# Patient Record
Sex: Male | Born: 1940 | Race: White | Hispanic: No | Marital: Single | Smoking: Former smoker
Health system: Southern US, Community
[De-identification: ages and names within clinical notes are randomized; demographics above are authoritative.]

## PROBLEM LIST (undated history)

## (undated) DIAGNOSIS — N289 Disorder of kidney and ureter, unspecified: Secondary | ICD-10-CM

## (undated) DIAGNOSIS — L219 Seborrheic dermatitis, unspecified: Secondary | ICD-10-CM

## (undated) DIAGNOSIS — I10 Essential (primary) hypertension: Secondary | ICD-10-CM

## (undated) DIAGNOSIS — I38 Endocarditis, valve unspecified: Secondary | ICD-10-CM

## (undated) DIAGNOSIS — F0390 Unspecified dementia without behavioral disturbance: Secondary | ICD-10-CM

## (undated) DIAGNOSIS — Z87891 Personal history of nicotine dependence: Secondary | ICD-10-CM

## (undated) DIAGNOSIS — R001 Bradycardia, unspecified: Secondary | ICD-10-CM

## (undated) DIAGNOSIS — E785 Hyperlipidemia, unspecified: Secondary | ICD-10-CM

## (undated) DIAGNOSIS — R55 Syncope and collapse: Secondary | ICD-10-CM

## (undated) HISTORY — DX: Disorder of kidney and ureter, unspecified: N28.9

## (undated) HISTORY — PX: MULTIPLE TOOTH EXTRACTIONS: SHX2053

## (undated) HISTORY — DX: Essential (primary) hypertension: I10

## (undated) HISTORY — PX: CARDIAC CATHETERIZATION: SHX172

## (undated) HISTORY — DX: Syncope and collapse: R55

## (undated) HISTORY — DX: Bradycardia, unspecified: R00.1

## (undated) HISTORY — DX: Personal history of nicotine dependence: Z87.891

## (undated) HISTORY — DX: Endocarditis, valve unspecified: I38

---

## 1999-09-08 ENCOUNTER — Ambulatory Visit (HOSPITAL_BASED_OUTPATIENT_CLINIC_OR_DEPARTMENT_OTHER): Admission: RE | Admit: 1999-09-08 | Discharge: 1999-09-09 | Payer: Self-pay | Admitting: Surgery

## 2000-10-03 ENCOUNTER — Encounter: Admission: RE | Admit: 2000-10-03 | Discharge: 2001-01-01 | Payer: Self-pay | Admitting: Anesthesiology

## 2001-01-19 ENCOUNTER — Encounter: Admission: RE | Admit: 2001-01-19 | Discharge: 2001-01-19 | Payer: Self-pay | Admitting: Internal Medicine

## 2008-03-04 ENCOUNTER — Encounter: Admission: RE | Admit: 2008-03-04 | Discharge: 2008-03-04 | Payer: Self-pay | Admitting: Internal Medicine

## 2010-03-11 ENCOUNTER — Encounter (INDEPENDENT_AMBULATORY_CARE_PROVIDER_SITE_OTHER): Payer: Self-pay | Admitting: Internal Medicine

## 2010-03-11 ENCOUNTER — Ambulatory Visit (HOSPITAL_COMMUNITY): Admission: RE | Admit: 2010-03-11 | Discharge: 2010-03-11 | Payer: Self-pay | Admitting: Internal Medicine

## 2010-03-11 ENCOUNTER — Ambulatory Visit: Payer: Self-pay | Admitting: Vascular Surgery

## 2010-06-23 ENCOUNTER — Encounter
Admission: RE | Admit: 2010-06-23 | Discharge: 2010-06-23 | Payer: Self-pay | Source: Home / Self Care | Attending: Internal Medicine | Admitting: Internal Medicine

## 2010-10-30 NOTE — H&P (Signed)
[facility]  Patient:    Cody Vincent, Cody Vincent                        MRN: [id number] Attending:  Thyra Breed, M.D. CC:         Candy Sledge, M.D.   History and Physical  NEW PATIENT EVALUATION  DATE OF BIRTH:  1940/12/02  HISTORY OF PRESENT ILLNESS:  Cody Vincent is a very pleasant 70 year old gentleman who is sent to [place] by Dr. Fransisca Connors for possible occipital nerve block.  The patient states that he was in his usual state of health up until about four years ago when he developed intermittent neck discomfort.  He described this as an achy discomfort which would come and go and would not recur for several years between bouts.  He originally stated it started after he took a fall in the bathtub related to what was described as "abdominal epilepsy".  He apparently passed out in the bathtub.  Over the past four to eight weeks, he has developed severe pain in his neck with no history of antecedent trauma.  It is associated with crepitus.  It is associated with a stiffness.  He notes that intermittently he has a dull ache throughout his scalp with associated shooting pains whenever he moves his head from side to side.  He denies any numbness or tingling in the lower extremities, but he does have numbness and tingling in the right upper extremity to a greater extent than the left upper extremity in a nondermatomal distribution.  He denied bowel or bladder incontinence.  He states when he gets up and walks sometimes he has poor balance.  He has had no more episodes of passing out since 1998.  Associated with his headache, he has neck discomfort.  He has no associated sweating of his forehead, eye injection, throbbing of the scalp of jaw mastication.  He no swallowing problems.  He denies any excess runny nose.  He notes it is made worse by rotating his head from side to side or flexing from an extended position or vice versa.  He does note that  Vicodin will help to a mild extent.  He has been tried on Neurontin which was not helpful, and he has tried Aleve as well as Ibuprofen with no benefits.  CURRENT MEDICATIONS:  Vicodin and he has been on Aleve as well as Ibuprofen in the past, and he is off of these now.  ALLERGIES:  NOVOCAINE causes "blackouts" as well as IVP DYE.  FAMILY HISTORY:  Positive for coronary artery disease, cancer, and osteoarthritis.  ACTIVE MEDICAL PROBLEMS:  Question of seizure disorder and recurrent bladder problems.  PAST SURGICAL HISTORY:  Significant for back surgery in 1985, hernia surgeries x 2, and a bladder surgery for ruptured cyst in the bladder.  SOCIAL HISTORY:  The patient is a nonsmoker and nondrinker.  He works at [employer].  REVIEW OF SYSTEMS:  HEAD:  Significant for sinus headaches in addition to the cervicogenic headaches, but no migraines.  EYES:  Positive for glasses. NOSE/MOUTH/THROAT:  Negative.  EARS:  Significant for decreased hearing acuity.  PULMONARY:  Negative.  CARDIOVASCULAR:  Negative.  GI:  Significant for gastroesophageal reflux disease, otherwise negative.  GU:  See active medical problems in past surgical history.  MUSCULOSKELETAL:  The patient has a history of carpal tunnel syndrome in the right upper extremity and tendonitis in the hands, otherwise negative.  NEUROLOGIC:  Positive  history of seizure disorder, negative for strokes.  HEMATOLOGIC:  Negative.  CUTANEOUS: Significant for a rash which occurred when his significant other changed detergents.  ENDOCRINE:  Negative.  PSYCHIATRIC:  Negative. ALLERGY/IMMUNOLOGIC:  Positive for hay fever.  PHYSICAL EXAMINATION:  VITAL SIGNS:  Blood pressure 150/71, heart rate 37, respiratory rate 18, and O2 saturation 100%.  Pain level 5/10.  GENERAL:  This is a pleasant male in no acute distress.  HEAD:  Normocephalic, atraumatic.  EYES:  Extraocular movements intact. Conjunctivae and sclerae clear.  NOSE:  Patent nares.   Oropharynx is free of lesions.  NECK:  Demonstrated mild restriction in range of motion with crepitus on range of motion.  Carotids are 2+ and symmetric without bruits.  LUNGS:  Clear.  HEART:  Regular rate and rhythm.  ABDOMINAL/GENITALIA/RECTAL:  Exams were not performed.  BACK EXAM:  Revealed well-healed surgical scar.  Straight leg raise signs were negative.  EXTREMITIES:  No clubbing, cyanosis, or edema with radial pulse and dorsalis pedis pulses 2+ and symmetric.  NEUROLOGIC:  The patient was oriented x 4.  Cranial nerves II-XII were grossly intact.  Deep tendon reflexes were symmetric in the upper extremities and lower extremities with downgoing toes.  Motor was 5/5 with symmetric bulk and tone.  Coordination was grossly intact.  Sensory exam was intact to pinprick and vibratory sense.  X-RAYS:  The patient had a rhythm strip run because of a slow heart rate.  His heart rate at rest is 40.  When he is asked to do stool x 5 his heart rate will go up to 68.  An MRI was interpreted by Dr. 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  IMPRESSION: 1. Neck pain and headaches on the basis of cervical spondylosis. 2. Bradycardia with first degree heart block which is responsive to exercise,    probably normal variant. 3. History of hay fever. 4. Questionable history of seizure disorder. 5. Bladder problem, poorly defined. 6. Decreased hearing acuity. 7. Mild gastroesophageal reflux disease.  DISPOSITION:  The patient describes a vague history of allergies to local anesthetics.  He cannot tell me who has given him local anesthetics in the past to check this out in greater detail.  I advised him that it was inadvisable to proceed with a nerve block today.  As an alternative, I advised him and his significant other as to how to ice down the occipital grooves to  numb  the back of the head.  They need to apply ice for 20 minutes each day and this may be as effective in putting the nerve in check and is well worth a trial.  In addition, I gave them some samples of Vioxx 25 mg 1 p.o. q.d. to try to see whether this will reduce some of the symptoms.  They plan to see Dr. Noreene Filbert back next month.  I encouraged them to establish with a primary care physician, but at this time I advised them that I would hold off any further injections until we can define the allergies to the local anesthetics in better detail.  There questions were answered.  They were advised to stop the Vioxx if they noted any problems with it. DD:  10/04/00 TD:  10/04/00 Job: [id number]

## 2010-10-30 NOTE — Op Note (Signed)
[facility]  Patient:    Cody Vincent, Cody Vincent                      MRN: [id number] Proc. Date: 09/08/99 Adm. Date:  [id number] Attending:  Abigail Miyamoto A                           Operative Report  PREOPERATIVE DIAGNOSIS:  Right inguinal hernia.  POSTOPERATIVE DIAGNOSIS:  Right inguinal hernia.  PROCEDURE:  Right inguinal hernia repair with mesh.  SURGEON:  Abigail Miyamoto, M.D.  ANESTHESIA:  General endotracheal anesthesia and 0.25% Marcaine plain.  ESTIMATED BLOOD LOSS:  Minimal.  DESCRIPTION OF PROCEDURE:  The patient was brought to the operating room and identified as [name].  He was placed supine on the operating room table nd general anesthesia was induced.  His right groin was then prepped and draped in the usual sterile fashion.  Using a #10 blade, a small longitudinal incision was made in the patients right lower abdomen.  The incision was carried down through the  Scarpas fascia with electrocautery.  The external oblique fascia was then identified, opened with the scalpel ________ toward the internal ring with the Metzenbaum scissors.  The testicular cord structures were then identified and controlled circumferentially with a Penrose drain.  The cord was then examined nd a large indirect inguinal hernia sac was identified.  The sac was separated from the remaining cord structures.  The sac was then opened.  No bowel was found to be protruding into the sac.  The base of the sac was then tied off with a 3-0 silk  pursestring suture.  The redundant remaining sac was then excised with the electrocautery.  Next, a 3 x 6 piece of Prolene mesh _________ fashion appropriately, and then split down the middle.  The mesh was then sewn in place to the pubic tubercle with two separate 2-0 Prolene sutures.  The mesh was then sewn in circumferentially around the testicular cord by sewing it to the transversalis fascia  medially and laterally to the shelving edge of the inguinal ligament, and then bringing it laterally around the cord structures.  Once the mesh was in place, the wound was then thoroughly irrigated with normal  saline.  Hemostasis appeared to be achieved.  The external oblique fascia was then closed with a running 2-0 Vicryl suture.  The Scarpas fascia was then reapproximated with 3-0 Vicryl sutures, and the skin was closed with a running  4-0 Vicryl sutures.  Steri-Strips, gauze, and Tegaderm were then applied.  The patient tolerated the procedure well.  All sponge, needle, and instrument counts were correct at the end of the procedure.  The patient was then extubated in the operating room and taken in stable condition to the recovery room. DD:  09/08/99 TD:  09/08/99 Job: [id number]

## 2012-03-05 DIAGNOSIS — G459 Transient cerebral ischemic attack, unspecified: Secondary | ICD-10-CM

## 2012-03-06 DIAGNOSIS — I498 Other specified cardiac arrhythmias: Secondary | ICD-10-CM

## 2012-03-06 DIAGNOSIS — G459 Transient cerebral ischemic attack, unspecified: Secondary | ICD-10-CM

## 2012-03-08 DIAGNOSIS — I498 Other specified cardiac arrhythmias: Secondary | ICD-10-CM

## 2012-03-20 ENCOUNTER — Ambulatory Visit (INDEPENDENT_AMBULATORY_CARE_PROVIDER_SITE_OTHER): Payer: PRIVATE HEALTH INSURANCE | Admitting: Internal Medicine

## 2012-03-20 ENCOUNTER — Encounter: Payer: Self-pay | Admitting: *Deleted

## 2012-03-20 ENCOUNTER — Encounter: Payer: Self-pay | Admitting: Internal Medicine

## 2012-03-20 VITALS — BP 130/50 | HR 43 | Ht 68.0 in | Wt 232.0 lb

## 2012-03-20 DIAGNOSIS — E782 Mixed hyperlipidemia: Secondary | ICD-10-CM

## 2012-03-20 DIAGNOSIS — I359 Nonrheumatic aortic valve disorder, unspecified: Secondary | ICD-10-CM

## 2012-03-20 DIAGNOSIS — I351 Nonrheumatic aortic (valve) insufficiency: Secondary | ICD-10-CM

## 2012-03-20 MED ORDER — ATORVASTATIN CALCIUM 20 MG PO TABS: 20.0000 mg | ORAL_TABLET | Freq: Every day | ORAL | Status: DC

## 2012-03-20 NOTE — Progress Notes (Signed)
HPI [place] admitted recently to [facility] hosp for TIA Patient at church  Legs felt weak.  Went to car.  Couldn't move legs, arms  Couldn't talk  Church member called 911 Patient taken to hosp  Moving a little. Not dizzy until started coming out of it patient says.  No episode like that in past.  At [facility],  echo and carotid USN done.  Echo showed mild to moderate AI.  Aortic root 41 mm  Mild MR  Mild LAE  Normal LVEF    Carotd USN showed mild plaquing seen in carotid artery  Per patient's report he had another spell in hospital.    LDL was 167; HDL 45.  SInce d/c no further spells. No Known Allergies  Current Outpatient Prescriptions  Medication Sig Dispense Refill  . aspirin 81 MG tablet Take 81 mg by mouth daily.      Marland Kitchen lisinopril-hydrochlorothiazide (PRINZIDE,ZESTORETIC) 10-12.5 MG per tablet Take 1 tablet by mouth daily.      . traMADol (ULTRAM) 50 MG tablet Take 50 mg by mouth every 6 (six) hours as needed.        No past medical history on file.  No past surgical history on file.  No family history on file.  History   Social History  . Marital Status: Single    Spouse Name: N/A    Number of Children: N/A  . Years of Education: N/A   Occupational History  . Not on file.   Social History Main Topics  . Smoking status: Current Every Day Smoker    Types: Cigarettes  . Smokeless tobacco: Not on file   Comment: use to smoke 3 packs per day for 6 years quit in 1993  . Alcohol Use: Not on file  . Drug Use: Not on file  . Sexually Active: Not on file   Other Topics Concern  . Not on file   Social History Narrative  . No narrative on file    Review of Systems:  All systems reviewed.  They are negative to the above problem except as previously stated.  Vital Signs: BP 130/50  Pulse 43  Ht 5\' 8"  (1.727 m)  Wt 232 lb (105.235 kg)  BMI 35.28 kg/m2  Physical Exam Patient is in NAD HEENT:  Normocephalic, atraumatic. EOMI, PERRLA.  Neck: JVP is normal.  No  bruits.  Lungs: clear to auscultation. No rales no wheezes.  Heart: Regular rate and rhythm. Normal S1, S2. No S3.   No significant murmurs. PMI not displaced.  Abdomen:  Supple, nontender. Normal bowel sounds. No masses. No hepatomegaly.  Extremities:   Good distal pulses throughout. No lower extremity edema.  Musculoskeletal :moving all extremities.  Neuro:   alert and oriented x3.  CN II-XII grossly intact.  EKG  Sinus bradycardia   43 bpm.   Assessment and Plan:  1. Question TIA>  Unusual  Was it a TIA?  Patient does not know if he has seen neuro or if he has  f/u in neuro.  WOrk up so far was negative so far.   WIll defer to primary to confirm f/u From cardiac standpoint found to be bradycardia.  But no evidence that symptomatic from this.  No other arrhythmia documented. Hemodynamics are good.   I would recomm following clinically.  I would not schedule further testing for now.  2.  Aortic insuff.  WIll need f/u  Repeat echo in 1 year to assess this as well as aortic root.  3.  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  Will begin Lipitor 20  F/U in 8 wks.  4.  HTN.  WIll check BMET   Continue meds.

## 2012-03-20 NOTE — Patient Instructions (Addendum)
Your physician recommends that you schedule a follow-up appointment in: 1 year  Your physician has requested that you have an echocardiogram, in 1 year, prior to your follow up visit. Echocardiography is a painless test that uses sound waves to create images of your heart. It provides your doctor with information about the size and shape of your heart and how well your heart's chambers and valves are working. This procedure takes approximately one hour. There are no restrictions for this procedure.  Your physician recommends that you return for lab work in: Tomorrow and in 2 months  Your physician has recommended you make the following change in your medication:  1 - START Lipitor 20 mg daily

## 2012-03-21 LAB — BASIC METABOLIC PANEL
BUN: 22 mg/dL (ref 6–23)
CO2: 27 mEq/L (ref 19–32)
Calcium: 9.3 mg/dL (ref 8.4–10.5)
Creat: 1.27 mg/dL (ref 0.50–1.35)
Glucose, Bld: 96 mg/dL (ref 70–99)

## 2012-03-23 ENCOUNTER — Telehealth: Payer: Self-pay | Admitting: *Deleted

## 2012-03-23 NOTE — Telephone Encounter (Signed)
Advised patient of lab results  

## 2012-03-23 NOTE — Telephone Encounter (Signed)
Message copied by Burnell Blanks on Thu Mar 23, 2012  8:58 AM ------      Message from: Dietrich Pates V      Created: Wed Mar 22, 2012  7:53 PM       Electrolytes are normal.

## 2012-05-01 ENCOUNTER — Encounter: Payer: Self-pay | Admitting: Internal Medicine

## 2012-05-15 ENCOUNTER — Other Ambulatory Visit: Payer: Self-pay | Admitting: *Deleted

## 2012-05-15 ENCOUNTER — Telehealth: Payer: Self-pay | Admitting: *Deleted

## 2012-05-15 DIAGNOSIS — E782 Mixed hyperlipidemia: Secondary | ICD-10-CM

## 2012-05-15 DIAGNOSIS — I351 Nonrheumatic aortic (valve) insufficiency: Secondary | ICD-10-CM

## 2012-05-15 LAB — AST: AST: 20 U/L (ref 0–37)

## 2012-05-15 NOTE — Telephone Encounter (Signed)
Received incoming call from [place] with [facility] to advise pt is in office for lab draw and no orders have been placed/brought in noted pt was advised by Tenny Craw to have labs approximately 05-22-12 for Lipid panal and AST, released orders and faxed times 2 to [facility], note reminder in chart to review that the collection/results were sent to MD for review.

## 2012-05-16 ENCOUNTER — Encounter: Payer: Self-pay | Admitting: *Deleted

## 2012-05-16 ENCOUNTER — Other Ambulatory Visit: Payer: Self-pay | Admitting: *Deleted

## 2012-05-16 DIAGNOSIS — E782 Mixed hyperlipidemia: Secondary | ICD-10-CM

## 2012-05-16 MED ORDER — EZETIMIBE-ATORVASTATIN 10-20 MG PO TABS: 1.0000 | ORAL_TABLET | Freq: Every morning | ORAL | Status: DC

## 2012-05-19 ENCOUNTER — Other Ambulatory Visit: Payer: Self-pay | Admitting: *Deleted

## 2012-05-19 ENCOUNTER — Encounter: Payer: Self-pay | Admitting: *Deleted

## 2012-05-19 DIAGNOSIS — E782 Mixed hyperlipidemia: Secondary | ICD-10-CM

## 2012-07-11 ENCOUNTER — Other Ambulatory Visit: Payer: Self-pay | Admitting: Internal Medicine

## 2012-07-12 LAB — LIPID PANEL
Cholesterol: 127 mg/dL (ref 0–200)
HDL: 39 mg/dL — ABNORMAL LOW (ref >39)
Triglycerides: 126 mg/dL (ref <150)

## 2012-07-12 LAB — AST: AST: 15 U/L (ref 0–37)

## 2012-07-21 ENCOUNTER — Other Ambulatory Visit: Payer: Self-pay | Admitting: *Deleted

## 2012-07-21 DIAGNOSIS — E785 Hyperlipidemia, unspecified: Secondary | ICD-10-CM

## 2012-07-21 MED ORDER — EZETIMIBE-ATORVASTATIN 10-10 MG PO TABS: 1.0000 | ORAL_TABLET | Freq: Every day | ORAL | Status: DC

## 2013-02-08 ENCOUNTER — Other Ambulatory Visit: Payer: Self-pay | Admitting: Internal Medicine

## 2013-02-18 DIAGNOSIS — I498 Other specified cardiac arrhythmias: Secondary | ICD-10-CM

## 2013-02-18 DIAGNOSIS — R079 Chest pain, unspecified: Secondary | ICD-10-CM

## 2013-02-19 DIAGNOSIS — R072 Precordial pain: Secondary | ICD-10-CM

## 2013-03-14 ENCOUNTER — Encounter: Payer: Self-pay | Admitting: Cardiology

## 2013-03-16 ENCOUNTER — Encounter: Payer: Self-pay | Admitting: *Deleted

## 2013-03-16 ENCOUNTER — Ambulatory Visit (INDEPENDENT_AMBULATORY_CARE_PROVIDER_SITE_OTHER): Payer: PRIVATE HEALTH INSURANCE | Admitting: Internal Medicine

## 2013-03-16 ENCOUNTER — Other Ambulatory Visit: Payer: Self-pay | Admitting: *Deleted

## 2013-03-16 ENCOUNTER — Encounter: Payer: Self-pay | Admitting: Internal Medicine

## 2013-03-16 ENCOUNTER — Ambulatory Visit (INDEPENDENT_AMBULATORY_CARE_PROVIDER_SITE_OTHER): Payer: PRIVATE HEALTH INSURANCE | Admitting: Cardiovascular Disease

## 2013-03-16 VITALS — BP 119/69 | HR 42 | Ht 68.0 in | Wt 215.0 lb

## 2013-03-16 DIAGNOSIS — I10 Essential (primary) hypertension: Secondary | ICD-10-CM

## 2013-03-16 DIAGNOSIS — R001 Bradycardia, unspecified: Secondary | ICD-10-CM | POA: Insufficient documentation

## 2013-03-16 DIAGNOSIS — R55 Syncope and collapse: Secondary | ICD-10-CM

## 2013-03-16 DIAGNOSIS — I359 Nonrheumatic aortic valve disorder, unspecified: Secondary | ICD-10-CM

## 2013-03-16 DIAGNOSIS — I498 Other specified cardiac arrhythmias: Secondary | ICD-10-CM

## 2013-03-16 DIAGNOSIS — I77810 Thoracic aortic ectasia: Secondary | ICD-10-CM

## 2013-03-16 DIAGNOSIS — I351 Nonrheumatic aortic (valve) insufficiency: Secondary | ICD-10-CM

## 2013-03-16 NOTE — Patient Instructions (Addendum)
Your physician recommends that you schedule a follow-up appointment in: 6 weeks. Your physician recommends that you continue on your current medications as directed. Please refer to the Current Medication list given to you today. Your physician has requested that you have an echocardiogram. Echocardiography is a painless test that uses sound waves to create images of your heart. It provides your doctor with information about the size and shape of your heart and how well your heart's chambers and valves are working. This procedure takes approximately one hour. There are no restrictions for this procedure. You were referred to Dr. Johney Frame today.

## 2013-03-16 NOTE — Patient Instructions (Addendum)
Your physician has recommended that you have a pacemaker inserted. A pacemaker is a small device that is placed under the skin of your chest or abdomen to help control abnormal heart rhythms. This device uses electrical pulses to prompt the heart to beat at a normal rate. Pacemakers are used to treat heart rhythms that are too slow. Wire (leads) are attached to the pacemaker that goes into the chambers of you heart. This is done in the hospital and usually requires and overnight stay. Please see the instruction sheet given to you today for more information. NO DRIVING AT ALL UNTIL FURTHER NOTICE.

## 2013-03-16 NOTE — Progress Notes (Signed)
Patient ID: Cody Vincent, male   DOB: Feb 11, 1941, 73 y.o.   MRN: [id number]      SUBJECTIVE: Cody Vincent was discharged from [facility] today, with a diagnosis of syncope due to symptomatic sinus bradycardia. It was recommended he have a pacemaker by the consulting cardiologist during his hospitalization. He has a questionable h/o a CVA (may have also been a pre-syncopal episode due to symptomatic sinus bradycardia, HR 37-42 bpm), HTN, and hyperlipidemia. He lives alone in a trailer. He had been walking and began to feel extremely weak, particularly in his legs, and then passed out. It was preceded by lightheadedness and dizziness. He doesn't know how long he was unconscious.  A friend tried to call him multiple times and when he didn't answer, he called the patient's pastor who attended to him. By the time he got to the ER, his HR ranged between 50-66 bpm, with an ECG revealing sinus bradycardia with an HR of 52 bpm. He ruled out for an ACS with serial troponins. He underwent a V/Q scan which was low probability for pulmonary embolus.  He recently had a stress test on 02-19-13 (which I interpreted) which revealed reverse redistribution of the inferior wall, indicative of soft tissue attenuation. There was also a small size, mild intensity, predominantly fixed mid-anterior wall defect with minimal reversibility, which could have represented scar with minimal peri-infarct ischemia vs motion and soft tissue attenuation artifact. Wall motion was normal which favored artifact rather than ischemia. LVEF was 59%.  An echocardiogram on 03-06-12 revealed normal LV systolic function, EF 60-65%, mild to moderate LVH, diastolic dysfunction, mild MR, mild to moderate AI, mild dilatation of the aortic root, and a small PFO.  He was evaluated by Dr. Antoine Poche at [facility] on 03-06-2012 for a near syncopal episode and was noted to be bradycardic at that time, but was able to increase his HR to the 60-70 bpm range doing exercises at  the bedside.  Today his HR is 42 bpm.    No Known Allergies  Current Outpatient Prescriptions  Medication Sig Dispense Refill  . aspirin 81 MG tablet Take 81 mg by mouth daily.      Marland Kitchen atorvastatin (LIPITOR) 10 MG tablet TAKE 1 TABLET DAILY  90 tablet  0  . lisinopril-hydrochlorothiazide (PRINZIDE,ZESTORETIC) 10-12.5 MG per tablet Take 1 tablet by mouth daily.      . tamsulosin (FLOMAX) 0.4 MG CAPS capsule Take 0.4 mg by mouth daily after supper.       . traMADol (ULTRAM) 50 MG tablet Take 50 mg by mouth every 6 (six) hours as needed.       No current facility-administered medications for this visit.    Past Medical History  Diagnosis Date  . Near syncope   . Sinus bradycardia   . Diastolic murmur   . Hypertension   . Renal insufficiency   . History of tobacco abuse     No past surgical history on file.  History   Social History  . Marital Status: Single    Spouse Name: N/A    Number of Children: N/A  . Years of Education: N/A   Occupational History  . Not on file.   Social History Main Topics  . Smoking status: Former Smoker -- 3.00 packs/day for 39 years    Types: Cigarettes    Quit date: 06/14/1996  . Smokeless tobacco: Not on file     Comment: use to smoke 3 packs per day for 6 years quit in 1993  .  Alcohol Use: Not on file  . Drug Use: Not on file  . Sexual Activity: Not on file   Other Topics Concern  . Not on file   Social History Narrative  . No narrative on file     Filed Vitals:   03/16/13 1119  BP: 119/69  Pulse: 42  Height: 5\' 8"  (1.727 m)  Weight: 215 lb (97.523 kg)    PHYSICAL EXAM General: NAD Neck: No JVD, no thyromegaly or thyroid nodule.  Lungs: Clear to auscultation bilaterally with normal respiratory effort. CV: Nondisplaced PMI.  Heart regular rhythm, HR 40 bpm, normal S1/S2, no S3/S4, II/IV holodiastolic murmur at LLSB.  No peripheral edema.  No carotid bruit.  Normal pedal pulses.  Abdomen: Soft, nontender, no  hepatosplenomegaly, no distention.  Neurologic: Alert and oriented x 3.  Psych: Normal affect. Extremities: No clubbing or cyanosis.   ECG: reviewed and available in electronic records.      ASSESSMENT AND PLAN: 1. Syncope in the setting of sinus bradycardia: I've done an extensive review of the medical records regarding both his hospitalizations from 02/2012 as well as his most recent one. He is not on any rate-slowing agents. His TSH and other pertinent labs were normal. His recent stress test showed no significant degree of ischemia. He has symptomatic sinus bradycardia and warrants pacemaker placement. I have discussed this with Dr. Johney Frame who is also here in [place] today, and he concurs, and has agreed to implant a pacemaker this Monday, 03/19/13. I will follow up with him thereafter. 2. Aortic regurgitation and aortic root dilatation: will obtain an echocardiogram for surveillance purposes. 3. Hyperlipidemia: on Lipitor 10 mg daily. Will check to see if lipids were recently done. 4. HTN: controlled on current therapy.   Prentice Docker, M.D., F.A.C.C.

## 2013-03-18 MED ORDER — SODIUM CHLORIDE 0.9 % IR SOLN
80.0000 mg | Status: DC
  Filled 2013-03-18 (×2): qty 2

## 2013-03-18 MED ORDER — SODIUM CHLORIDE 0.9 % IV SOLN
INTRAVENOUS | Status: DC
  Administered 2013-03-19: 08:00:00 via INTRAVENOUS

## 2013-03-18 MED ORDER — CHLORHEXIDINE GLUCONATE 4 % EX LIQD
60.0000 mL | Freq: Once | CUTANEOUS | Status: DC
  Filled 2013-03-18: qty 60

## 2013-03-18 MED ORDER — CEFAZOLIN SODIUM-DEXTROSE 2-3 GM-% IV SOLR
2.0000 g | INTRAVENOUS | Status: DC
  Filled 2013-03-18 (×2): qty 50

## 2013-03-19 ENCOUNTER — Encounter (HOSPITAL_COMMUNITY)
Admission: RE | Disposition: A | Discharge status: Home / Self Care | Payer: Self-pay | Source: Ambulatory Visit | Attending: Internal Medicine

## 2013-03-19 ENCOUNTER — Ambulatory Visit (HOSPITAL_COMMUNITY)
Admission: RE | Admit: 2013-03-19 | Discharge: 2013-03-20 | Disposition: A | Discharge status: Home / Self Care | Payer: PRIVATE HEALTH INSURANCE | Source: Ambulatory Visit | Attending: Internal Medicine | Admitting: Internal Medicine

## 2013-03-19 ENCOUNTER — Encounter (HOSPITAL_COMMUNITY): Payer: Self-pay | Admitting: Pharmacy Technician

## 2013-03-19 DIAGNOSIS — R001 Bradycardia, unspecified: Secondary | ICD-10-CM | POA: Diagnosis present

## 2013-03-19 DIAGNOSIS — I10 Essential (primary) hypertension: Secondary | ICD-10-CM | POA: Insufficient documentation

## 2013-03-19 DIAGNOSIS — E119 Type 2 diabetes mellitus without complications: Secondary | ICD-10-CM | POA: Insufficient documentation

## 2013-03-19 DIAGNOSIS — I495 Sick sinus syndrome: Secondary | ICD-10-CM

## 2013-03-19 DIAGNOSIS — Z79899 Other long term (current) drug therapy: Secondary | ICD-10-CM | POA: Insufficient documentation

## 2013-03-19 DIAGNOSIS — R55 Syncope and collapse: Secondary | ICD-10-CM | POA: Diagnosis present

## 2013-03-19 DIAGNOSIS — N289 Disorder of kidney and ureter, unspecified: Secondary | ICD-10-CM | POA: Insufficient documentation

## 2013-03-19 HISTORY — PX: PACEMAKER INSERTION: SHX728

## 2013-03-19 HISTORY — PX: PERMANENT PACEMAKER INSERTION: SHX5480

## 2013-03-19 LAB — CBC
HCT: 36.6 % — ABNORMAL LOW (ref 39.0–52.0)
Hemoglobin: 12.8 g/dL — ABNORMAL LOW (ref 13.0–17.0)
MCH: 30.5 pg (ref 26.0–34.0)
MCHC: 35 g/dL (ref 30.0–36.0)
Platelets: 171 10*3/uL (ref 150–400)
RBC: 4.2 MIL/uL — ABNORMAL LOW (ref 4.22–5.81)
RDW: 13.6 % (ref 11.5–15.5)

## 2013-03-19 LAB — BASIC METABOLIC PANEL
BUN: 29 mg/dL — ABNORMAL HIGH (ref 6–23)
Calcium: 9.1 mg/dL (ref 8.4–10.5)
GFR calc non Af Amer: 41 mL/min — ABNORMAL LOW (ref >90)
Glucose, Bld: 115 mg/dL — ABNORMAL HIGH (ref 70–99)
Potassium: 3.7 mEq/L (ref 3.5–5.1)
Sodium: 139 mEq/L (ref 135–145)

## 2013-03-19 LAB — SURGICAL PCR SCREEN: MRSA, PCR: NEGATIVE

## 2013-03-19 SURGERY — PERMANENT PACEMAKER INSERTION
Anesthesia: LOCAL

## 2013-03-19 MED ORDER — TAMSULOSIN HCL 0.4 MG PO CAPS
0.4000 mg | ORAL_CAPSULE | Freq: Every day | ORAL | Status: DC
  Administered 2013-03-19: 0.4 mg via ORAL
  Filled 2013-03-19 (×2): qty 1

## 2013-03-19 MED ORDER — SODIUM CHLORIDE 0.9 % IJ SOLN: 3.0000 mL | INTRAMUSCULAR | Status: DC | PRN

## 2013-03-19 MED ORDER — CEFAZOLIN SODIUM 1-5 GM-% IV SOLN
1.0000 g | Freq: Four times a day (QID) | INTRAVENOUS | Status: DC
  Filled 2013-03-19 (×2): qty 50

## 2013-03-19 MED ORDER — ATORVASTATIN CALCIUM 10 MG PO TABS
10.0000 mg | ORAL_TABLET | Freq: Every day | ORAL | Status: DC
  Administered 2013-03-19 – 2013-03-20 (×2): 10 mg via ORAL
  Filled 2013-03-19 (×2): qty 1

## 2013-03-19 MED ORDER — TRAMADOL HCL 50 MG PO TABS
50.0000 mg | ORAL_TABLET | ORAL | Status: DC | PRN
Start: 2013-03-19 — End: 2013-03-20

## 2013-03-19 MED ORDER — HEPARIN (PORCINE) IN NACL 2-0.9 UNIT/ML-% IJ SOLN
INTRAMUSCULAR | Status: AC
  Filled 2013-03-19: qty 500

## 2013-03-19 MED ORDER — SODIUM CHLORIDE 0.9 % IV SOLN: 250.0000 mL | INTRAVENOUS | Status: DC | PRN

## 2013-03-19 MED ORDER — MUPIROCIN 2 % EX OINT: TOPICAL_OINTMENT | Freq: Two times a day (BID) | CUTANEOUS | Status: DC

## 2013-03-19 MED ORDER — CEFAZOLIN SODIUM 1-5 GM-% IV SOLN
1.0000 g | Freq: Four times a day (QID) | INTRAVENOUS | Status: AC
  Administered 2013-03-19 – 2013-03-20 (×3): 1 g via INTRAVENOUS
  Filled 2013-03-19 (×3): qty 50

## 2013-03-19 MED ORDER — FENTANYL CITRATE 0.05 MG/ML IJ SOLN
INTRAMUSCULAR | Status: AC
  Filled 2013-03-19: qty 2

## 2013-03-19 MED ORDER — MIDAZOLAM HCL 5 MG/5ML IJ SOLN
INTRAMUSCULAR | Status: AC
  Filled 2013-03-19: qty 5

## 2013-03-19 MED ORDER — LISINOPRIL 10 MG PO TABS
10.0000 mg | ORAL_TABLET | Freq: Every day | ORAL | Status: DC
  Administered 2013-03-20: 10 mg via ORAL
  Filled 2013-03-19: qty 1

## 2013-03-19 MED ORDER — MUPIROCIN 2 % EX OINT
TOPICAL_OINTMENT | CUTANEOUS | Status: AC
  Filled 2013-03-19: qty 22

## 2013-03-19 MED ORDER — MUPIROCIN 2 % EX OINT
TOPICAL_OINTMENT | Freq: Two times a day (BID) | CUTANEOUS | Status: DC
  Administered 2013-03-19: 1 via NASAL
  Filled 2013-03-19: qty 22

## 2013-03-19 MED ORDER — ACETAMINOPHEN 325 MG PO TABS: 325.0000 mg | ORAL_TABLET | ORAL | Status: DC | PRN

## 2013-03-19 MED ORDER — HYDROCODONE-ACETAMINOPHEN 5-325 MG PO TABS: 1.0000 | ORAL_TABLET | ORAL | Status: DC | PRN

## 2013-03-19 MED ORDER — SODIUM CHLORIDE 0.9 % IJ SOLN: 3.0000 mL | Freq: Two times a day (BID) | INTRAMUSCULAR | Status: DC

## 2013-03-19 MED ORDER — LIDOCAINE HCL (PF) 1 % IJ SOLN
INTRAMUSCULAR | Status: AC
  Filled 2013-03-19: qty 60

## 2013-03-19 MED ORDER — ONDANSETRON HCL 4 MG/2ML IJ SOLN: 4.0000 mg | Freq: Four times a day (QID) | INTRAMUSCULAR | Status: DC | PRN

## 2013-03-19 NOTE — Op Note (Signed)
SURGEON:  Hillis Range, MD     PREPROCEDURE DIAGNOSIS:  Symptomatic sinus bradycardia/ sick sinus syndrome    POSTPROCEDURE DIAGNOSIS:    Symptomatic sinus bradycardia/ sick sinus syndrome     PROCEDURES:   1.   Pacemaker implantation.     INTRODUCTION:  Cody Vincent is a 72 y.o. male with a history of symptomatic sinus bradycardia/ sick sinus syndrome who presents today for pacemaker implantation.  The patient reports intermittent episodes of dizziness over the past few months.  He also reports fatigue with decreased exercise tolerance.  No reversible causes have been identified.  The patient therefore presents today for pacemaker implantation.     DESCRIPTION OF PROCEDURE:  Informed written consent was obtained, and  the patient was brought to the electrophysiology lab in a fasting state.  The patient received IV Versed and Fentanyl as sedation for the procedure today.  The patients left chest was prepped and draped in the usual sterile fashion by the EP lab staff. The skin overlying the left deltopectoral region was infiltrated with lidocaine for local analgesia.  A 4-cm incision was made over the left deltopectoral region.  A left subcutaneous pacemaker pocket was fashioned using a combination of sharp and blunt dissection. Electrocautery was required to assure hemostasis.    RA/RV Lead Placement: The left axillary vein was therefore cannulated.  No contrast was required for the procedure today. Through the left axillary vein, a [employer] model [id number] Tendril STS (serial number  [id number]) right atrial lead and a [employer] model [id number] Isoflex (serial number  [id number]) right ventricular lead were advanced with fluoroscopic visualization into the right atrial appendage and right ventricular apex positions respectively.  Initial atrial lead P- waves measured 1.8 mV with impedance of 663 ohms and a threshold of 1.0 V at 0.4 msec.  Right ventricular lead R-waves measured 6.1 mV with  an impedance of 909 ohms and a threshold of 0.4 V at 0.4 msec.  Both leads were secured to the pectoralis fascia using #2-0 silk over the suture sleeves.   Device Placement:  The leads were then connected to a [facility] Assurity DR  model [id number] (serial number  [id number]) pacemaker.  The pocket was irrigated with copious gentamicin solution.  The pacemaker was then placed into the pocket.  The pocket was then closed in 2 layers with 2.0 Vicryl suture for the subcutaneous and subcuticular layers.  Steri- strips and a sterile dressing were then applied.  There were no early apparent complications.     CONCLUSIONS:   1. Successful implantation of a [employer] Assurity DR  dual-chamber pacemaker for symptomatic sinus bradycardia  2. No early apparent complications.     Hillis Range, MD 03/19/2013 11:50 AM

## 2013-03-19 NOTE — Discharge Summary (Signed)
ELECTROPHYSIOLOGY PROCEDURE DISCHARGE SUMMARY    Patient ID: Cody Vincent,  MRN: [id number], DOB/AGE: January 28, 1941 72 y.o.  Admit date: 03/19/2013 Discharge date: 03/20/2013  Primary Care Physician: Loreen Freud, MD Primary Cardiologist: Purvis Sheffield  Primary Discharge Diagnosis:  Symptomatic bradycardia status post pacemaker implantation this admission  Secondary Discharge Diagnosis:  1.  Hypertension 2.  Renal insufficiency 3.  Near syncope  Procedures This Admission:  1.  Implantation of a dual chamber pacemaker on 03-19-2013 by Dr Johney Frame.  The patient received a STJ Assurity pacemaker with model number 2088 right atrial lead and model number 1948 right ventricular lead.  There were no early apparent complications. 2.  CXR on 03-20-2013 demonstrated no ptx and stable leads.  Brief HPI: [place] is a 72 y.o. male with a history of symptomatic sinus bradycardia/ sick sinus syndrome who presents today for pacemaker implantation. The patient reports intermittent episodes of dizziness over the past few months. He also reports fatigue with decreased exercise tolerance. No reversible causes have been identified. The patient therefore presents today for pacemaker implantation.   Hospital Course:  The patient was admitted and underwent implantation of a STJ pacemaker with details as outlined above.   He was monitored on telemetry overnight which demonstrated atrial pacing with intrinsic ventricular conduction.  Left chest was without hematoma or ecchymosis.  The device was interrogated and found to be functioning normally.  CXR was obtained and demonstrated no pneumothorax status post device implantation.  Wound care, arm mobility, and restrictions were reviewed with the patient.  Dr Johney Frame examined the patient and considered them stable for discharge to home.   Physical Exam: Filed Vitals:   03/19/13 1337 03/19/13 2011 03/20/13 0004 03/20/13 0430  BP: 114/83 126/64 103/52 124/65    Pulse: 80 66 67 63  Temp: 97.4 F (36.3 C) 98.9 F (37.2 C) 98.8 F (37.1 C) 98.2 F (36.8 C)  TempSrc: Oral  Oral Oral  Resp: 19 16 18 18   Height:      Weight:      SpO2: 98% 98% 96% 98%    GEN- The patient is well appearing, alert and oriented x 3 today.   Head- normocephalic, atraumatic Eyes-  Sclera clear, conjunctiva pink Ears- hearing intact Oropharynx- clear Neck- supple, no JVP Lymph- no cervical lymphadenopathy Lungs- Clear to ausculation bilaterally, normal work of breathing Chest- pacemaker pocket is without hematoma Heart- Regular rate and rhythm, no murmurs, rubs or gallops, PMI not laterally displaced GI- soft, NT, ND, + BS Extremities- no clubbing, cyanosis, or edema Neuro- strength and sensation are intact  Pacemaker interrogation- reviewed in detail today,  See PACEART report  Labs:   Lab Results  Component Value Date   WBC 9.6 03/19/2013   HGB 12.8* 03/19/2013   HCT 36.6* 03/19/2013   MCV 87.1 03/19/2013   PLT 171 03/19/2013     Recent Labs Lab 03/20/13 0550  NA 139  K 3.7  CL 105  CO2 25  BUN 24*  CREATININE 1.17  CALCIUM 8.9  GLUCOSE 117*    Discharge Medications:    Medication List    STOP taking these medications       lisinopril-hydrochlorothiazide 10-12.5 MG per tablet  Commonly known as:  PRINZIDE,ZESTORETIC      TAKE these medications       aspirin EC 81 MG tablet  Take 81 mg by mouth daily.     atorvastatin 10 MG tablet  Commonly known as:  LIPITOR  Take  10 mg by mouth daily.     lisinopril 10 MG tablet  Commonly known as:  PRINIVIL  Take 1 tablet (10 mg total) by mouth daily.     tamsulosin 0.4 MG Caps capsule  Commonly known as:  FLOMAX  Take 0.4 mg by mouth daily after supper.     traMADol 50 MG tablet  Commonly known as:  ULTRAM  Take 50 mg by mouth as needed.        Disposition:   Future Appointments Provider Department Dept Phone   03/26/2013 10:15 AM Hillis Range, MD [facility]  [facility] [phone]   03/29/2013 10:00 AM [facility] [phone]   04/27/2013 9:00 AM Laqueta Linden, MD [facility] [phone]     Stop hctz.   Duration of Discharge Encounter: Greater than 30 minutes including physician time.  Signed,  Hillis Range MD

## 2013-03-19 NOTE — Interval H&P Note (Signed)
History and Physical Interval Note:  03/19/2013 8:10 AM  [place]  has presented today for surgery, with the diagnosis of Heart failure  The various methods of treatment have been discussed with the patient and family. After consideration of risks, benefits and other options for treatment, the patient has consented to  Procedure(s): PERMANENT PACEMAKER INSERTION (N/A) as a surgical intervention .  The patient's history has been reviewed, patient examined, no change in status, stable for surgery.  I have reviewed the patient's chart and labs.  Questions were answered to the patient's satisfaction.     Hillis Range

## 2013-03-19 NOTE — Progress Notes (Signed)
Primary Care Physician: Ignatius Specking., MD Referring Physician:  Dr Raeanne Barry is a 72 y.o. male with a h/o DM, HTN, and recurrent dizziness/ presyncope who presents for Ep consultation regarding symptomatic sinus bradycardia.  He has been hospitalized for symptomatic bradycardia and syncope at [facility].  He was referred to Dr Purvis Sheffield in follow-up.  PPM has been advised.  He reports having several prior presyncopal events and has chronic bradycardia with HR 30s/40s.  He reports fatigue and decreased exercise tolerance chronically.  He recently had a stress test on 02-19-13 which revealed reverse redistribution of the inferior wall, indicative of soft tissue attenuation. There was also a small size, mild intensity, predominantly fixed mid-anterior wall defect with minimal reversibility, which could have represented scar with minimal peri-infarct ischemia vs motion and soft tissue attenuation artifact. Wall motion was normal which favored artifact rather than ischemia. LVEF was 59%.  Today, he denies symptoms of palpitations, chest pain, shortness of breath, orthopnea, PND, lower extremity edema, or other concerns. The patient is tolerating medications without difficulties and is otherwise without complaint today.   Past Medical History  Diagnosis Date  . Near syncope   . Sinus bradycardia   . Diastolic murmur   . Hypertension   . Renal insufficiency   . History of tobacco abuse    NKDA  medicines are reviewed   No Known Allergies  History   Social History  . Marital Status: Single    Spouse Name: N/A    Number of Children: N/A  . Years of Education: N/A   Occupational History  . Not on file.   Social History Main Topics  . Smoking status: Former Smoker -- 3.00 packs/day for 39 years    Types: Cigarettes    Quit date: 06/14/1996  . Smokeless tobacco: Not on file     Comment: use to smoke 3 packs per day for 6 years quit in 1993  . Alcohol Use: Not on file    . Drug Use: Not on file  . Sexual Activity: Not on file   Other Topics Concern  . Not on file   Social History Narrative  . No narrative on file   FH- CAD  ROS- All systems are reviewed and negative except as per the HPI above  Physical Exam: Filed Vitals:   03/16/13 1225  BP: 119/69  Pulse: 42  Height: 5\' 8"  (1.727 m)  Weight: 215 lb (97.523 kg)    GEN- The patient is overweight appearing, alert and oriented x 3 today.   Head- normocephalic, atraumatic Eyes-  Sclera clear, conjunctiva pink Ears- hearing intact Oropharynx- clear Neck- supple, no JVP Lymph- no cervical lymphadenopathy Lungs- Clear to ausculation bilaterally, normal work of breathing Heart- Regular rate and rhythm, no murmurs, rubs or gallops, PMI not laterally displaced GI- soft, NT, ND, + BS Extremities- no clubbing, cyanosis, or edema MS- no significant deformity or atrophy Skin- no rash or lesion Psych- euthymic mood, full affect Neuro- strength and sensation are intact  EKG 03/26/12 reveals sinus bradycardia, rsr'  Assessment and Plan:  1. Sick sinus syndrome The patient has symptomatic sinus bradycardia. No reversible causes have been identified.  I would therefore recommend pacemaker implantation at this time.  Risks, benefits, alternatives to pacemaker implantation were discussed in detail with the patient today. The patient understands that the risks include but are not limited to bleeding, infection, pneumothorax, perforation, tamponade, vascular damage, renal failure, MI, stroke, death,  and lead dislodgement and  wishes to proceed. We will therefore schedule the procedure at the next available time.  2. Syncope Likely due to bradycardia EF is preserved No driving x 6 months (pt is aware)  3. HTN Stable No change required today

## 2013-03-19 NOTE — H&P (View-Only) (Signed)
Primary Care Physician: VYAS,DHRUV B., MD Referring Physician:  Dr Koneswaran   [place] is a 72 y.o. male with a h/o DM, HTN, and recurrent dizziness/ presyncope who presents for Ep consultation regarding symptomatic sinus bradycardia.  He has been hospitalized for symptomatic bradycardia and syncope at [facility].  He was referred to Dr Koneswaran in follow-up.  PPM has been advised.  He reports having several prior presyncopal events and has chronic bradycardia with HR 30s/40s.  He reports fatigue and decreased exercise tolerance chronically.  He recently had a stress test on 02-19-13 which revealed reverse redistribution of the inferior wall, indicative of soft tissue attenuation. There was also a small size, mild intensity, predominantly fixed mid-anterior wall defect with minimal reversibility, which could have represented scar with minimal peri-infarct ischemia vs motion and soft tissue attenuation artifact. Wall motion was normal which favored artifact rather than ischemia. LVEF was 59%.  Today, he denies symptoms of palpitations, chest pain, shortness of breath, orthopnea, PND, lower extremity edema, or other concerns. The patient is tolerating medications without difficulties and is otherwise without complaint today.   Past Medical History  Diagnosis Date  . Near syncope   . Sinus bradycardia   . Diastolic murmur   . Hypertension   . Renal insufficiency   . History of tobacco abuse    NKDA  medicines are reviewed   No Known Allergies  History   Social History  . Marital Status: Single    Spouse Name: N/A    Number of Children: N/A  . Years of Education: N/A   Occupational History  . Not on file.   Social History Main Topics  . Smoking status: Former Smoker -- 3.00 packs/day for 39 years    Types: Cigarettes    Quit date: 06/14/1996  . Smokeless tobacco: Not on file     Comment: use to smoke 3 packs per day for 6 years quit in 1993  . Alcohol Use: Not on file    . Drug Use: Not on file  . Sexual Activity: Not on file   Other Topics Concern  . Not on file   Social History Narrative  . No narrative on file   FH- CAD  ROS- All systems are reviewed and negative except as per the HPI above  Physical Exam: Filed Vitals:   03/16/13 1225  BP: 119/69  Pulse: 42  Height: 5' 8" (1.727 m)  Weight: 215 lb (97.523 kg)    GEN- The patient is overweight appearing, alert and oriented x 3 today.   Head- normocephalic, atraumatic Eyes-  Sclera clear, conjunctiva pink Ears- hearing intact Oropharynx- clear Neck- supple, no JVP Lymph- no cervical lymphadenopathy Lungs- Clear to ausculation bilaterally, normal work of breathing Heart- Regular rate and rhythm, no murmurs, rubs or gallops, PMI not laterally displaced GI- soft, NT, ND, + BS Extremities- no clubbing, cyanosis, or edema MS- no significant deformity or atrophy Skin- no rash or lesion Psych- euthymic mood, full affect Neuro- strength and sensation are intact  EKG 03/26/12 reveals sinus bradycardia, rsr'  Assessment and Plan:  1. Sick sinus syndrome The patient has symptomatic sinus bradycardia. No reversible causes have been identified.  I would therefore recommend pacemaker implantation at this time.  Risks, benefits, alternatives to pacemaker implantation were discussed in detail with the patient today. The patient understands that the risks include but are not limited to bleeding, infection, pneumothorax, perforation, tamponade, vascular damage, renal failure, MI, stroke, death,  and lead dislodgement and   wishes to proceed. We will therefore schedule the procedure at the next available time.  2. Syncope Likely due to bradycardia EF is preserved No driving x 6 months (pt is aware)  3. HTN Stable No change required today   

## 2013-03-20 ENCOUNTER — Encounter (HOSPITAL_COMMUNITY): Payer: Self-pay

## 2013-03-20 ENCOUNTER — Ambulatory Visit (HOSPITAL_COMMUNITY): Payer: PRIVATE HEALTH INSURANCE

## 2013-03-20 DIAGNOSIS — I495 Sick sinus syndrome: Secondary | ICD-10-CM

## 2013-03-20 LAB — BASIC METABOLIC PANEL
BUN: 24 mg/dL — ABNORMAL HIGH (ref 6–23)
CO2: 25 mEq/L (ref 19–32)
Calcium: 8.9 mg/dL (ref 8.4–10.5)
Chloride: 105 mEq/L (ref 96–112)
Creatinine, Ser: 1.17 mg/dL (ref 0.50–1.35)
GFR calc Af Amer: 71 mL/min — ABNORMAL LOW (ref >90)
Glucose, Bld: 117 mg/dL — ABNORMAL HIGH (ref 70–99)
Potassium: 3.7 mEq/L (ref 3.5–5.1)
Sodium: 139 mEq/L (ref 135–145)

## 2013-03-20 MED ORDER — LISINOPRIL 10 MG PO TABS: 10.0000 mg | ORAL_TABLET | Freq: Every day | ORAL | Status: DC

## 2013-03-26 ENCOUNTER — Ambulatory Visit (INDEPENDENT_AMBULATORY_CARE_PROVIDER_SITE_OTHER): Payer: PRIVATE HEALTH INSURANCE | Admitting: Internal Medicine

## 2013-03-26 ENCOUNTER — Encounter: Payer: Self-pay | Admitting: Internal Medicine

## 2013-03-26 DIAGNOSIS — R001 Bradycardia, unspecified: Secondary | ICD-10-CM

## 2013-03-26 DIAGNOSIS — I498 Other specified cardiac arrhythmias: Secondary | ICD-10-CM

## 2013-03-26 LAB — PACEMAKER DEVICE OBSERVATION
AL IMPEDENCE PM: 512.5 Ohm
AL THRESHOLD: 1.25 V
BAMS-0003: 70 {beats}/min
DEVICE MODEL PM: 7536042
RV LEAD AMPLITUDE: 6.6 mv
RV LEAD IMPEDENCE PM: 750 Ohm
RV LEAD THRESHOLD: 0.5 V

## 2013-03-26 NOTE — Progress Notes (Signed)
Wound check performed Pacemaker pocket is healed and steristrips are removed today  He reports significant improvement in energy and exercise tolerance with pacing.  Return for routine follow-up with me in 3 months

## 2013-03-29 ENCOUNTER — Ambulatory Visit: Payer: PRIVATE HEALTH INSURANCE

## 2013-03-29 ENCOUNTER — Other Ambulatory Visit (INDEPENDENT_AMBULATORY_CARE_PROVIDER_SITE_OTHER): Payer: PRIVATE HEALTH INSURANCE

## 2013-03-29 ENCOUNTER — Other Ambulatory Visit: Payer: Self-pay

## 2013-03-29 ENCOUNTER — Encounter: Payer: Self-pay | Admitting: *Deleted

## 2013-03-29 DIAGNOSIS — I498 Other specified cardiac arrhythmias: Secondary | ICD-10-CM

## 2013-03-29 DIAGNOSIS — I359 Nonrheumatic aortic valve disorder, unspecified: Secondary | ICD-10-CM

## 2013-03-29 DIAGNOSIS — I351 Nonrheumatic aortic (valve) insufficiency: Secondary | ICD-10-CM

## 2013-03-29 DIAGNOSIS — R001 Bradycardia, unspecified: Secondary | ICD-10-CM

## 2013-04-24 ENCOUNTER — Encounter: Payer: Self-pay | Admitting: Internal Medicine

## 2013-04-27 ENCOUNTER — Ambulatory Visit (INDEPENDENT_AMBULATORY_CARE_PROVIDER_SITE_OTHER): Payer: PRIVATE HEALTH INSURANCE | Admitting: Cardiovascular Disease

## 2013-04-27 ENCOUNTER — Encounter: Payer: Self-pay | Admitting: Cardiovascular Disease

## 2013-04-27 VITALS — BP 152/63 | HR 62 | Ht 68.0 in | Wt 210.0 lb

## 2013-04-27 DIAGNOSIS — Z950 Presence of cardiac pacemaker: Secondary | ICD-10-CM

## 2013-04-27 DIAGNOSIS — I351 Nonrheumatic aortic (valve) insufficiency: Secondary | ICD-10-CM

## 2013-04-27 DIAGNOSIS — I359 Nonrheumatic aortic valve disorder, unspecified: Secondary | ICD-10-CM

## 2013-04-27 DIAGNOSIS — Z91148 Patient's other noncompliance with medication regimen for other reason: Secondary | ICD-10-CM

## 2013-04-27 DIAGNOSIS — I77819 Aortic ectasia, unspecified site: Secondary | ICD-10-CM

## 2013-04-27 DIAGNOSIS — Z9114 Patient's other noncompliance with medication regimen: Secondary | ICD-10-CM

## 2013-04-27 DIAGNOSIS — I10 Essential (primary) hypertension: Secondary | ICD-10-CM

## 2013-04-27 DIAGNOSIS — E785 Hyperlipidemia, unspecified: Secondary | ICD-10-CM

## 2013-04-27 DIAGNOSIS — Z9119 Patient's noncompliance with other medical treatment and regimen: Secondary | ICD-10-CM

## 2013-04-27 MED ORDER — LISINOPRIL 10 MG PO TABS: 10.0000 mg | ORAL_TABLET | Freq: Every day | ORAL | Status: DC

## 2013-04-27 MED ORDER — ATORVASTATIN CALCIUM 10 MG PO TABS: 10.0000 mg | ORAL_TABLET | Freq: Every evening | ORAL | Status: DC

## 2013-04-27 NOTE — Patient Instructions (Signed)
   CT scan of chest  Office will contact with results via phone or letter.   Begin Lipitor 10mg  every evening - new sent to pharm  Begin Lisinopril 10mg  daily - new sent to pharm  Continue all other current medications. Your physician wants you to follow up in: 6 months.  You will receive a reminder letter in the mail one-two months in advance.  If you don't receive a letter, please call our office to schedule the follow up appointment

## 2013-04-27 NOTE — Progress Notes (Signed)
Patient ID: Cody Vincent, male   DOB: 1940-11-14, 72 y.o.   MRN: [id number]      SUBJECTIVE: Cody Vincent underwent pacemaker placement for symptomatic sinus bradycardia and feels much better now. He denies chest pain and shortness of breath. He continued to experience lightheadedness after taking his medications (Prinzide, tamsulosin, Lipitor), so he stopped all of them and only takes a baby ASA.    No Known Allergies  Current Outpatient Prescriptions  Medication Sig Dispense Refill  . aspirin EC 81 MG tablet Take 81 mg by mouth daily.       No current facility-administered medications for this visit.    Past Medical History  Diagnosis Date  . Near syncope   . Sinus bradycardia   . Diastolic murmur   . Hypertension   . Renal insufficiency   . History of tobacco abuse     Past Surgical History  Procedure Laterality Date  . Pacemaker insertion  03/19/13    SJM pacemaker implanted for symptomatic sinus bradycardia by Dr Johney Frame    History   Social History  . Marital Status: Single    Spouse Name: N/A    Number of Children: N/A  . Years of Education: N/A   Occupational History  . Not on file.   Social History Main Topics  . Smoking status: Former Smoker -- 3.00 packs/day for 39 years    Types: Cigarettes    Quit date: 06/14/1996  . Smokeless tobacco: Never Used     Comment: use to smoke 3 packs per day for 6 years quit in 1993  . Alcohol Use: No  . Drug Use: No  . Sexual Activity: No   Other Topics Concern  . Not on file   Social History Narrative  . No narrative on file     Filed Vitals:   04/27/13 0833  BP: 152/63  Pulse: 62  Height: 5\' 8"  (1.727 m)  Weight: 210 lb (95.255 kg)  SpO2: 98%    PHYSICAL EXAM General: NAD  Neck: No JVD, no thyromegaly or thyroid nodule.  Lungs: Clear to auscultation bilaterally with normal respiratory effort.  CV: Nondisplaced PMI. Well healed pacemaker incision. Heart regular rhythm, normal S1/S2, no S3/S4, II/IV  holodiastolic murmur at LLSB. No peripheral edema. No carotid bruit. Normal pedal pulses.  Abdomen: Soft, nontender, no hepatosplenomegaly, no distention.  Neurologic: Alert and oriented x 3.  Psych: Normal affect.  Extremities: No clubbing or cyanosis.    ECG: reviewed and available in electronic records.   - Left ventricle: The cavity size was normal. End-systolic dimension is 2.42 cm. There was moderate basal septal hypertrophy. Systolic function was normal. The estimated ejection fraction was in the range of 55% to 60%. There was an increased relative contribution of atrial contraction to ventricular filling. Doppler parameters are consistent with abnormal left ventricular relaxation (grade 1 diastolic dysfunction). - Ventricular septum: Septal motion showed paradox. These changes are consistent with right ventricular pacing. - Aortic valve: Mildly thickened leaflets. There was no stenosis. At least moderate regurgitation, bordering on severe. Normal LV dimensions. - Aorta: Aortic root dimension: 47mm (ED). - Aortic root: The aortic root was mildly dilated. - Mitral valve: Mildly thickened leaflets . Trivial regurgitation. - Left atrium: The atrium was mildly dilated. - Right ventricle: Pacer wire or catheter noted in right ventricle. - Right atrium: Pacer wire or catheter noted in right atrium. - Systemic veins: A gallbladder stone is visualized.     ASSESSMENT AND PLAN: 1. Aortic regurgitation:  moderate to severe. Will monitor with serial echocardiogram to assess for change in systolic function and LV dimensions. Aim to control BP. 2. Symptomatic sinus bradycardia s/p pacemaker: follows up with Dr. Johney Frame. 3. Mild aortic root dilatation: will obtain a chest CT to evaluate the extent of his aortic enlargement. 4. Hyperlipidemia: had been on Lipitor 10 mg daily, which I've encouraged him to begin taking again. 5. HTN: uncontrolled without meds, thus I've asked him to  start lisinopril 10 mg daily. 6. Dizziness: possibly secondary to a combination of both tamsulosin along with Prinzide. Thus, I've asked him to only take lisinopril 10 mg daily.  Dispo: f/u 6 months.   Prentice Docker, M.D., F.A.C.C.

## 2013-05-04 ENCOUNTER — Ambulatory Visit (HOSPITAL_COMMUNITY)
Admission: RE | Admit: 2013-05-04 | Discharge: 2013-05-04 | Disposition: A | Discharge status: Home / Self Care | Payer: PRIVATE HEALTH INSURANCE | Source: Ambulatory Visit | Attending: Cardiovascular Disease | Admitting: Cardiovascular Disease

## 2013-05-04 DIAGNOSIS — I77819 Aortic ectasia, unspecified site: Secondary | ICD-10-CM

## 2013-05-07 ENCOUNTER — Ambulatory Visit (HOSPITAL_COMMUNITY)
Admission: RE | Admit: 2013-05-07 | Discharge: 2013-05-07 | Disposition: A | Discharge status: Home / Self Care | Payer: PRIVATE HEALTH INSURANCE | Source: Ambulatory Visit | Attending: Cardiovascular Disease | Admitting: Cardiovascular Disease

## 2013-05-07 ENCOUNTER — Other Ambulatory Visit: Payer: Self-pay | Admitting: Cardiovascular Disease

## 2013-05-07 DIAGNOSIS — R079 Chest pain, unspecified: Secondary | ICD-10-CM | POA: Insufficient documentation

## 2013-05-07 DIAGNOSIS — M25519 Pain in unspecified shoulder: Secondary | ICD-10-CM | POA: Insufficient documentation

## 2013-05-07 DIAGNOSIS — I77819 Aortic ectasia, unspecified site: Secondary | ICD-10-CM

## 2013-05-07 LAB — POCT I-STAT CREATININE: Creatinine, Ser: 1.3 mg/dL (ref 0.50–1.35)

## 2013-05-07 MED ORDER — IOHEXOL 350 MG/ML SOLN
100.0000 mL | Freq: Once | INTRAVENOUS | Status: AC | PRN
  Administered 2013-05-07: 100 mL via INTRAVENOUS

## 2013-05-08 ENCOUNTER — Telehealth: Payer: Self-pay | Admitting: *Deleted

## 2013-05-08 NOTE — Telephone Encounter (Signed)
Message copied by Lesle Chris on Tue May 08, 2013 10:29 AM ------      Message from: Prentice Docker A      Created: Mon May 07, 2013 11:59 AM       Noted. Please inform pt that aorta does not look to be aneurysmal. ------

## 2013-05-08 NOTE — Telephone Encounter (Signed)
Notes Recorded by Lesle Chris, LPN on 09/60/4540 at 10:29 AM Patient notified and verbalized understanding.

## 2013-06-14 HISTORY — PX: KIDNEY STONE SURGERY: SHX686

## 2013-06-24 ENCOUNTER — Encounter: Payer: Self-pay | Admitting: Internal Medicine

## 2013-06-29 ENCOUNTER — Encounter: Payer: PRIVATE HEALTH INSURANCE | Admitting: Internal Medicine

## 2013-07-17 ENCOUNTER — Telehealth: Payer: Self-pay | Admitting: Cardiovascular Disease

## 2013-07-17 NOTE — Telephone Encounter (Signed)
Patient came in moving to [place]. Will contact us with new address when he gets one.   He will be contacting [place] office to reschedule with [facility]

## 2013-08-22 ENCOUNTER — Ambulatory Visit (INDEPENDENT_AMBULATORY_CARE_PROVIDER_SITE_OTHER): Payer: PRIVATE HEALTH INSURANCE | Admitting: Internal Medicine

## 2013-08-22 ENCOUNTER — Encounter: Payer: Self-pay | Admitting: Internal Medicine

## 2013-08-22 VITALS — BP 123/72 | HR 69 | Ht 68.0 in | Wt 192.8 lb

## 2013-08-22 DIAGNOSIS — R519 Headache, unspecified: Secondary | ICD-10-CM

## 2013-08-22 DIAGNOSIS — R55 Syncope and collapse: Secondary | ICD-10-CM

## 2013-08-22 DIAGNOSIS — I498 Other specified cardiac arrhythmias: Secondary | ICD-10-CM

## 2013-08-22 DIAGNOSIS — I10 Essential (primary) hypertension: Secondary | ICD-10-CM

## 2013-08-22 DIAGNOSIS — R51 Headache: Secondary | ICD-10-CM

## 2013-08-22 DIAGNOSIS — R42 Dizziness and giddiness: Secondary | ICD-10-CM

## 2013-08-22 DIAGNOSIS — R001 Bradycardia, unspecified: Secondary | ICD-10-CM

## 2013-08-22 DIAGNOSIS — Z950 Presence of cardiac pacemaker: Secondary | ICD-10-CM

## 2013-08-22 LAB — MDC_IDC_ENUM_SESS_TYPE_INCLINIC
Brady Statistic RV Percent Paced: 0.17 %
Date Time Interrogation Session: 20150311131151
Implantable Pulse Generator Model: 2240
Implantable Pulse Generator Serial Number: 7536042
Lead Channel Impedance Value: 712.5 Ohm
Lead Channel Pacing Threshold Pulse Width: 0.4 ms
Lead Channel Sensing Intrinsic Amplitude: 1.9 mV
Lead Channel Sensing Intrinsic Amplitude: 9.3 mV
Lead Channel Setting Pacing Amplitude: 2 V
Lead Channel Setting Sensing Sensitivity: 2 mV
MDC IDC MSMT BATTERY REMAINING LONGEVITY: 123.6 mo
MDC IDC MSMT BATTERY VOLTAGE: 2.99 V
MDC IDC MSMT LEADCHNL RA IMPEDANCE VALUE: 512.5 Ohm
MDC IDC MSMT LEADCHNL RA PACING THRESHOLD AMPLITUDE: 0.75 V
MDC IDC MSMT LEADCHNL RA PACING THRESHOLD PULSEWIDTH: 0.4 ms
MDC IDC MSMT LEADCHNL RV PACING THRESHOLD AMPLITUDE: 0.75 V
MDC IDC SET LEADCHNL RV PACING AMPLITUDE: 3.5 V
MDC IDC SET LEADCHNL RV PACING PULSEWIDTH: 0.4 ms
MDC IDC STAT BRADY RA PERCENT PACED: 95 %

## 2013-08-22 NOTE — Patient Instructions (Addendum)
   Remote check - 11/2013 - [facility] when you are able to hook up your box.  You may ask for Belenda CruiseKristin & she can help you with set up.  [phone]  Continue all current medications. [facility] in  [facility] in [place] - Dr. Johney FrameAllred  Labs for BMET, CBC - order given today Office will contact with results via phone or letter.   Dr. Johney FrameAllred advised daughter to follow up with Dr. Sherril CroonVyas regarding headache, dizziness

## 2013-08-22 NOTE — Progress Notes (Signed)
PCP: Cody SpeckingVYAS,DHRUV B., MD Primary Cardiologist:  [place] Vincent  Cody Vincent is a 73 y.o. male who presents today for routine electrophysiology followup.  Since  Having his pacemaker implanted, the patient reports doing very well. His fatigue is improved.  Today, he denies symptoms of palpitations, chest pain, shortness of breath,  lower extremity edema,  or syncope.  The patient is otherwise without complaint today.  [place] has moved to [place]  He hopes to establish residency in [place] soon.  Past Medical History  Diagnosis Date  . Near syncope   . Sinus bradycardia   . Diastolic murmur   . Hypertension   . Renal insufficiency   . History of tobacco abuse    Past Surgical History  Procedure Laterality Date  . Pacemaker insertion  03/19/13    SJM pacemaker implanted for symptomatic sinus bradycardia by Cody [place]    Current Outpatient Prescriptions  Medication Sig Dispense Refill  . ibuprofen (ADVIL,MOTRIN) 200 MG tablet Take 200 mg by mouth daily.      Marland Kitchen. lisinopril-hydrochlorothiazide (PRINZIDE,ZESTORETIC) 10-12.5 MG per tablet Take 1 tablet by mouth daily.       No current facility-administered medications for this visit.    Physical Exam: Filed Vitals:   08/22/13 0902  BP: 123/72  Pulse: 69  Height: 5\' 8"  (1.727 m)  Weight: 192 lb 12.8 oz (87.454 kg)  SpO2: 99%    GEN- The patient is well appearing, alert and oriented x 3 today.   Head- normocephalic, atraumatic Eyes-  Sclera clear, conjunctiva pink Ears- hearing intact Oropharynx- clear Lungs- Clear to ausculation bilaterally, normal work of breathing Chest- pacemaker pocket is well healed Heart- Regular rate and rhythm, no murmurs, rubs or gallops, PMI not laterally displaced GI- soft, NT, ND, + BS Extremities- no clubbing, cyanosis, or edema  Pacemaker interrogation- reviewed in detail today,  See PACEART report  Assessment and Plan:  1. 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  2. htn Stable No change required today  [place] Return [place] 10[place]15 [place]   Addendum:  Pts daughter reports that he is frequently dizzy and has had presyncopal postural symptoms.  I have encouraged oral hydration and adequate food intake. We will check bmet and CBC today.  We may need to discontinue diuretic pending results. He should follow-up with primary care if not improved.

## 2013-09-08 ENCOUNTER — Encounter (HOSPITAL_BASED_OUTPATIENT_CLINIC_OR_DEPARTMENT_OTHER): Payer: Self-pay | Admitting: Emergency Medicine

## 2013-09-08 ENCOUNTER — Emergency Department (HOSPITAL_BASED_OUTPATIENT_CLINIC_OR_DEPARTMENT_OTHER)
Admission: EM | Admit: 2013-09-08 | Discharge: 2013-09-08 | Disposition: A | Discharge status: Home / Self Care | Payer: PRIVATE HEALTH INSURANCE | Attending: Emergency Medicine | Admitting: Emergency Medicine

## 2013-09-08 ENCOUNTER — Emergency Department (HOSPITAL_BASED_OUTPATIENT_CLINIC_OR_DEPARTMENT_OTHER): Payer: PRIVATE HEALTH INSURANCE

## 2013-09-08 DIAGNOSIS — Z79899 Other long term (current) drug therapy: Secondary | ICD-10-CM | POA: Insufficient documentation

## 2013-09-08 DIAGNOSIS — Z87891 Personal history of nicotine dependence: Secondary | ICD-10-CM | POA: Insufficient documentation

## 2013-09-08 DIAGNOSIS — R51 Headache: Secondary | ICD-10-CM | POA: Insufficient documentation

## 2013-09-08 DIAGNOSIS — I10 Essential (primary) hypertension: Secondary | ICD-10-CM | POA: Insufficient documentation

## 2013-09-08 DIAGNOSIS — R519 Headache, unspecified: Secondary | ICD-10-CM

## 2013-09-08 DIAGNOSIS — R011 Cardiac murmur, unspecified: Secondary | ICD-10-CM | POA: Insufficient documentation

## 2013-09-08 DIAGNOSIS — Z87448 Personal history of other diseases of urinary system: Secondary | ICD-10-CM | POA: Insufficient documentation

## 2013-09-08 DIAGNOSIS — Z950 Presence of cardiac pacemaker: Secondary | ICD-10-CM | POA: Insufficient documentation

## 2013-09-08 LAB — CBC WITH DIFFERENTIAL/PLATELET
BASOS ABS: 0 10*3/uL (ref 0.0–0.1)
BASOS PCT: 1 % (ref 0–1)
Eosinophils Absolute: 0.2 10*3/uL (ref 0.0–0.7)
Eosinophils Relative: 3 % (ref 0–5)
HEMATOCRIT: 34.4 % — AB (ref 39.0–52.0)
Hemoglobin: 12.1 g/dL — ABNORMAL LOW (ref 13.0–17.0)
LYMPHS PCT: 25 % (ref 12–46)
Lymphs Abs: 1.5 10*3/uL (ref 0.7–4.0)
MCH: 30 pg (ref 26.0–34.0)
MCHC: 35.2 g/dL (ref 30.0–36.0)
MCV: 85.4 fL (ref 78.0–100.0)
MONO ABS: 0.5 10*3/uL (ref 0.1–1.0)
Monocytes Relative: 8 % (ref 3–12)
NEUTROS ABS: 3.7 10*3/uL (ref 1.7–7.7)
Neutrophils Relative %: 63 % (ref 43–77)
PLATELETS: 184 10*3/uL (ref 150–400)
RBC: 4.03 MIL/uL — ABNORMAL LOW (ref 4.22–5.81)
RDW: 13.7 % (ref 11.5–15.5)
WBC: 5.8 10*3/uL (ref 4.0–10.5)

## 2013-09-08 LAB — COMPREHENSIVE METABOLIC PANEL
ALBUMIN: 3.7 g/dL (ref 3.5–5.2)
ALT: 12 U/L (ref 0–53)
AST: 17 U/L (ref 0–37)
Alkaline Phosphatase: 98 U/L (ref 39–117)
BUN: 22 mg/dL (ref 6–23)
CALCIUM: 9.5 mg/dL (ref 8.4–10.5)
CO2: 26 mEq/L (ref 19–32)
CREATININE: 1.5 mg/dL — AB (ref 0.50–1.35)
Chloride: 103 mEq/L (ref 96–112)
GFR calc Af Amer: 53 mL/min — ABNORMAL LOW (ref >90)
GFR calc non Af Amer: 45 mL/min — ABNORMAL LOW (ref >90)
Glucose, Bld: 94 mg/dL (ref 70–99)
Potassium: 3.7 mEq/L (ref 3.7–5.3)
SODIUM: 140 meq/L (ref 137–147)
Total Bilirubin: 0.5 mg/dL (ref 0.3–1.2)
Total Protein: 6.9 g/dL (ref 6.0–8.3)

## 2013-09-08 MED ORDER — SODIUM CHLORIDE 0.9 % IV SOLN: INTRAVENOUS | Status: DC

## 2013-09-08 MED ORDER — HYDROCODONE-ACETAMINOPHEN 5-325 MG PO TABS: 1.0000 | ORAL_TABLET | ORAL | Status: DC | PRN

## 2013-09-08 NOTE — ED Provider Notes (Signed)
CSN: [id number]     Arrival date & time 09/08/13  1054 History   First MD Initiated Contact with Patient 09/08/13 1117     Chief Complaint  Patient presents with  . Headache     (Consider location/radiation/quality/duration/timing/severity/associated sxs/prior Treatment) Patient is a 73 y.o. male presenting with headaches. The history is provided by the patient and a relative.  Headache  patient here complaining of a four-week history of left-sided parietal occipital headache. Pain characterized as a sharp pain has been intermittent. Nothing makes this better or worse. Has seen his Dr. for this and was told that it was likely tension headaches. Denies any associated visual changes. No photophobia. No confusion. Denies any unilateral weakness. Did have a syncopal event due to the pain last week. Has used ibuprofen in the past which has helped. He denies any dyspnea or chest pain. According to the daughter, there has been no confusion. No changes in his gait. He is currently asymptomatic.  Past Medical History  Diagnosis Date  . Near syncope   . Sinus bradycardia   . Diastolic murmur   . Hypertension   . Renal insufficiency   . History of tobacco abuse    Past Surgical History  Procedure Laterality Date  . Pacemaker insertion  03/19/13    SJM pacemaker implanted for symptomatic sinus bradycardia by Dr Johney FrameAllred   No family history on file. History  Substance Use Topics  . Smoking status: Former Smoker -- 3.00 packs/day for 39 years    Types: Cigarettes    Quit date: 06/14/1996  . Smokeless tobacco: Never Used     Comment: use to smoke 3 packs per day for 6 years quit in 1993  . Alcohol Use: No    Review of Systems  Neurological: Positive for headaches.  All other systems reviewed and are negative.      Allergies  Review of patient's allergies indicates no known allergies.  Home Medications   Current Outpatient Rx  Name  Route  Sig  Dispense  Refill  . ibuprofen  (ADVIL,MOTRIN) 200 MG tablet   Oral   Take 200 mg by mouth daily.         Marland Kitchen. lisinopril-hydrochlorothiazide (PRINZIDE,ZESTORETIC) 10-12.5 MG per tablet   Oral   Take 1 tablet by mouth daily.          BP 127/64  Pulse 73  Temp(Src) 97.8 F (36.6 C) (Oral)  Resp 20  Ht 5\' 8"  (1.727 m)  Wt 175 lb (79.379 kg)  BMI 26.61 kg/m2  SpO2 97% Physical Exam  Nursing note and vitals reviewed. Constitutional: He is oriented to person, place, and time. He appears well-developed and well-nourished.  Non-toxic appearance. No distress.  HENT:  Head: Normocephalic and atraumatic.  Nontender at his left or right temporal region  Eyes: Conjunctivae, EOM and lids are normal. Pupils are equal, round, and reactive to light.  Neck: Normal range of motion. Neck supple. No tracheal deviation present. No mass present.  Cardiovascular: Normal rate, regular rhythm and normal heart sounds.  Exam reveals no gallop.   No murmur heard. Pulmonary/Chest: Effort normal and breath sounds normal. No stridor. No respiratory distress. He has no decreased breath sounds. He has no wheezes. He has no rhonchi. He has no rales.  Abdominal: Soft. Normal appearance and bowel sounds are normal. He exhibits no distension. There is no tenderness. There is no rebound and no CVA tenderness.  Musculoskeletal: Normal range of motion. He exhibits no edema and no tenderness.  Neurological: He is alert and oriented to person, place, and time. He has normal strength. He displays no tremor. No cranial nerve deficit or sensory deficit. Coordination and gait normal. GCS eye subscore is 4. GCS verbal subscore is 5. GCS motor subscore is 6.  Skin: Skin is warm and dry. No abrasion and no rash noted.  Psychiatric: He has a normal mood and affect. His speech is normal and behavior is normal.    ED Course  Procedures (including critical care time) Labs Review Labs Reviewed  CBC WITH DIFFERENTIAL - Abnormal; Notable for the following:     RBC 4.03 (*)    Hemoglobin 12.1 (*)    HCT 34.4 (*)    All other components within normal limits  COMPREHENSIVE METABOLIC PANEL - Abnormal; Notable for the following:    Creatinine, Ser 1.50 (*)    GFR calc non Af Amer 45 (*)    GFR calc Af Amer 53 (*)    All other components within normal limits   Imaging Review Ct Head Wo Contrast  09/08/2013   CLINICAL DATA:  Left posterior parietal and occipital headaches.  EXAM: CT HEAD WITHOUT CONTRAST  TECHNIQUE: Contiguous axial images were obtained from the base of the skull through the vertex without intravenous contrast.  COMPARISON:  MRI 03/06/2012 and CT 03/05/2012.  FINDINGS: There is diffuse cerebral atrophy. No hemorrhage, hydrocephalus or acute infarction. No mass lesion or midline shift.  No acute calvarial abnormality. The paranasal sinuses and mastoids are clear. Orbital soft tissues are unremarkable.  IMPRESSION: No acute intracranial abnormality.   Electronically Signed   By: Charlett Nose M.D.   On: 09/08/2013 12:07     EKG Interpretation None      MDM   Final diagnoses:  None    Patient's labs and x-rays noted . No focal neurological deficits. Patient informed of his elevation of his creatinine but when compared to prior results still has not exceeded his highest value. Suspect that he does have tension headaches. Do not think that he is temporal arteritis. He will follow with his Dr. next week    Toy Baker, MD 09/08/13 [phone]

## 2013-09-08 NOTE — ED Notes (Addendum)
Patient here with parietal/occipital headache that he reports hurts daily for the past several weeks, has been taking ibuprofen for same with relief normally but the past few days increased pain with radiation down back of neck. Reports increased blurriness with vision. No nausea, no photophobia, denies trauma. Alert and oriented. Took 400 ibuprofen pta. Also states near syncopal with the head pain last week

## 2013-09-08 NOTE — Discharge Instructions (Signed)

## 2013-10-11 ENCOUNTER — Ambulatory Visit (INDEPENDENT_AMBULATORY_CARE_PROVIDER_SITE_OTHER): Payer: PRIVATE HEALTH INSURANCE | Admitting: Physician Assistant

## 2013-10-11 ENCOUNTER — Encounter: Payer: Self-pay | Admitting: Physician Assistant

## 2013-10-11 VITALS — BP 102/54 | HR 61 | Temp 98.0°F | Resp 16 | Ht 68.0 in | Wt 197.5 lb

## 2013-10-11 DIAGNOSIS — I10 Essential (primary) hypertension: Secondary | ICD-10-CM

## 2013-10-11 DIAGNOSIS — D649 Anemia, unspecified: Secondary | ICD-10-CM

## 2013-10-11 DIAGNOSIS — R519 Headache, unspecified: Secondary | ICD-10-CM

## 2013-10-11 DIAGNOSIS — R51 Headache: Secondary | ICD-10-CM

## 2013-10-11 DIAGNOSIS — I359 Nonrheumatic aortic valve disorder, unspecified: Secondary | ICD-10-CM

## 2013-10-11 DIAGNOSIS — I351 Nonrheumatic aortic (valve) insufficiency: Secondary | ICD-10-CM

## 2013-10-11 LAB — BASIC METABOLIC PANEL
BUN: 22 mg/dL (ref 6–23)
CALCIUM: 9.1 mg/dL (ref 8.4–10.5)
CO2: 26 meq/L (ref 19–32)
CREATININE: 1.16 mg/dL (ref 0.50–1.35)
Chloride: 106 mEq/L (ref 96–112)
GLUCOSE: 115 mg/dL — AB (ref 70–99)
Potassium: 4.1 mEq/L (ref 3.5–5.3)
SODIUM: 138 meq/L (ref 135–145)

## 2013-10-11 LAB — CBC WITH DIFFERENTIAL/PLATELET
Basophils Absolute: 0.1 10*3/uL (ref 0.0–0.1)
Basophils Relative: 1 % (ref 0–1)
EOS ABS: 0.1 10*3/uL (ref 0.0–0.7)
EOS PCT: 2 % (ref 0–5)
HEMATOCRIT: 34.8 % — AB (ref 39.0–52.0)
HEMOGLOBIN: 12.1 g/dL — AB (ref 13.0–17.0)
LYMPHS ABS: 1.3 10*3/uL (ref 0.7–4.0)
Lymphocytes Relative: 19 % (ref 12–46)
MCH: 30 pg (ref 26.0–34.0)
MCHC: 34.8 g/dL (ref 30.0–36.0)
MCV: 86.4 fL (ref 78.0–100.0)
MONO ABS: 0.5 10*3/uL (ref 0.1–1.0)
Monocytes Relative: 7 % (ref 3–12)
Neutro Abs: 4.8 10*3/uL (ref 1.7–7.7)
Neutrophils Relative %: 71 % (ref 43–77)
Platelets: 198 10*3/uL (ref 150–400)
RBC: 4.03 MIL/uL — AB (ref 4.22–5.81)
RDW: 14 % (ref 11.5–15.5)
WBC: 6.7 10*3/uL (ref 4.0–10.5)

## 2013-10-11 LAB — TSH: TSH: 2.101 u[IU]/mL (ref 0.350–4.500)

## 2013-10-11 NOTE — Progress Notes (Signed)
Pre visit review using our clinic review tool, if applicable. No additional management support is needed unless otherwise documented below in the visit note/SLS  

## 2013-10-11 NOTE — Patient Instructions (Addendum)
Please obtain labs. I will call you with your results.  Please increase your intake of water.  Try to decrease caffeine.  You are not getting enough fluids.  Continue medications as directed.  Will select medications based on lab results.  Apply topical Icy Hot or Aspercreme to neck.  Apply heating pad for 15 minutes. For now, take an aleve for headache.  Follow-up in 2 weeks.  Follow-up with Cardiology as scheduled.

## 2013-10-11 NOTE — Progress Notes (Signed)
Patient presents to clinic today to establish care. Patient has acute concerns and will be returning for [facility] Wellness Exam with labs.  Acute Concerns: Patient c/o episodic lightheadedness and headaches that has been present intermittently over the past 2 months.  Patient had a syncopal episode ~ 2 months ago and was taken to the ER.  Workup including CT head was unremarkable for acute pathology. Thought to be tension headaches.  Patient was given narcotic pain medications for headache with no improvement in symptoms. Patient has also addressed concerns with his Cardiologist who felt patient was not getting  enough fluid intake. OVS are negative although BP is at low end of normotensive range. Patient on a small dose of lisinopril-HCTZ, taking 1/2 tablet per day.  Patient denies N/V with headache.  Denies photophobia, phonophobia or vision changes.  Endorses tightness in neck.  Chronic Issues: Hypertension -- followed by Cardiology due to Aortic Regurgitation.  BP well controlled with Prinzide.  Denies chest pain, palpitations.  Endorses positional lightheadedness (see above).  BP 98/52 in clinic today.  Orthostatics are negative.  Past Medical History  Diagnosis Date  . Near syncope   . Sinus bradycardia   . Diastolic murmur   . Hypertension   . Renal insufficiency   . History of tobacco abuse   . Syncope     Past Surgical History  Procedure Laterality Date  . Pacemaker insertion  03/19/13    SJM pacemaker implanted for symptomatic sinus bradycardia by Dr Rayann Heman  . Kidney stone surgery  06/2013  . Multiple tooth extractions      Full Dentures    Current Outpatient Prescriptions on File Prior to Visit  Medication Sig Dispense Refill  . ibuprofen (ADVIL,MOTRIN) 200 MG tablet Take 200 mg by mouth daily.      Marland Kitchen lisinopril-hydrochlorothiazide (PRINZIDE,ZESTORETIC) 10-12.5 MG per tablet Take 1 tablet by mouth daily.       No current facility-administered medications on file prior to  visit.    No Known Allergies  Family History  Problem Relation Age of Onset  . Heart disease Father     Deceased-90s  . Cancer Father   . Stroke Mother 60    Deceased  . Heart disease Mother   . Dementia Mother   . Healthy Brother     x1  . Hypertension Daughter     x1    History   Social History  . Marital Status: Single    Spouse Name: N/A    Number of Children: N/A  . Years of Education: N/A   Occupational History  . Not on file.   Social History Main Topics  . Smoking status: Former Smoker -- 3.00 packs/day for 39 years    Types: Cigarettes    Quit date: 06/14/1996  . Smokeless tobacco: Never Used     Comment: use to smoke 3 packs per day for 6 years quit in 1993  . Alcohol Use: No  . Drug Use: No  . Sexual Activity: No   Other Topics Concern  . Not on file   Social History Narrative  . No narrative on file   ROS See HPI.  All other ROS are negative.  BP 102/54  Pulse 61  Temp(Src) 98 F (36.7 C) (Oral)  Resp 16  Ht 5' 8"  (1.727 m)  Wt 197 lb 8 oz (89.585 kg)  BMI 30.04 kg/m2  SpO2 99%  Physical Exam  Vitals reviewed. Constitutional: He is oriented to person, place, and time and  well-developed, well-nourished, and in no distress.  HENT:  Head: Normocephalic and atraumatic.  Right Ear: External ear normal.  Left Ear: External ear normal.  Nose: Nose normal.  Mouth/Throat: Oropharynx is clear and moist. No oropharyngeal exudate.  TM within normal limits.  Eyes: Conjunctivae and EOM are normal. Pupils are equal, round, and reactive to light.  Neck: Trachea normal and normal range of motion. Neck supple. Muscular tenderness present. No spinous process tenderness present. Carotid bruit is not present. No rigidity. Normal range of motion present.  Cardiovascular: Normal rate, regular rhythm, normal heart sounds and intact distal pulses.   Pulmonary/Chest: Effort normal and breath sounds normal. No respiratory distress. He has no wheezes. He has  no rales. He exhibits no tenderness.  Neurological: He is alert and oriented to person, place, and time. No cranial nerve deficit. Gait normal. GCS score is 15.  Skin: Skin is warm and dry. No rash noted.  Psychiatric: Affect normal.   Recent Results (from the past 2160 hour(s))  [id number]_TYPE_INCLINIC     Status: None   Collection Time    08/22/13  1:11 PM      Result Value Ref Range   Date Time Interrogation Session [phone]     Pulse Generator Manufacturer [employer]     Pulse Gen Model [address] DR     Pulse Gen Serial Number [id number]     RV Sense Sensitivity 2.0     RA Pace Amplitude 2.0     RV Pace PulseWidth 0.4     RV Pace Amplitude 3.5     RA Impedance 512.5     RA Amplitude 1.9     RA Pacing Amplitude 0.75     RA Pacing PulseWidth 0.4     RV IMPEDANCE 712.5     RV Amplitude 9.3     RV Pacing Amplitude 0.75     RV Pacing PulseWidth 0.4     Battery Status Unknown     Battery Longevity 123.6     Battery Voltage 2.99     Brady RA Perc Paced 95.0     Brady RV Perc Paced 0.17     Eval Rhythm SB     Miscellaneous Comment       Value: Pacemaker check in clinic. Normal device function. Thresholds, sensing, impedances consistent with previous measurements. Device programmed to maximize longevity. 2 mode switches--longest was 32 seconds. No high ventricular rates noted. 4 PMT      episodes--VA conduction 297 with PVARP set at 300 ms. Device programmed at appropriate safety margins. Histogram distribution appropriate for patient activity level. Device programmed to optimize intrinsic conduction. Estimated longevity 7.9 to 10.3      years. Patient enrolled in remote follow-up. Merlin 11-26-13 and ROV in October with JA.  CBC WITH DIFFERENTIAL     Status: Abnormal   Collection Time    09/08/13 11:40 AM      Result Value Ref Range   WBC 5.8  4.0 - 10.5 K/uL   RBC 4.03 (*) 4.22 - 5.81 MIL/uL   Hemoglobin 12.1 (*) 13.0 - 17.0 g/dL   HCT 34.4 (*) 39.0 - 52.0  %   MCV 85.4  78.0 - 100.0 fL   MCH 30.0  26.0 - 34.0 pg   MCHC 35.2  30.0 - 36.0 g/dL   RDW 13.7  11.5 - 15.5 %   Platelets 184  150 - 400 K/uL   Neutrophils Relative % 63  43 - 77 %  Neutro Abs 3.7  1.7 - 7.7 K/uL   Lymphocytes Relative 25  12 - 46 %   Lymphs Abs 1.5  0.7 - 4.0 K/uL   Monocytes Relative 8  3 - 12 %   Monocytes Absolute 0.5  0.1 - 1.0 K/uL   Eosinophils Relative 3  0 - 5 %   Eosinophils Absolute 0.2  0.0 - 0.7 K/uL   Basophils Relative 1  0 - 1 %   Basophils Absolute 0.0  0.0 - 0.1 K/uL  COMPREHENSIVE METABOLIC PANEL     Status: Abnormal   Collection Time    09/08/13 11:40 AM      Result Value Ref Range   Sodium 140  137 - 147 mEq/L   Potassium 3.7  3.7 - 5.3 mEq/L   Chloride 103  96 - 112 mEq/L   CO2 26  19 - 32 mEq/L   Glucose, Bld 94  70 - 99 mg/dL   BUN 22  6 - 23 mg/dL   Creatinine, Ser 1.50 (*) 0.50 - 1.35 mg/dL   Calcium 9.5  8.4 - 10.5 mg/dL   Total Protein 6.9  6.0 - 8.3 g/dL   Albumin 3.7  3.5 - 5.2 g/dL   AST 17  0 - 37 U/L   ALT 12  0 - 53 U/L   Alkaline Phosphatase 98  39 - 117 U/L   Total Bilirubin 0.5  0.3 - 1.2 mg/dL   GFR calc non Af Amer 45 (*) >90 mL/min   GFR calc Af Amer 53 (*) >90 mL/min   Comment: (NOTE)     The eGFR has been calculated using the CKD EPI equation.     This calculation has not been validated in all clinical situations.     eGFR's persistently <90 mL/min signify possible Chronic Kidney     Disease.  CBC WITH DIFFERENTIAL     Status: Abnormal   Collection Time    10/11/13  3:46 PM      Result Value Ref Range   WBC 6.7  4.0 - 10.5 K/uL   RBC 4.03 (*) 4.22 - 5.81 MIL/uL   Hemoglobin 12.1 (*) 13.0 - 17.0 g/dL   HCT 34.8 (*) 39.0 - 52.0 %   MCV 86.4  78.0 - 100.0 fL   MCH 30.0  26.0 - 34.0 pg   MCHC 34.8  30.0 - 36.0 g/dL   RDW 14.0  11.5 - 15.5 %   Platelets 198  150 - 400 K/uL   Neutrophils Relative % 71  43 - 77 %   Neutro Abs 4.8  1.7 - 7.7 K/uL   Lymphocytes Relative 19  12 - 46 %   Lymphs Abs 1.3  0.7 -  4.0 K/uL   Monocytes Relative 7  3 - 12 %   Monocytes Absolute 0.5  0.1 - 1.0 K/uL   Eosinophils Relative 2  0 - 5 %   Eosinophils Absolute 0.1  0.0 - 0.7 K/uL   Basophils Relative 1  0 - 1 %   Basophils Absolute 0.1  0.0 - 0.1 K/uL   Smear Review Criteria for review not met    BASIC METABOLIC PANEL     Status: Abnormal   Collection Time    10/11/13  3:46 PM      Result Value Ref Range   Sodium 138  135 - 145 mEq/L   Potassium 4.1  3.5 - 5.3 mEq/L   Chloride 106  96 - 112 mEq/L  CO2 26  19 - 32 mEq/L   Glucose, Bld 115 (*) 70 - 99 mg/dL   BUN 22  6 - 23 mg/dL   Creat 1.16  0.50 - 1.35 mg/dL   Calcium 9.1  8.4 - 10.5 mg/dL  TSH     Status: None   Collection Time    10/11/13  3:46 PM      Result Value Ref Range   TSH 2.101  0.350 - 4.500 uIU/mL    Assessment/Plan: Aortic regurgitation Followed by Cardiology.  Has upcoming appointment.  Essential hypertension OVS negative.  Increase fluid intake. Continue current regimen.  Monitor BP at home.  Records results to bring to follow-up visit.  If persistently lower BP, may need to tweak medications.  Follow-up with Cardiology as scheduled.  Follow-up in 2 weeks.  Persistent headaches Seems multifactorial -- tension and low fluid volume 2/2 poor fluid intake.  Wish to avoid muscle relaxants in elderly patient.  Topical NSAIDs, heating pads, ROM exercises.  Patient challenged to increase fluid intake.  Aleve or Tylenol for headache. Will obtain labs.  Follow-up in 2 weeks.

## 2013-10-16 DIAGNOSIS — R519 Headache, unspecified: Secondary | ICD-10-CM | POA: Insufficient documentation

## 2013-10-16 DIAGNOSIS — R51 Headache: Secondary | ICD-10-CM

## 2013-10-16 NOTE — Assessment & Plan Note (Signed)
Seems multifactorial -- tension and low fluid volume 2/2 poor fluid intake.  Wish to avoid muscle relaxants in elderly patient.  Topical NSAIDs, heating pads, ROM exercises.  Patient challenged to increase fluid intake.  Aleve or Tylenol for headache. Will obtain labs.  Follow-up in 2 weeks.

## 2013-10-16 NOTE — Assessment & Plan Note (Signed)
OVS negative.  Increase fluid intake. Continue current regimen.  Monitor BP at home.  Records results to bring to follow-up visit.  If persistently lower BP, may need to tweak medications.  Follow-up with Cardiology as scheduled.  Follow-up in 2 weeks.

## 2013-10-16 NOTE — Assessment & Plan Note (Signed)
Followed by Cardiology.  Has upcoming appointment.

## 2013-10-25 ENCOUNTER — Ambulatory Visit (INDEPENDENT_AMBULATORY_CARE_PROVIDER_SITE_OTHER): Payer: PRIVATE HEALTH INSURANCE | Admitting: Physician Assistant

## 2013-10-25 ENCOUNTER — Encounter: Payer: Self-pay | Admitting: Physician Assistant

## 2013-10-25 VITALS — BP 96/52 | HR 61 | Temp 98.1°F | Resp 16 | Ht 68.0 in | Wt 200.2 lb

## 2013-10-25 DIAGNOSIS — I359 Nonrheumatic aortic valve disorder, unspecified: Secondary | ICD-10-CM

## 2013-10-25 DIAGNOSIS — I739 Peripheral vascular disease, unspecified: Secondary | ICD-10-CM

## 2013-10-25 DIAGNOSIS — R51 Headache: Secondary | ICD-10-CM

## 2013-10-25 DIAGNOSIS — I10 Essential (primary) hypertension: Secondary | ICD-10-CM

## 2013-10-25 DIAGNOSIS — E785 Hyperlipidemia, unspecified: Secondary | ICD-10-CM

## 2013-10-25 DIAGNOSIS — Z0000 Encounter for general adult medical examination without abnormal findings: Secondary | ICD-10-CM

## 2013-10-25 DIAGNOSIS — I351 Nonrheumatic aortic (valve) insufficiency: Secondary | ICD-10-CM

## 2013-10-25 DIAGNOSIS — R519 Headache, unspecified: Secondary | ICD-10-CM

## 2013-10-25 NOTE — Progress Notes (Signed)
Pre visit review using our clinic review tool, if applicable. No additional management support is needed unless otherwise documented below in the visit note/SLS  

## 2013-10-25 NOTE — Patient Instructions (Signed)
Please take 1/2 tablet of your BP medications.  Continue good fluid intake.  Return to labs fasting for lipid panel.  The address to the Saturday lab is [address]  They are open from 8 AM - Noon. I will call you with your results.  We will start therapy based on results.  You will be contacted for imaging to assess for Peripheral Vascular Disease.

## 2013-10-25 NOTE — Progress Notes (Signed)
Subjective:   Patient here for Medicare annual wellness visit and management of other chronic and acute problems.  Hypertension -- followed by Cardiology due to Aortic Regurgitation. BP well controlled with Prinzide. Denies chest pain, palpitations. BP 98/52 in clinic today. Orthostatics are negative. Patient due for fasting lipid panel.    Headaches -- endorses have much improved since starting to increase fluid intake and eating more frequent meals.  Has not had a headache in over 1.5 weeks.  Patient complains of pain in his right thigh that only occurs when has walked "a good bit".  Pain is relieved quickly with rest.  Patient with history of HTN and previously history of hyperlipidemia.  Patient denies trauma, injury.  Denies swelling, numbness or tenderness of lower extremity. Denies joint pain or tenderness.  Denies unsteady gait or fall.  Risk factors: Age, Gender, Hypertension  Roster of Physicians Providing Medical Care to Patient: Marcelline MatesWilliam Dane Bloch, PA-C -- Primary Care Dr. Johney FrameAllred -- Cardiology  Activities of Daily Living  In your present state of health, do you have any difficulty performing the following activities? Preparing food and eating?: No  Bathing yourself: No  Getting dressed: No  Using the toilet:No  Moving around from place to place: No  In the past year have you fallen or had a near fall?:No    Home Safety: Has smoke detector and wears seat belts. No firearms. No excess sun exposure.  Diet and Exercise  Current exercise habits: Walking daily.  Dietary issues discussed: Healthy diet   Depression Screen  (Note: if answer to either of the following is "Yes", then a more complete depression screening is indicated)  Q1: Over the past two weeks, have you felt down, depressed or hopeless? no  Q2: Over the past two weeks, have you felt little interest or pleasure in doing things? no   The following portions of the patient's history were reviewed and updated as  appropriate: allergies, current medications, past family history, past medical history, past social history, past surgical history and problem list.   Review of Systems  Constitutional: Negative for fever and weight loss.  HENT: Negative for ear discharge, ear pain, hearing loss and tinnitus.   Eyes: Negative for blurred vision, double vision, photophobia and pain.  Respiratory: Negative for cough and shortness of breath.   Cardiovascular: Negative for chest pain and palpitations.  Gastrointestinal: Negative for heartburn, nausea, vomiting, abdominal pain, diarrhea, constipation, blood in stool and melena.  Genitourinary: Negative for dysuria, urgency, frequency, hematuria and flank pain.  Musculoskeletal: Negative for falls.  Skin: Negative for rash.  Neurological: Negative for dizziness, loss of consciousness and headaches.  Endo/Heme/Allergies: Negative for environmental allergies.  Psychiatric/Behavioral: Negative for depression, suicidal ideas, hallucinations and substance abuse. The patient is not nervous/anxious.     Objective:   Vision: R 20/30, L 20/40, B 20/20 Hearing: Patient can hear a forced whisper from across the room. Body mass index: Body mass index is 30.46 kg/(m^2). Cognitive Impairment Assessment: cognition, memory and judgment appear normal.   Physical Exam  Vitals reviewed. Constitutional: He is oriented to person, place, and time and well-developed, well-nourished, and in no distress.  HENT:  Head: Normocephalic and atraumatic.  Right Ear: External ear normal.  Left Ear: External ear normal.  Nose: Nose normal.  Mouth/Throat: Oropharynx is clear and moist. No oropharyngeal exudate.  TM within normal limits bilaterally.  Eyes: Conjunctivae and EOM are normal. Pupils are equal, round, and reactive to light.  Neck: Neck supple. No thyromegaly  present.  Cardiovascular: Normal rate, regular rhythm and intact distal pulses.   Murmur of aortic regurgitation  auscultated on exam.  II/VI.  Unchanged from previous examination.  Pulmonary/Chest: Effort normal and breath sounds normal. No respiratory distress. He has no wheezes. He has no rales. He exhibits no tenderness.  Abdominal: Soft. Bowel sounds are normal. He exhibits no distension and no mass. There is no tenderness. There is no rebound and no guarding.  Musculoskeletal:       Right hip: Normal.       Right knee: Normal.       Right ankle: Normal.       Right upper leg: He exhibits no tenderness, no bony tenderness, no swelling, no edema, no deformity and no laceration.       Right lower leg: He exhibits no tenderness, no bony tenderness, no swelling, no edema, no deformity and no laceration.  Lymphadenopathy:    He has no cervical adenopathy.  Neurological: He is alert and oriented to person, place, and time. He has normal reflexes. No cranial nerve deficit. Gait normal.  Skin: Skin is warm and dry. No rash noted.  Psychiatric: Mood, memory, affect and judgment normal.   Assessment:   (1)Medicare wellness utd on preventive parameters (2)Hypertension (3)Hyperlipidemia (4)Headaches (5)Intermittent Claudication (6)Aortic Regurgitation  Plan:   (1)During the course of the visit the patient was educated and counseled about appropriate screening and preventive services including:  Fall prevention, Diabetes screening, Nutrition counseling.  Will obtain fasting labs.  Patient up-to-date on Colonoscopy.  Declines Pneumovax and TDaP. We are in the process of obtaining records from previous PCP  (2)Well controlled.  Continue current regimen. Follow-up with Cardiology.  (3) Repeat fasting lipid panel.  (4) Resolved with dietary changes  (5) Will obtain lipid panel.  Will order US and formal ABI. Follow-up with Cardiology.  (6) Stable.  Follow-up with Cardiology.  Patient Instructions (the written plan) was given to the patient.

## 2013-10-26 ENCOUNTER — Telehealth: Payer: Self-pay | Admitting: Physician Assistant

## 2013-10-26 DIAGNOSIS — I739 Peripheral vascular disease, unspecified: Secondary | ICD-10-CM

## 2013-10-26 NOTE — Telephone Encounter (Signed)
Order placed for arterial doppler for ABI

## 2013-10-27 ENCOUNTER — Telehealth: Payer: Self-pay | Admitting: Family

## 2013-10-27 DIAGNOSIS — I739 Peripheral vascular disease, unspecified: Secondary | ICD-10-CM

## 2013-10-27 LAB — LIPID PANEL
Cholesterol: 176 mg/dL (ref 0–200)
HDL: 38 mg/dL — AB (ref >39)
LDL CALC: 117 mg/dL — AB (ref 0–99)
Total CHOL/HDL Ratio: 4.6 Ratio
Triglycerides: 107 mg/dL (ref <150)
VLDL: 21 mg/dL (ref 0–40)

## 2013-10-28 NOTE — Telephone Encounter (Signed)
Opened in error

## 2013-10-31 DIAGNOSIS — Z0000 Encounter for general adult medical examination without abnormal findings: Secondary | ICD-10-CM | POA: Insufficient documentation

## 2013-10-31 DIAGNOSIS — E785 Hyperlipidemia, unspecified: Secondary | ICD-10-CM | POA: Insufficient documentation

## 2013-10-31 DIAGNOSIS — I739 Peripheral vascular disease, unspecified: Secondary | ICD-10-CM | POA: Insufficient documentation

## 2013-10-31 NOTE — Assessment & Plan Note (Signed)
Stable  Follow up with Cardiology

## 2013-10-31 NOTE — Assessment & Plan Note (Signed)
During the course of the visit the patient was educated and counseled about appropriate screening and preventive services including:  Fall prevention, Diabetes screening, Nutrition counseling.  Will obtain fasting labs.  Patient up-to-date on Colonoscopy.  Declines Pneumovax and TDaP. We are in the process of obtaining records from previous PCP  

## 2013-10-31 NOTE — Assessment & Plan Note (Signed)
During the course of the visit the patient was educated and counseled about appropriate screening and preventive services including:  Fall prevention, Diabetes screening, Nutrition counseling.  Will obtain fasting labs.  Patient up-to-date on Colonoscopy.  Declines Pneumovax and TDaP. We are in the process of obtaining records from previous PCP

## 2013-10-31 NOTE — Assessment & Plan Note (Signed)
Resolved with dietary changes.  

## 2013-10-31 NOTE — Assessment & Plan Note (Signed)
Will obtain lipid panel.  Will order US and formal ABI. Follow-up with Cardiology.

## 2013-10-31 NOTE — Assessment & Plan Note (Signed)
Well controlled.  Continue current regimen. Follow-up with Cardiology.

## 2013-11-07 ENCOUNTER — Encounter (HOSPITAL_COMMUNITY): Payer: PRIVATE HEALTH INSURANCE

## 2013-11-09 ENCOUNTER — Ambulatory Visit: Payer: PRIVATE HEALTH INSURANCE | Admitting: Physician Assistant

## 2013-11-09 ENCOUNTER — Ambulatory Visit (INDEPENDENT_AMBULATORY_CARE_PROVIDER_SITE_OTHER): Payer: PRIVATE HEALTH INSURANCE | Admitting: Physician Assistant

## 2013-11-09 ENCOUNTER — Encounter: Payer: Self-pay | Admitting: Physician Assistant

## 2013-11-09 VITALS — BP 88/54 | HR 63 | Temp 98.4°F | Resp 16 | Ht 68.0 in | Wt 196.0 lb

## 2013-11-09 DIAGNOSIS — IMO0001 Reserved for inherently not codable concepts without codable children: Secondary | ICD-10-CM

## 2013-11-09 DIAGNOSIS — Z719 Counseling, unspecified: Secondary | ICD-10-CM

## 2013-11-09 DIAGNOSIS — Z201 Contact with and (suspected) exposure to tuberculosis: Secondary | ICD-10-CM

## 2013-11-09 DIAGNOSIS — Z111 Encounter for screening for respiratory tuberculosis: Secondary | ICD-10-CM

## 2013-11-09 NOTE — Progress Notes (Signed)
Pre visit review using our clinic review tool, if applicable. No additional management support is needed unless otherwise documented below in the visit note/SLS  

## 2013-11-11 DIAGNOSIS — Z719 Counseling, unspecified: Secondary | ICD-10-CM | POA: Insufficient documentation

## 2013-11-11 NOTE — Assessment & Plan Note (Signed)
Exam performed with acute findings.  Forms completed. PPD test placed.  Patient to have read on Sunday at nursing facility.  Will fax results to our office.  Return in 2-3 weeks for second PPD placement.

## 2013-11-11 NOTE — Progress Notes (Signed)
Patient presents to clinic today with his daughter for form completion.  Patient is going to be staying at an assisted living facility.  Patient also needing 2-step PPD placement.  Past Medical History  Diagnosis Date  . Near syncope   . Sinus bradycardia   . Diastolic murmur   . Hypertension   . Renal insufficiency   . History of tobacco abuse   . Syncope     Current Outpatient Prescriptions on File Prior to Visit  Medication Sig Dispense Refill  . aspirin 81 MG chewable tablet Chew 81 mg by mouth daily.      Marland Kitchen lisinopril-hydrochlorothiazide (PRINZIDE,ZESTORETIC) 10-12.5 MG per tablet Take 0.5 tablets by mouth daily.       . naproxen sodium (ANAPROX) 220 MG tablet Take 220 mg by mouth daily.       No current facility-administered medications on file prior to visit.    No Known Allergies  Family History  Problem Relation Age of Onset  . Heart disease Father     Deceased-90s  . Cancer Father   . Stroke Mother 74    Deceased  . Heart disease Mother   . Dementia Mother   . Healthy Brother     x1  . Hypertension Daughter     x1    History   Social History  . Marital Status: Single    Spouse Name: N/A    Number of Children: N/A  . Years of Education: N/A   Social History Main Topics  . Smoking status: Former Smoker -- 3.00 packs/day for 39 years    Types: Cigarettes    Quit date: 06/14/1996  . Smokeless tobacco: Never Used     Comment: use to smoke 3 packs per day for 6 years quit in 1993  . Alcohol Use: No  . Drug Use: No  . Sexual Activity: No   Other Topics Concern  . None   Social History Narrative  . None   Review of Systems - See HPI.  All other ROS are negative.  BP 88/54  Pulse 63  Temp(Src) 98.4 F (36.9 C) (Oral)  Resp 16  Ht _0  (1.727 m)  Wt 196 lb (88.905 kg)  BMI 29.81 kg/m2  SpO2 98%  Physical Exam  Vitals reviewed. Constitutional: He is oriented to person, place, and time and well-developed, well-nourished, and in no distress.   HENT:  Head: Normocephalic and atraumatic.  Eyes: Conjunctivae are normal. Pupils are equal, round, and reactive to light.  Neck: Neck supple.  Cardiovascular: Normal rate, regular rhythm, normal heart sounds and intact distal pulses.   Pulmonary/Chest: Effort normal and breath sounds normal. No respiratory distress. He has no wheezes. He has no rales. He exhibits no tenderness.  Neurological: He is alert and oriented to person, place, and time.  Skin: Skin is warm and dry. No rash noted.  Psychiatric: Mood, memory, affect and judgment normal.    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     Pulse Gen Model 2240 Assurity DR     Pulse Gen Serial Number [id number]     RV Sense Sensitivity 2.0     RA Pace Amplitude 2.0     RV Pace PulseWidth 0.4  RV Pace Amplitude 3.5     RA Impedance 512.5     RA Amplitude 1.9     RA Pacing Amplitude 0.75     RA Pacing PulseWidth 0.4     RV IMPEDANCE 712.5     RV Amplitude 9.3     RV Pacing Amplitude 0.75     RV Pacing PulseWidth 0.4     Battery Status Unknown     Battery Longevity 123.6     Battery Voltage 2.99     Brady RA Perc Paced 95.0     Brady RV Perc Paced 0.17     Eval Rhythm SB     Miscellaneous Comment       Value: Pacemaker check in clinic. Normal device function. Thresholds, sensing, impedances consistent with previous measurements. Device programmed to maximize longevity. 2 mode switches--longest was 32 seconds. No high ventricular rates noted. 4 PMT      episodes--VA conduction 297 with PVARP set at 300 ms. Device programmed at appropriate safety margins. Histogram distribution appropriate for patient activity level. Device programmed to optimize intrinsic conduction. Estimated longevity 7.9 to 10.3      years.  Patient enrolled in remote follow-up. Merlin 11-26-13 and ROV in October with JA.  CBC WITH DIFFERENTIAL     Status: Abnormal   Collection Time    09/08/13 11:40 AM      Result Value Ref Range   WBC 5.8  4.0 - 10.5 K/uL   RBC 4.03 (*) 4.22 - 5.81 MIL/uL   Hemoglobin 12.1 (*) 13.0 - 17.0 g/dL   HCT 34.4 (*) 39.0 - 52.0 %   MCV 85.4  78.0 - 100.0 fL   MCH 30.0  26.0 - 34.0 pg   MCHC 35.2  30.0 - 36.0 g/dL   RDW 13.7  11.5 - 15.5 %   Platelets 184  150 - 400 K/uL   Neutrophils Relative % 63  43 - 77 %   Neutro Abs 3.7  1.7 - 7.7 K/uL   Lymphocytes Relative 25  12 - 46 %   Lymphs Abs 1.5  0.7 - 4.0 K/uL   Monocytes Relative 8  3 - 12 %   Monocytes Absolute 0.5  0.1 - 1.0 K/uL   Eosinophils Relative 3  0 - 5 %   Eosinophils Absolute 0.2  0.0 - 0.7 K/uL   Basophils Relative 1  0 - 1 %   Basophils Absolute 0.0  0.0 - 0.1 K/uL  COMPREHENSIVE METABOLIC PANEL     Status: Abnormal   Collection Time    09/08/13 11:40 AM      Result Value Ref Range   Sodium 140  137 - 147 mEq/L   Potassium 3.7  3.7 - 5.3 mEq/L   Chloride 103  96 - 112 mEq/L   CO2 26  19 - 32 mEq/L   Glucose, Bld 94  70 - 99 mg/dL   BUN 22  6 - 23 mg/dL   Creatinine, Ser 1.50 (*) 0.50 - 1.35 mg/dL   Calcium 9.5  8.4 - 10.5 mg/dL   Total Protein 6.9  6.0 - 8.3 g/dL   Albumin 3.7  3.5 - 5.2 g/dL   AST 17  0 - 37 U/L   ALT 12  0 - 53 U/L   Alkaline Phosphatase 98  39 - 117 U/L   Total Bilirubin 0.5  0.3 - 1.2 mg/dL   GFR calc non Af Amer 45 (*) >90 mL/min   GFR calc Af  Amer 53 (*) >90 mL/min   Comment: (NOTE)     The eGFR has been calculated using the CKD EPI equation.     This calculation has not been validated in all clinical situations.     eGFR's persistently <90 mL/min signify possible Chronic Kidney     Disease.  CBC WITH DIFFERENTIAL     Status: Abnormal   Collection Time    10/11/13  3:46 PM      Result Value Ref Range   WBC 6.7  4.0 - 10.5 K/uL   RBC 4.03 (*) 4.22 - 5.81 MIL/uL   Hemoglobin 12.1 (*) 13.0  - 17.0 g/dL   HCT 34.8 (*) 39.0 - 52.0 %   MCV 86.4  78.0 - 100.0 fL   MCH 30.0  26.0 - 34.0 pg   MCHC 34.8  30.0 - 36.0 g/dL   RDW 14.0  11.5 - 15.5 %   Platelets 198  150 - 400 K/uL   Neutrophils Relative % 71  43 - 77 %   Neutro Abs 4.8  1.7 - 7.7 K/uL   Lymphocytes Relative 19  12 - 46 %   Lymphs Abs 1.3  0.7 - 4.0 K/uL   Monocytes Relative 7  3 - 12 %   Monocytes Absolute 0.5  0.1 - 1.0 K/uL   Eosinophils Relative 2  0 - 5 %   Eosinophils Absolute 0.1  0.0 - 0.7 K/uL   Basophils Relative 1  0 - 1 %   Basophils Absolute 0.1  0.0 - 0.1 K/uL   Smear Review Criteria for review not met    BASIC METABOLIC PANEL     Status: Abnormal   Collection Time    10/11/13  3:46 PM      Result Value Ref Range   Sodium 138  135 - 145 mEq/L   Potassium 4.1  3.5 - 5.3 mEq/L   Chloride 106  96 - 112 mEq/L   CO2 26  19 - 32 mEq/L   Glucose, Bld 115 (*) 70 - 99 mg/dL   BUN 22  6 - 23 mg/dL   Creat 1.16  0.50 - 1.35 mg/dL   Calcium 9.1  8.4 - 10.5 mg/dL  TSH     Status: None   Collection Time    10/11/13  3:46 PM      Result Value Ref Range   TSH 2.101  0.350 - 4.500 uIU/mL  LIPID PANEL     Status: Abnormal   Collection Time    10/25/13  9:11 AM      Result Value Ref Range   Cholesterol 176  0 - 200 mg/dL   Comment: ATP III Classification:           < 200        mg/dL        Desirable          200 - 239     mg/dL        Borderline High          >= 240        mg/dL        High         Triglycerides 107  <150 mg/dL   HDL 38 (*) >39 mg/dL   Total CHOL/HDL Ratio 4.6     VLDL 21  0 - 40 mg/dL   LDL Cholesterol 117 (*) 0 - 99 mg/dL   Comment:       Total  Cholesterol/HDL Ratio:CHD Risk                            Coronary Heart Disease Risk Table                                            Men       Women              1/2 Average Risk              3.4        3.3                  Average Risk              5.0        4.4               2X Average Risk              9.6        7.1                3X Average Risk             23.4       11.0     Use the calculated Patient Ratio above and the CHD Risk table      to determine the patient's CHD Risk.     ATP III Classification (LDL):           < 100        mg/dL         Optimal          100 - 129     mg/dL         Near or Above Optimal          130 - 159     mg/dL         Borderline High          160 - 189     mg/dL         High           > 190        mg/dL         Very High          Assessment/Plan: Consultation without specific complaint Exam performed with acute findings.  Forms completed. PPD test placed.  Patient to have read on Sunday at nursing facility.  Will fax results to our office.  Return in 2-3 weeks for second PPD placement.

## 2013-11-13 ENCOUNTER — Encounter (HOSPITAL_COMMUNITY): Payer: PRIVATE HEALTH INSURANCE

## 2013-11-14 ENCOUNTER — Ambulatory Visit: Payer: PRIVATE HEALTH INSURANCE | Admitting: Physician Assistant

## 2013-11-14 ENCOUNTER — Ambulatory Visit: Payer: PRIVATE HEALTH INSURANCE

## 2013-11-16 ENCOUNTER — Ambulatory Visit (HOSPITAL_COMMUNITY): Payer: PRIVATE HEALTH INSURANCE | Attending: Physician Assistant | Admitting: Cardiology

## 2013-11-16 DIAGNOSIS — I739 Peripheral vascular disease, unspecified: Secondary | ICD-10-CM

## 2013-11-16 DIAGNOSIS — Z87891 Personal history of nicotine dependence: Secondary | ICD-10-CM | POA: Insufficient documentation

## 2013-11-16 DIAGNOSIS — I10 Essential (primary) hypertension: Secondary | ICD-10-CM | POA: Insufficient documentation

## 2013-11-16 DIAGNOSIS — I70219 Atherosclerosis of native arteries of extremities with intermittent claudication, unspecified extremity: Secondary | ICD-10-CM | POA: Insufficient documentation

## 2013-11-16 NOTE — Progress Notes (Signed)
LEA Doppler performed 

## 2013-11-26 ENCOUNTER — Ambulatory Visit (INDEPENDENT_AMBULATORY_CARE_PROVIDER_SITE_OTHER): Payer: PRIVATE HEALTH INSURANCE | Admitting: *Deleted

## 2013-11-26 ENCOUNTER — Other Ambulatory Visit: Payer: Self-pay | Admitting: Physician Assistant

## 2013-11-26 DIAGNOSIS — I498 Other specified cardiac arrhythmias: Secondary | ICD-10-CM

## 2013-11-26 DIAGNOSIS — R001 Bradycardia, unspecified: Secondary | ICD-10-CM

## 2013-11-26 LAB — MDC_IDC_ENUM_SESS_TYPE_REMOTE
Battery Remaining Longevity: 92 mo
Battery Voltage: 3.01 V
Brady Statistic RA Percent Paced: 100 %
Implantable Pulse Generator Model: 2240
Lead Channel Impedance Value: 490 Ohm
Lead Channel Impedance Value: 690 Ohm
Lead Channel Sensing Intrinsic Amplitude: 2.1 mV
MDC IDC MSMT LEADCHNL RV SENSING INTR AMPL: 7.5 mV
MDC IDC PG SERIAL: 7536042
MDC IDC SET LEADCHNL RA PACING AMPLITUDE: 2 V
MDC IDC SET LEADCHNL RV PACING AMPLITUDE: 3.5 V
MDC IDC SET LEADCHNL RV PACING PULSEWIDTH: 0.4 ms
MDC IDC SET LEADCHNL RV SENSING SENSITIVITY: 2 mV
MDC IDC STAT BRADY RV PERCENT PACED: 3.6 %

## 2013-11-26 NOTE — Telephone Encounter (Signed)
Please Advise on refills/SLS  

## 2013-11-27 NOTE — Progress Notes (Signed)
Remote pacemaker transmission.   

## 2013-12-03 ENCOUNTER — Other Ambulatory Visit: Payer: Self-pay | Admitting: Internal Medicine

## 2014-01-17 ENCOUNTER — Encounter: Payer: Self-pay | Admitting: Cardiology

## 2014-01-21 ENCOUNTER — Ambulatory Visit: Payer: PRIVATE HEALTH INSURANCE | Admitting: Physician Assistant

## 2014-01-23 ENCOUNTER — Encounter: Payer: Self-pay | Admitting: Physician Assistant

## 2014-01-23 ENCOUNTER — Ambulatory Visit (INDEPENDENT_AMBULATORY_CARE_PROVIDER_SITE_OTHER): Payer: PRIVATE HEALTH INSURANCE | Admitting: Physician Assistant

## 2014-01-23 VITALS — BP 122/66 | HR 63 | Temp 98.1°F | Resp 16 | Ht 68.0 in | Wt 203.0 lb

## 2014-01-23 DIAGNOSIS — R82998 Other abnormal findings in urine: Secondary | ICD-10-CM

## 2014-01-23 DIAGNOSIS — R338 Other retention of urine: Secondary | ICD-10-CM

## 2014-01-23 DIAGNOSIS — R3989 Other symptoms and signs involving the genitourinary system: Secondary | ICD-10-CM

## 2014-01-23 DIAGNOSIS — R399 Unspecified symptoms and signs involving the genitourinary system: Secondary | ICD-10-CM

## 2014-01-23 DIAGNOSIS — R8290 Unspecified abnormal findings in urine: Secondary | ICD-10-CM

## 2014-01-23 LAB — POCT URINALYSIS DIPSTICK
GLUCOSE UA: NEGATIVE
Nitrite, UA: POSITIVE
SPEC GRAV UA: 1.02
Urobilinogen, UA: 0.2
pH, UA: 6.5

## 2014-01-23 MED ORDER — CIPROFLOXACIN HCL 250 MG PO TABS: 250.0000 mg | ORAL_TABLET | Freq: Two times a day (BID) | ORAL | Status: DC

## 2014-01-23 NOTE — Patient Instructions (Signed)
Please follow-up with Urology tomorrow as scheduled for evaluation and possible catheter removal.  Please inform your Urologist of your Urinary Tract Infection and the new antibiotic you are taking.  I will call you with your urine culture results.  Follow-up with me in 1 month.

## 2014-01-23 NOTE — Progress Notes (Signed)
Patient presents to clinic today for follow-up of urinary retention and gross hematuria.  Patient was seen in ER for anuria.  Catheter was placed with good output.  Some mild blood noted in urine. Patient was sent to Urologist for evaluation and catheter removal.  Patient states he saw the Urologist but Catheter was not taken out as the ER scheduled the patient's appointment at the end of the day, and the Urologist did not want the patient to have to go back to the ER if he could not void on his own.  Has an appointment tomorrow morning.  Patient's ASA and Aleve were placed on hold due to hematuria until Urological evaluation is complete. Patient states he is doing well except for a burning sensation in his penis and some mild fatigue.  Denies SOB.  Denies continued hematuria. Is starting a prescription for Flomax this evening given by the Urologist.  Past Medical History  Diagnosis Date  . Near syncope   . Sinus bradycardia   . Diastolic murmur   . Hypertension   . Renal insufficiency   . History of tobacco abuse   . Syncope     Current Outpatient Prescriptions on File Prior to Visit  Medication Sig Dispense Refill  . atorvastatin (LIPITOR) 10 MG tablet TAKE 1 TABLET BY MOUTH ONCE DAILY.  30 tablet  3   No current facility-administered medications on file prior to visit.    No Known Allergies  Family History  Problem Relation Age of Onset  . Heart disease Father     Deceased-90s  . Cancer Father   . Stroke Mother 32    Deceased  . Heart disease Mother   . Dementia Mother   . Healthy Brother     x1  . Hypertension Daughter     x1    History   Social History  . Marital Status: Single    Spouse Name: N/A    Number of Children: N/A  . Years of Education: N/A   Social History Main Topics  . Smoking status: Former Smoker -- 3.00 packs/day for 39 years    Types: Cigarettes    Quit date: 06/14/1996  . Smokeless tobacco: Never Used     Comment: use to smoke 3 packs per day  for 6 years quit in 1993  . Alcohol Use: No  . Drug Use: No  . Sexual Activity: No   Other Topics Concern  . None   Social History Narrative  . None    Review of Systems - See HPI.  All other ROS are negative.  BP 122/66  Pulse 63  Temp(Src) 98.1 F (36.7 C) (Oral)  Resp 16  Ht 5\' 8"  (1.727 m)  Wt 203 lb (92.08 kg)  BMI 30.87 kg/m2  SpO2 98%  Physical Exam  Vitals reviewed. Constitutional: He is oriented to person, place, and time and well-developed, well-nourished, and in no distress.  HENT:  Head: Normocephalic and atraumatic.  Eyes: Conjunctivae are normal.  Cardiovascular: Normal rate, regular rhythm, normal heart sounds and intact distal pulses.   Pulmonary/Chest: Effort normal and breath sounds normal. No respiratory distress. He has no wheezes. He has no rales. He exhibits no tenderness.  Genitourinary: Penis normal.  Foley catheter in place.  Neurological: He is alert and oriented to person, place, and time.  Skin: Skin is warm and dry. No rash noted.  Psychiatric: Affect normal.   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     Pulse Gen Model 2240 Assurity DR     Pulse Gen Serial Number [id number]     RV Sense Sensitivity 2     RA Pace Amplitude 2     RV Pace PulseWidth 0.4     RV Pace Amplitude 3.5     RA Impedance 490     RA Amplitude 2.1     RV IMPEDANCE 690     RV Amplitude 7.5     Battery Longevity 92     Battery Voltage 3.01     Brady RA Perc Paced 100     Brady RV Perc Paced 3.6     Eval Rhythm Ap-Vs     Miscellaneous Comment       Value: Pacemaker remote check. Device function reviewed. Impedance, sensing, auto capture thresholds consistent with previous measurements. Histograms appropriate for patient and level of activity. All other diagnostic data reviewed and is appropriate and       stable for patient. Real time/magnet EGM shows appropriate sensing and capture. 1 mode switch, <1%.  9 PMT episodes. Estimated longevity 7.8 years. Plan to follow in 3 months remotely, to see in office annually.  ROV in October with Dr. Johney FrameAllred.  CULTURE, URINE COMPREHENSIVE     Status: None   Collection Time    01/23/14 11:01 AM      Result Value Ref Range   Preliminary Report Culture reincubated for better growth    POCT URINALYSIS DIPSTICK     Status: Abnormal   Collection Time    01/23/14 11:02 AM      Result Value Ref Range   Color, UA Dark Gold     Comment: Taken from Catheter   Clarity, UA Murky     Glucose, UA neg     Bilirubin, UA small     Ketones, UA trace     Spec Grav, UA 1.020     Blood, UA Moderate     pH, UA 6.5     Protein, UA 30+     Urobilinogen, UA 0.2     Nitrite, UA Positive     Leukocytes, UA large (3+)     Assessment/Plan: UTI symptoms Urine dip positive for blood, nitrites and LE.  Will send for culture.  Will empirically treat with Cipro 250 x 3 days as patient will be seeing Urologist in the morning.  Stay well hydrated.  Take a cranberry supplement and a Probiotic.  Acute urinary retention Foley in place and draining well. Start Flomax this evening as directed by Urologist.  Follow-up with them in the morning as scheduled for further studies and possible catheter removal.

## 2014-01-23 NOTE — Progress Notes (Signed)
Pre visit review using our clinic review tool, if applicable. No additional management support is needed unless otherwise documented below in the visit note/SLS  

## 2014-01-25 DIAGNOSIS — R338 Other retention of urine: Secondary | ICD-10-CM | POA: Insufficient documentation

## 2014-01-25 DIAGNOSIS — R399 Unspecified symptoms and signs involving the genitourinary system: Secondary | ICD-10-CM | POA: Insufficient documentation

## 2014-01-25 NOTE — Assessment & Plan Note (Signed)
Urine dip positive for blood, nitrites and LE.  Will send for culture.  Will empirically treat with Cipro 250 x 3 days as patient will be seeing Urologist in the morning.  Stay well hydrated.  Take a cranberry supplement and a Probiotic.

## 2014-01-25 NOTE — Assessment & Plan Note (Signed)
Foley in place and draining well. Start Flomax this evening as directed by Urologist.  Follow-up with them in the morning as scheduled for further studies and possible catheter removal.

## 2014-01-27 LAB — CULTURE, URINE COMPREHENSIVE: Colony Count: >=100000

## 2014-02-05 ENCOUNTER — Encounter: Payer: Self-pay | Admitting: Internal Medicine

## 2014-02-11 ENCOUNTER — Telehealth: Payer: Self-pay | Admitting: Physician Assistant

## 2014-02-11 NOTE — Telephone Encounter (Signed)
Caller name: Donette Larry Relation to pt: other  Call back number: [phone]   Reason for call: Requesting results taken  01/23/2014

## 2014-02-13 NOTE — Telephone Encounter (Signed)
Spoke with Malka So. She states pt no longer on antibiotic. Was given Flomax and states it makes pt dizzy. She states they will call pt's urologist to arrange follow up with their office.

## 2014-02-22 ENCOUNTER — Ambulatory Visit: Payer: PRIVATE HEALTH INSURANCE | Admitting: Physician Assistant

## 2014-02-27 ENCOUNTER — Encounter: Payer: Self-pay | Admitting: Physician Assistant

## 2014-02-27 ENCOUNTER — Ambulatory Visit (INDEPENDENT_AMBULATORY_CARE_PROVIDER_SITE_OTHER): Payer: PRIVATE HEALTH INSURANCE | Admitting: Physician Assistant

## 2014-02-27 VITALS — BP 123/62 | HR 64 | Temp 98.3°F | Resp 16 | Ht 68.0 in | Wt 210.5 lb

## 2014-02-27 DIAGNOSIS — L219 Seborrheic dermatitis, unspecified: Secondary | ICD-10-CM | POA: Insufficient documentation

## 2014-02-27 DIAGNOSIS — R338 Other retention of urine: Secondary | ICD-10-CM

## 2014-02-27 DIAGNOSIS — I10 Essential (primary) hypertension: Secondary | ICD-10-CM

## 2014-02-27 DIAGNOSIS — Z23 Encounter for immunization: Secondary | ICD-10-CM | POA: Insufficient documentation

## 2014-02-27 DIAGNOSIS — R413 Other amnesia: Secondary | ICD-10-CM | POA: Insufficient documentation

## 2014-02-27 NOTE — Assessment & Plan Note (Signed)
MMSE performed with score of 16 indicating severe impairment.  Previous labs have been unremarkable.  Referral placed to Neurology for further assessment.

## 2014-02-27 NOTE — Assessment & Plan Note (Signed)
Resolved. Catheter has been removed by Urology.  Endorses good urinary output despite discontinuation of Flomax.  Follow-up with Urology as scheduled.

## 2014-02-27 NOTE — Progress Notes (Signed)
Pre visit review using our clinic review tool, if applicable. No additional management support is needed unless otherwise documented below in the visit note/SLS  

## 2014-02-27 NOTE — Assessment & Plan Note (Signed)
Flu shot given by nursing staff. 

## 2014-02-27 NOTE — Progress Notes (Signed)
Patient presents to clinic today for 1 month follow-up.  Patient was seen for ER follow-up at last visit.  Was treated for UTI and sent to follow-up with Urology for foley catheter removal and further assessment of anuria.  Patient endorses completing antibiotic as directed.  Was seen by Urology last week and catheter was removed.  Patient's Flomax was discontinued by Urologist due to side effect of lightheadedness. Patient endorses good urinary output despite stopping medication.  Denies new concern at today's visit.  Will be getting flu shot.  Nursing home attendant has concerns over patient's memory.  Notes increasing difficulty with short-term memory since first meeting the patient several months ago.  Daughter has note relaying similar concerns.  Patient also endorses difficulty   Past Medical History  Diagnosis Date  . Near syncope   . Sinus bradycardia   . Diastolic murmur   . Hypertension   . Renal insufficiency   . History of tobacco abuse   . Syncope     Current Outpatient Prescriptions on File Prior to Visit  Medication Sig Dispense Refill  . atorvastatin (LIPITOR) 10 MG tablet TAKE 1 TABLET BY MOUTH ONCE DAILY.  30 tablet  3  . lisinopril (PRINIVIL,ZESTRIL) 10 MG tablet Take 10 mg by mouth daily.      . tamsulosin (FLOMAX) 0.4 MG CAPS capsule Take 0.4 mg by mouth daily. PATIENT TO START MEDICATION 08.13.15       No current facility-administered medications on file prior to visit.    No Known Allergies  Family History  Problem Relation Age of Onset  . Heart disease Father     Deceased-90s  . Cancer Father   . Stroke Mother 21    Deceased  . Heart disease Mother   . Dementia Mother   . Healthy Brother     x1  . Hypertension Daughter     x1    History   Social History  . Marital Status: Single    Spouse Name: N/A    Number of Children: N/A  . Years of Education: N/A   Social History Main Topics  . Smoking status: Former Smoker -- 3.00 packs/day for 39  years    Types: Cigarettes    Quit date: 06/14/1996  . Smokeless tobacco: Never Used     Comment: use to smoke 3 packs per day for 6 years quit in 1993  . Alcohol Use: No  . Drug Use: No  . Sexual Activity: No   Other Topics Concern  . None   Social History Narrative  . None   Review of Systems - See HPI.  All other ROS are negative.  BP 123/62  Pulse 64  Temp(Src) 98.3 F (36.8 C) (Oral)  Resp 16  Ht  (1.727 m)  Wt 210 lb 8 oz (95.482 kg)  BMI 32.01 kg/m2  SpO2 100%  Physical Exam  Vitals reviewed. Constitutional: He is oriented to person, place, and time and well-developed, well-nourished, and in no distress.  HENT:  Head: Normocephalic and atraumatic.  Right Ear: External ear normal.  Left Ear: External ear normal.  Nose: Nose normal.  Mouth/Throat: Oropharynx is clear and moist. No oropharyngeal exudate.  Eyes: Conjunctivae are normal. Pupils are equal, round, and reactive to light.  Neck: Neck supple.  Cardiovascular: Normal rate, regular rhythm, normal heart sounds and intact distal pulses.   Pulmonary/Chest: Effort normal and breath sounds normal. No respiratory distress. He has no wheezes. He has no rales. He exhibits  no tenderness.  Abdominal:  Negative CVA tenderness.  Lymphadenopathy:    He has no cervical adenopathy.  Neurological: He is alert and oriented to person, place, and time. He has intact cranial nerves.  Skin: Skin is warm and dry.  Presence of yellow greasy and scaling of skin of forehead, consistent with seborrheic dermatitis.  Psychiatric: Affect and judgment normal. His mood appears not anxious. He does not exhibit a depressed mood. He exhibits abnormal new learning ability and abnormal recent memory. He exhibits normal remote memory.    Recent Results (from the past 2160 hour(s))  CULTURE, URINE COMPREHENSIVE     Status: None   Collection Time    01/23/14 11:01 AM      Result Value Ref Range   Culture       Value: KLEBSIELLA  PNEUMONIAE     PSEUDOMONAS AERUGINOSA   Colony Count >=100,000 COLONIES/ML     Organism ID, Bacteria KLEBSIELLA PNEUMONIAE     Comment: Confirmed Extended Spectrum Beta-Lactamase     Producer (ESBL)   Organism ID, Bacteria PSEUDOMONAS AERUGINOSA    POCT URINALYSIS DIPSTICK     Status: Abnormal   Collection Time    01/23/14 11:02 AM      Result Value Ref Range   Color, UA Dark Gold     Comment: Taken from Catheter   Clarity, UA Murky     Glucose, UA neg     Bilirubin, UA small     Ketones, UA trace     Spec Grav, UA 1.020     Blood, UA Moderate     pH, UA 6.5     Protein, UA 30+     Urobilinogen, UA 0.2     Nitrite, UA Positive     Leukocytes, UA large (3+)     Assessment/Plan: Essential hypertension Doing well. Continue current regimen.   Acute urinary retention Resolved. Catheter has been removed by Urology.  Endorses good urinary output despite discontinuation of Flomax.  Follow-up with Urology as scheduled.  Need for prophylactic vaccination and inoculation against influenza Flu shot given by nursing staff.  Seborrheic dermatitis Encouraged anti-dandruff shampoo (zinc oxide) like Head and Shoulders. Apply daily with each shower.  Let sit for 1-2 minutes before washing off.  Pat dry and apply moisturizer.  May need Rx medication if not improving.  Follow-up PRN.  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  Previous labs have been unremarkable.  Referral placed to Neurology for further assessment.

## 2014-02-27 NOTE — Assessment & Plan Note (Signed)
Doing well. Continue current regimen.  

## 2014-02-27 NOTE — Assessment & Plan Note (Signed)
Encouraged anti-dandruff shampoo (zinc oxide) like Head and Shoulders. Apply daily with each shower.  Let sit for 1-2 minutes before washing off.  Pat dry and apply moisturizer.  May need Rx medication if not improving.  Follow-up PRN.

## 2014-02-28 ENCOUNTER — Telehealth: Payer: Self-pay

## 2014-02-28 NOTE — Telephone Encounter (Signed)
error 

## 2014-02-28 NOTE — Telephone Encounter (Signed)
Please advise 

## 2014-02-28 NOTE — Telephone Encounter (Signed)
Harley  [facility]  [phone]  Harley called for the Med Tech at [facility] and said that the order for dandruff shampoo that Ivan brought back yesterday was not complete. Could someone call and assist with this.      Sending to RN PCP out office  

## 2014-02-28 NOTE — Telephone Encounter (Signed)
So I guess we just need to know what they need. Do they need to send [place] an order form, can we print an order in our system in meds and they pick it up. I am unclear what they need.

## 2014-02-28 NOTE — Telephone Encounter (Signed)
Can not have a verbal order.   Called back and states that they can not fax anything either.   [phone].

## 2014-02-28 NOTE — Telephone Encounter (Signed)
[facility]  [phone]  Lane Hacker called for the Med Tech at [facility] and said that the order for dandruff shampoo that Yi brought back yesterday was not complete. Could someone call and assist with this.      Sending to RN PCP out office

## 2014-02-28 NOTE — Telephone Encounter (Signed)
Spoke with Lane Hacker and read Malva Cogan, PA-C's instructions from pt's assessment and plan.  Lane Hacker stated that they needed a signed version of the full order.  Requested that she send over a fax requesting order.  Lane Hacker stated that she was unsure if she could do that and stated that she would check and call back.

## 2014-03-01 NOTE — Telephone Encounter (Signed)
There was no prescription given.  I had recommended that he use Head and Shoulders shampoo for his seborrheic dermatitis.  I can write a prescription but it is not necessary.

## 2014-03-01 NOTE — Telephone Encounter (Signed)
Spoke with Sonia Side, Med Tech on staff today, and she stated that they need a new prescription written that includes frequency, refills and stop date.    Please advise.

## 2014-03-01 NOTE — Telephone Encounter (Signed)
Letter/Physician Order faxed per Judithann Sauger at [facility] to [phone]

## 2014-03-01 NOTE — Telephone Encounter (Signed)
See below.  Lets call the home back to see if he still needs Rx for an OTC shampoo.

## 2014-03-21 ENCOUNTER — Other Ambulatory Visit: Payer: Self-pay | Admitting: Physician Assistant

## 2014-04-09 ENCOUNTER — Encounter: Payer: Self-pay | Admitting: Neurology

## 2014-04-09 ENCOUNTER — Ambulatory Visit (INDEPENDENT_AMBULATORY_CARE_PROVIDER_SITE_OTHER): Payer: PRIVATE HEALTH INSURANCE | Admitting: Neurology

## 2014-04-09 VITALS — BP 128/60 | HR 67 | Resp 16 | Ht 67.75 in | Wt 213.0 lb

## 2014-04-09 DIAGNOSIS — R413 Other amnesia: Secondary | ICD-10-CM

## 2014-04-09 LAB — VITAMIN B12: VITAMIN B 12: 328 pg/mL (ref 211–911)

## 2014-04-09 NOTE — Patient Instructions (Addendum)
1. CT head without contrast 2. Bloodwork for B12 3. Start Aricept 10mg : Take 1/2 tablet daily for 1 week, then increase to 1 tablet daily 4. Follow-up in 3 months

## 2014-04-09 NOTE — Progress Notes (Signed)
NEUROLOGY CONSULTATION NOTE  Cody Antiserry L Sedgwick MRN: [id number] DOB: July 17, 1940  Referring provider: Marcelline MatesWilliam Martin, PA-C Primary care provider: Marcelline MatesWilliam Martin, PA-C  Reason for consult:  Memory loss  Thank you for your kind referral of Cody Vincent for consultation of the above symptoms. Although his history is well known to you, please allow me to reiterate it for the purpose of our medical record. The patient was accompanied to the clinic by group home transportation staff who also provides collateral information. Both are poor historians. There is no family accompanying him today. Records and images were personally reviewed where available.  HISTORY OF PRESENT ILLNESS: This is a pleasant 73 year old right-handed man with a history of hypertension, hyperlipidemia, sinus bradycardia s/p pacemaker placement, presenting for evaluation of memory loss. There is no family with him today. Patient is a poor historian. Records from PCP were reviewed, it was noted that nursing home attendant had concerns over the patient's memory, with note of increasing difficulty with short-term memory since first meeting patient several months ago. Daughter has note relaying similar concerns. According to the patient, he has been living in the SNF for less than a year. Prior to that, he had been living in a motel by himself for a year, and appears to have [place] [facility]. His daughter took his car, he denied getting lost driving. Staff reports that he has difficulties with dates, and would say that his daughter did not come to visit when she in fact did. Staff feels he is doing better with his new roommate. He denies any dizziness, headaches, diplopia, dysarthria, dysphagia, neck/back pain, focal numbness/tingling/weakness, bowel/bladder dysfunction. He has occasional tremors which do not affect using utensils. He reports stopping school at age 73 (10th grade).  Laboratory Data: Lab Results    Component Value Date   WBC 6.7 10/11/2013   HGB 12.1* 10/11/2013   HCT 34.8* 10/11/2013   MCV 86.4 10/11/2013   PLT 198 10/11/2013     Chemistry      Component Value Date/Time   NA 138 10/11/2013 1546   K 4.1 10/11/2013 1546   CL 106 10/11/2013 1546   CO2 26 10/11/2013 1546   BUN 22 10/11/2013 1546   CREATININE 1.16 10/11/2013 1546   CREATININE 1.50* 09/08/2013 1140      Component Value Date/Time   CALCIUM 9.1 10/11/2013 1546   ALKPHOS 98 09/08/2013 1140   AST 17 09/08/2013 1140   ALT 12 09/08/2013 1140   BILITOT 0.5 09/08/2013 1140     Lab Results  Component Value Date   TSH 2.101 10/11/2013    PAST MEDICAL HISTORY: Past Medical History  Diagnosis Date  . Near syncope   . Sinus bradycardia   . Diastolic murmur   . Hypertension   . Renal insufficiency   . History of tobacco abuse   . Syncope     PAST SURGICAL HISTORY: Past Surgical History  Procedure Laterality Date  . Pacemaker insertion  03/19/13    SJM pacemaker implanted for symptomatic sinus bradycardia by Dr Johney FrameAllred  . Kidney stone surgery  06/2013  . Multiple tooth extractions      Full Dentures    MEDICATIONS: Current Outpatient Prescriptions on File Prior to Visit  Medication Sig Dispense Refill  . atorvastatin (LIPITOR) 10 MG tablet TAKE 1 TABLET BY MOUTH ONCE DAILY.  30 tablet  3  . lisinopril (PRINIVIL,ZESTRIL) 10 MG tablet TAKE 1 TABLET BY MOUTH ONCE DAILY.  30 tablet  0   No current facility-administered medications on file prior to visit.    ALLERGIES: No Known Allergies  FAMILY HISTORY: Family History  Problem Relation Age of Onset  . Heart disease Father     Deceased-90s  . Cancer Father   . Stroke Mother 1290    Deceased  . Heart disease Mother   . Dementia Mother   . Healthy Brother     x1  . Hypertension Daughter     x1    SOCIAL HISTORY: History   Social History  . Marital Status: Single    Spouse Name: N/A    Number of Children: N/A  . Years of Education: N/A   Occupational  History  . Not on file.   Social History Main Topics  . Smoking status: Former Smoker -- 3.00 packs/day for 39 years    Types: Cigarettes    Quit date: 06/14/1996  . Smokeless tobacco: Never Used     Comment: use to smoke 3 packs per day for 6 years quit in 1993  . Alcohol Use: No  . Drug Use: No  . Sexual Activity: No   Other Topics Concern  . Not on file   Social History Narrative  . No narrative on file    REVIEW OF SYSTEMS: Constitutional: No fevers, chills, or sweats, no generalized fatigue, change in appetite Eyes: No visual changes, double vision, eye pain Ear, nose and throat: No hearing loss, ear pain, nasal congestion, sore throat Cardiovascular: No chest pain, palpitations Respiratory:  No shortness of breath at rest or with exertion, wheezes GastrointestinaI: No nausea, vomiting, diarrhea, abdominal pain, fecal incontinence Genitourinary:  No dysuria, urinary retention or frequency Musculoskeletal:  No neck pain, back pain Integumentary: No rash, pruritus, skin lesions Neurological: as above Psychiatric: No depression, insomnia, anxiety Endocrine: No palpitations, fatigue, diaphoresis, mood swings, change in appetite, change in weight, increased thirst Hematologic/Lymphatic:  No anemia, purpura, petechiae. Allergic/Immunologic: no itchy/runny eyes, nasal congestion, recent allergic reactions, rashes  PHYSICAL EXAM: Filed Vitals:   04/09/14 1050  BP: 128/60  Pulse: 67  Resp: 16   General: No acute distress Head:  Normocephalic/atraumatic Eyes: Fundoscopic exam shows bilateral sharp discs, no vessel changes, exudates, or hemorrhages Neck: supple, no paraspinal tenderness, full range of motion Back: No paraspinal tenderness Heart: regular rate and rhythm Lungs: Clear to auscultation bilaterally. Vascular: No carotid bruits. Skin/Extremities: No rash, no edema Neurological Exam: Mental status: alert and oriented to person, place, and time, no dysarthria or  aphasia, Fund of knowledge is appropriate.  Recent and remote memory are decreased. Attention and concentration are normal.    Able to name objects and repeat phrases.  MMSE - Mini Mental State Exam 04/09/2014  Orientation to time 0  Orientation to Place 5  Registration 3  Attention/ Calculation 0  Recall 3  Language- name 2 objects 2  Language- repeat 1  Language- follow 3 step command 1  Language- read & follow direction 1  Write a sentence 1  Copy design 1  Total score 18   Cranial nerves: CN I: not tested CN II: pupils equal, round and reactive to light, visual fields intact, fundi unremarkable. CN III, IV, VI:  full range of motion, no nystagmus, no ptosis CN V: facial sensation intact CN VII: upper and lower face symmetric CN VIII: hearing intact to finger rub CN IX, X: gag intact, uvula midline CN XI: sternocleidomastoid and trapezius muscles intact CN XII: tongue midline Bulk & Tone: normal, no cogwheeling,  no fasciculations. Motor: 5/5 throughout with no pronator drift. Sensation: intact to light touch, cold, pin, vibration and joint position sense.  No extinction to double simultaneous stimulation.  Romberg test negative Deep Tendon Reflexes: +2 throughout, no ankle clonus Plantar responses: downgoing bilaterally Cerebellar: no incoordination on finger to nose, heel to shin. No dysdiadochokinesia Gait: narrow-based and steady, able to tandem walk adequately. Tremor: none noted today  IMPRESSION: This is a 73 year old right-handed man with a history of hypertension, hyperlipidemia, sinus bradycardia s/p pacemaker placement, presenting for evaluation of memory loss.  The patient is a poor historian, there is no family available today. His MMSE today is 18/30, otherwise non-focal exam. Patient likely has dementia, CT head without contrast will be ordered. He is unable to obtain MRI due to pacemaker. Check B12 level. He now lives in a nursing home, and may benefit from  starting cholinesterase inhibitors such as Aricept. Side effects were discussed. He will follow-up in 3 months.  Thank you for allowing me to participate in the care of this patient. Please do not hesitate to call for any questions or concerns.   Patrcia Dolly, M.D.

## 2014-04-10 ENCOUNTER — Telehealth: Payer: Self-pay | Admitting: Family Medicine

## 2014-04-10 ENCOUNTER — Other Ambulatory Visit: Payer: Self-pay | Admitting: Family Medicine

## 2014-04-10 ENCOUNTER — Encounter: Payer: Self-pay | Admitting: Neurology

## 2014-04-10 DIAGNOSIS — R413 Other amnesia: Secondary | ICD-10-CM

## 2014-04-10 NOTE — Telephone Encounter (Signed)
[facility]ed [facility] & spoke with Tammy. Notified that pts MRI has been changed to CT head due to patient having a pacemaker. The time & date is still the same, but patient needs to be npo 4 hours before scan.

## 2014-04-18 ENCOUNTER — Telehealth: Payer: Self-pay | Admitting: Physician Assistant

## 2014-04-18 NOTE — Telephone Encounter (Signed)
Caller name: [facility] Relation to pt: Call back number:  [phone] Pharmacy:  Reason for call:   Please call porsha regarding head and shoulders. Patient states that she needs a d/c order for this because patient refuses this.

## 2014-04-18 NOTE — Telephone Encounter (Signed)
Ok to send d/c order for the head and shoulders shampoo.

## 2014-04-19 ENCOUNTER — Telehealth: Payer: Self-pay | Admitting: Physician Assistant

## 2014-04-19 NOTE — Telephone Encounter (Signed)
Physician Order with provider reply [facility]ed to [facility], Attn: Porsha/SLS

## 2014-04-19 NOTE — Telephone Encounter (Signed)
Caller name:[facility] Relation to AV:WUJWpt:nine Call back number:[phone] Pharmacy:  Reason for call: need verification on the directions for rx nasal spray, states the order was not complete, for nasal spray and mucinex. Also would like to get standard orders for the pt as well. Please fax to [phone]

## 2014-04-19 NOTE — Telephone Encounter (Signed)
Patient needs to be evaluated before Rx medication can be prescribed.  Increase fluid intake. Ok to start Saline Nasal Spray. Can use plain Mucinex every 12 hours.

## 2014-04-19 NOTE — Telephone Encounter (Signed)
Caller name: Daniel [facility]ortia from [facility] Relation to pt: Call back number: [phone] Pharmacy:  Reason for call:   Daniel Nonesortia states that patient has been congested, body aches, weak, sneezing and coughing. Daniel Nonesortia would like something called in for patient. Fax: [phone]

## 2014-04-19 NOTE — Telephone Encounter (Signed)
Physician Order to D/C use of Head & Shoulders shampoo faxed to [facility], Attn: Porsha/SLS

## 2014-04-19 NOTE — Telephone Encounter (Signed)
Please Advise

## 2014-04-19 NOTE — Telephone Encounter (Signed)
Physician Order with OTC medication instructions [facility]; per provider VO: Saline Nasal spray Sig - Use [1] squirt to each nostril twice daily as needed for congestion; Mucinex [plain] Sig - Use [1] tablet every 12 hours as needed for chest congestion/SLS

## 2014-04-22 ENCOUNTER — Encounter: Payer: Self-pay | Admitting: Physician Assistant

## 2014-04-22 ENCOUNTER — Other Ambulatory Visit: Payer: Self-pay | Admitting: *Deleted

## 2014-04-22 ENCOUNTER — Other Ambulatory Visit: Payer: Self-pay | Admitting: Physician Assistant

## 2014-04-22 ENCOUNTER — Other Ambulatory Visit: Payer: Self-pay | Admitting: Neurology

## 2014-04-22 ENCOUNTER — Ambulatory Visit (INDEPENDENT_AMBULATORY_CARE_PROVIDER_SITE_OTHER): Payer: PRIVATE HEALTH INSURANCE | Admitting: Physician Assistant

## 2014-04-22 ENCOUNTER — Telehealth: Payer: Self-pay | Admitting: Physician Assistant

## 2014-04-22 VITALS — BP 128/59 | HR 62 | Temp 98.0°F | Resp 16 | Ht 68.0 in | Wt 212.2 lb

## 2014-04-22 DIAGNOSIS — H65192 Other acute nonsuppurative otitis media, left ear: Secondary | ICD-10-CM | POA: Insufficient documentation

## 2014-04-22 DIAGNOSIS — J208 Acute bronchitis due to other specified organisms: Secondary | ICD-10-CM | POA: Insufficient documentation

## 2014-04-22 MED ORDER — AMOXICILLIN 875 MG PO TABS: 875.0000 mg | ORAL_TABLET | Freq: Two times a day (BID) | ORAL | Status: DC

## 2014-04-22 NOTE — Progress Notes (Signed)
Pre visit review using our clinic review tool, if applicable. No additional management support is needed unless otherwise documented below in the visit note/SLS  

## 2014-04-22 NOTE — Progress Notes (Signed)
  Subjective:     Cody Vincent L Mongeau is a 73 y.o. male who presents for evaluation of symptoms of a URI, possible sinusitis. Symptoms include bilateral ear pressure/pain, achiness, congestion, coryza, facial pain, headache described as throbbing, worse with leaning forward, nasal congestion, post nasal drip, productive cough with  yellow colored sputum and sinus pressure. Onset of symptoms was 9 days ago, and has been gradually worsening since that time. Treatment to date: decongestants.  The following portions of the patient's history were reviewed and updated as appropriate: allergies, current medications, past family history, past medical history, past social history, past surgical history and problem list.  Review of Systems Pertinent items are noted in HPI.   Objective:    BP 128/59 mmHg  Pulse 62  Temp(Src) 98 F (36.7 C) (Oral)  Resp 16  Ht 5\' 8"  (1.727 m)  Wt 212 lb 4 oz (96.276 kg)  BMI 32.28 kg/m2  SpO2 100% General appearance: alert, cooperative, appears stated age and no distress Head: Normocephalic, without obvious abnormality, atraumatic Eyes: conjunctivae/corneas clear. PERRL, EOM's intact. Fundi benign. Ears: abnormal TM left ear - erythematous and bulging Nose: Nares normal. Septum midline. Mucosa normal. No drainage or sinus tenderness. Throat: lips, mucosa, and tongue normal; teeth and gums normal Lungs: clear to auscultation bilaterally Heart: regular rate and rhythm, S1, S2 normal, no murmur, click, rub or gallop   Assessment:    otitis media and viral upper respiratory illness   Plan:   Rx Amoxicillin 875 BID x 10 days.  Discussed diagnosis and treatment of URI. Suggested symptomatic OTC remedies. Nasal saline spray for congestion. Follow up as needed. Call in 7 days if symptoms aren't resolving.

## 2014-04-22 NOTE — Patient Instructions (Signed)
Instructions written on nursing home form and given to caretaker.

## 2014-04-22 NOTE — Telephone Encounter (Signed)
Spoke with Porscha.  AOM listed as a diagnosis. They needed clarification to verify that this stood for acute otitis media.

## 2014-04-22 NOTE — Telephone Encounter (Signed)
Caller name: porshia Relation to pt: Call back number: [phone] Pharmacy:  Reason for call:   Please call [facility]. They have questions regarding amoxicillan that was prescribed.

## 2014-04-24 ENCOUNTER — Ambulatory Visit (HOSPITAL_COMMUNITY): Payer: PRIVATE HEALTH INSURANCE

## 2014-04-24 ENCOUNTER — Ambulatory Visit (HOSPITAL_COMMUNITY)
Admission: RE | Admit: 2014-04-24 | Discharge: 2014-04-24 | Disposition: A | Discharge status: Home / Self Care | Payer: PRIVATE HEALTH INSURANCE | Source: Ambulatory Visit | Attending: Neurology | Admitting: Neurology

## 2014-04-24 DIAGNOSIS — R413 Other amnesia: Secondary | ICD-10-CM | POA: Insufficient documentation

## 2014-05-23 ENCOUNTER — Encounter (HOSPITAL_COMMUNITY): Payer: Self-pay | Admitting: Internal Medicine

## 2014-05-31 ENCOUNTER — Ambulatory Visit (INDEPENDENT_AMBULATORY_CARE_PROVIDER_SITE_OTHER): Payer: PRIVATE HEALTH INSURANCE | Admitting: Physician Assistant

## 2014-05-31 ENCOUNTER — Encounter: Payer: Self-pay | Admitting: Physician Assistant

## 2014-05-31 VITALS — BP 124/68 | HR 60 | Temp 97.9°F | Resp 16 | Wt 218.5 lb

## 2014-05-31 DIAGNOSIS — E785 Hyperlipidemia, unspecified: Secondary | ICD-10-CM

## 2014-05-31 DIAGNOSIS — I10 Essential (primary) hypertension: Secondary | ICD-10-CM

## 2014-05-31 DIAGNOSIS — Z23 Encounter for immunization: Secondary | ICD-10-CM

## 2014-05-31 LAB — CBC
HCT: 38.6 % — ABNORMAL LOW (ref 39.0–52.0)
Hemoglobin: 13.1 g/dL (ref 13.0–17.0)
MCHC: 33.8 g/dL (ref 30.0–36.0)
MCV: 87.1 fl (ref 78.0–100.0)
PLATELETS: 183 10*3/uL (ref 150.0–400.0)
RBC: 4.44 Mil/uL (ref 4.22–5.81)
RDW: 13.6 % (ref 11.5–15.5)
WBC: 8.5 10*3/uL (ref 4.0–10.5)

## 2014-05-31 LAB — COMPREHENSIVE METABOLIC PANEL
ALK PHOS: 98 U/L (ref 39–117)
ALT: 18 U/L (ref 0–53)
AST: 21 U/L (ref 0–37)
Albumin: 4 g/dL (ref 3.5–5.2)
BILIRUBIN TOTAL: 0.9 mg/dL (ref 0.2–1.2)
BUN: 17 mg/dL (ref 6–23)
CO2: 26 mEq/L (ref 19–32)
Calcium: 9.1 mg/dL (ref 8.4–10.5)
Chloride: 104 mEq/L (ref 96–112)
Creatinine, Ser: 1.1 mg/dL (ref 0.4–1.5)
GFR: 71.55 mL/min (ref >60.00)
GLUCOSE: 101 mg/dL — AB (ref 70–99)
Potassium: 4.3 mEq/L (ref 3.5–5.1)
SODIUM: 136 meq/L (ref 135–145)
Total Protein: 6.8 g/dL (ref 6.0–8.3)

## 2014-05-31 LAB — HEMOGLOBIN A1C: HEMOGLOBIN A1C: 5.8 % (ref 4.6–6.5)

## 2014-05-31 LAB — LIPID PANEL
CHOLESTEROL: 147 mg/dL (ref 0–200)
HDL: 33.6 mg/dL — AB (ref >39.00)
LDL Cholesterol: 81 mg/dL (ref 0–99)
NonHDL: 113.4
Total CHOL/HDL Ratio: 4
Triglycerides: 161 mg/dL — ABNORMAL HIGH (ref 0.0–149.0)
VLDL: 32.2 mg/dL (ref 0.0–40.0)

## 2014-05-31 MED ORDER — LOSARTAN POTASSIUM 50 MG PO TABS: 50.0000 mg | ORAL_TABLET | Freq: Every day | ORAL | Status: DC

## 2014-05-31 NOTE — Progress Notes (Signed)
Patient presents to clinic today for follow-up of hypertension and hyperlipidemia.  Patient endorses taking medications as directed.  Denies chest pain, palpitations, lightheadedness, dizziness, vision changes or frequent headaches.  Is staying active at the [employer]. Is excited about the upcoming Christmas party.  Patient due for repeat labs.  No acute concerns today.  Will be getting Prevnar.  Past Medical History  Diagnosis Date  . Near syncope   . Sinus bradycardia   . Diastolic murmur   . Hypertension   . Renal insufficiency   . History of tobacco abuse   . Syncope     Current Outpatient Prescriptions on File Prior to Visit  Medication Sig Dispense Refill  . atorvastatin (LIPITOR) 10 MG tablet TAKE 1 TABLET BY MOUTH ONCE DAILY. 30 tablet 3  . donepezil (ARICEPT) 10 MG tablet TAKE 1 TABLET BY MOUTH ONCE DAILY. 30 tablet 5  . sodium chloride (OCEAN) 0.65 % SOLN nasal spray Place 1 spray into both nostrils 2 (two) times daily as needed for congestion.     No current facility-administered medications on file prior to visit.    No Known Allergies  Family History  Problem Relation Age of Onset  . Heart disease Father     Deceased-90s  . Cancer Father   . Stroke Mother 57    Deceased  . Heart disease Mother   . Dementia Mother   . Healthy Brother     x1  . Hypertension Daughter     x1    History   Social History  . Marital Status: Single    Spouse Name: N/A    Number of Children: N/A  . Years of Education: N/A   Social History Main Topics  . Smoking status: Former Smoker -- 3.00 packs/day for 39 years    Types: Cigarettes    Quit date: 06/14/1996  . Smokeless tobacco: Never Used     Comment: use to smoke 3 packs per day for 6 years quit in 1993  . Alcohol Use: No  . Drug Use: No  . Sexual Activity: No   Other Topics Concern  . None   Social History Narrative   Review of Systems - See HPI.  All other ROS are negative.  BP 124/68 mmHg   Pulse 60  Temp(Src) 97.9 F (36.6 C) (Oral)  Resp 16  Wt 218 lb 8 oz (99.111 kg)  SpO2 97%  Physical Exam  Constitutional: He is well-developed, well-nourished, and in no distress.  HENT:  Head: Normocephalic and atraumatic.  Eyes: Conjunctivae are normal.  Neck: Neck supple.  Cardiovascular: Normal rate, regular rhythm, normal heart sounds and intact distal pulses.   Pulmonary/Chest: Effort normal and breath sounds normal. No respiratory distress. He has no wheezes. He has no rales. He exhibits no tenderness.  Neurological: He is alert.  Skin: Skin is warm and dry.  Vitals reviewed.   Recent Results (from the past 2160 hour(s))  Vitamin B12     Status: None   Collection Time: 04/09/14 12:03 PM  Result Value Ref Range   Vitamin B-12 328 211 - 911 pg/mL  CBC     Status: Abnormal   Collection Time: 05/31/14 11:41 AM  Result Value Ref Range   WBC 8.5 4.0 - 10.5 K/uL   RBC 4.44 4.22 - 5.81 Mil/uL   Platelets 183.0 150.0 - 400.0 K/uL   Hemoglobin 13.1 13.0 - 17.0 g/dL   HCT 38.6 (L) 39.0 - 52.0 %   MCV  87.1 78.0 - 100.0 fl   MCHC 33.8 30.0 - 36.0 g/dL   RDW 13.6 11.5 - 15.5 %  Comp Met (CMET)     Status: Abnormal   Collection Time: 05/31/14 11:41 AM  Result Value Ref Range   Sodium 136 135 - 145 mEq/L   Potassium 4.3 3.5 - 5.1 mEq/L   Chloride 104 96 - 112 mEq/L   CO2 26 19 - 32 mEq/L   Glucose, Bld 101 (H) 70 - 99 mg/dL   BUN 17 6 - 23 mg/dL   Creatinine, Ser 1.1 0.4 - 1.5 mg/dL   Total Bilirubin 0.9 0.2 - 1.2 mg/dL   Alkaline Phosphatase 98 39 - 117 U/L   AST 21 0 - 37 U/L   ALT 18 0 - 53 U/L   Total Protein 6.8 6.0 - 8.3 g/dL   Albumin 4.0 3.5 - 5.2 g/dL   Calcium 9.1 8.4 - 10.5 mg/dL   GFR 71.55 >60.00 mL/min  Hemoglobin A1c     Status: None   Collection Time: 05/31/14 11:41 AM  Result Value Ref Range   Hgb A1c MFr Bld 5.8 4.6 - 6.5 %    Comment: Glycemic Control Guidelines for People with Diabetes:Non Diabetic:  <6%Goal of Therapy: <7%Additional Action  Suggested:  >8%   Lipid Profile     Status: Abnormal   Collection Time: 05/31/14 11:41 AM  Result Value Ref Range   Cholesterol 147 0 - 200 mg/dL    Comment: ATP III Classification       Desirable:  < 200 mg/dL               Borderline High:  200 - 239 mg/dL          High:  > = 240 mg/dL   Triglycerides 161.0 (H) 0.0 - 149.0 mg/dL    Comment: Normal:  <150 mg/dLBorderline High:  150 - 199 mg/dL   HDL 33.60 (L) >39.00 mg/dL   VLDL 32.2 0.0 - 40.0 mg/dL   LDL Cholesterol 81 0 - 99 mg/dL   Total CHOL/HDL Ratio 4     Comment:                Men          Women1/2 Average Risk     3.4          3.3Average Risk          5.0          4.42X Average Risk          9.6          7.13X Average Risk          15.0          11.0                       NonHDL 113.40     Comment: NOTE:  Non-HDL goal should be 30 mg/dL higher than patient's LDL goal (i.e. LDL goal of < 70 mg/dL, would have non-HDL goal of < 100 mg/dL)    Assessment/Plan: Essential hypertension Doing well.  BP stable.  Continue current regimen.  DASH diet reiterated. Continue staying active. Will check labs today.  Follow-up in 6 months.  Hyperlipidemia Doing well.  Denies myalgias.  Will obtain repeat lipid panel and CMP.

## 2014-05-31 NOTE — Patient Instructions (Signed)
Please stop the Lisinopril and begin the Losartan daily.  Continue the other medications as directed.  Please stop by the lab for blood work.  I will call you with your results.  Hypertension Hypertension is another name for high blood pressure. High blood pressure forces your heart to work harder to pump blood. A blood pressure reading has two numbers, which includes a higher number over a lower number (example: 110/72). HOME CARE   Have your blood pressure rechecked by your doctor.  Only take medicine as told by your doctor. Follow the directions carefully. The medicine does not work as well if you skip doses. Skipping doses also puts you at risk for problems.  Do not smoke.  Monitor your blood pressure at home as told by your doctor. GET HELP IF:  You think you are having a reaction to the medicine you are taking.  You have repeat headaches or feel dizzy.  You have puffiness (swelling) in your ankles.  You have trouble with your vision. GET HELP RIGHT AWAY IF:   You get a very bad headache and are confused.  You feel weak, numb, or faint.  You get chest or belly (abdominal) pain.  You throw up (vomit).  You cannot breathe very well. MAKE SURE YOU:   Understand these instructions.  Will watch your condition.  Will get help right away if you are not doing well or get worse. Document Released: 11/17/2007 Document Revised: 06/05/2013 Document Reviewed: 03/23/2013 [facility] Patient Information 2015 [employer] [place]. This information is not intended to replace advice given to you by your health care provider. Make sure you discuss any questions you have with your health care provider.

## 2014-05-31 NOTE — Progress Notes (Signed)
Pre visit review using our clinic review tool, if applicable. No additional management support is needed unless otherwise documented below in the visit note. 

## 2014-06-02 NOTE — Assessment & Plan Note (Signed)
Doing well.  BP stable.  Continue current regimen.  DASH diet reiterated. Continue staying active. Will check labs today.  Follow-up in 6 months.

## 2014-06-02 NOTE — Assessment & Plan Note (Signed)
Doing well.  Denies myalgias.  Will obtain repeat lipid panel and CMP.

## 2014-06-24 ENCOUNTER — Encounter: Payer: Self-pay | Admitting: Physician Assistant

## 2014-06-24 ENCOUNTER — Ambulatory Visit (INDEPENDENT_AMBULATORY_CARE_PROVIDER_SITE_OTHER): Payer: Medicare Other | Admitting: Physician Assistant

## 2014-06-24 VITALS — BP 129/61 | HR 61 | Temp 98.1°F | Resp 16 | Ht 68.0 in | Wt 221.4 lb

## 2014-06-24 DIAGNOSIS — L219 Seborrheic dermatitis, unspecified: Secondary | ICD-10-CM

## 2014-06-24 MED ORDER — DESONIDE 0.05 % EX CREA: TOPICAL_CREAM | Freq: Two times a day (BID) | CUTANEOUS | Status: DC

## 2014-06-24 NOTE — Patient Instructions (Signed)
Please use desonide cream twice daily as directed for 2 weeks. Avoid using near eyes.  Seborrheic Dermatitis Seborrheic dermatitis involves pink or red skin with greasy, flaky scales. This is often found on the scalp, eyebrows, nose, bearded area, and on or behind the ears. It can also occur on the central chest. It often occurs where there are more oil (sebaceous) glands. This condition is also known as dandruff. When this condition affects a baby's scalp, it is called cradle cap. It may come and go for no known reason. It can occur at any time of life from infancy to old age. CAUSES  The cause is unknown. It is not the result of too little moisture or too much oil. In some people, seborrheic dermatitis flare-ups seem to be triggered by stress. It also commonly occurs in people with certain diseases such as Parkinson's disease or HIV/AIDS. SYMPTOMS   Thick scales on the scalp.  Redness on the face or in the armpits.  The skin may seem oily or dry, but moisturizers do not help.  In infants, seborrheic dermatitis appears as scaly redness that does not seem to bother the baby. In some babies, it affects only the scalp. In others, it also affects the neck creases, armpits, groin, or behind the ears.  In adults and adolescents, seborrheic dermatitis may affect only the scalp. It may look patchy or spread out, with areas of redness and flaking. Other areas commonly affected include:  Eyebrows.  Eyelids.  Forehead.  Skin behind the ears.  Outer ears.  Chest.  Armpits.  Nose creases.  Skin creases under the breasts.  Skin between the buttocks.  Groin.  Some adults and adolescents feel itching or burning in the affected areas. DIAGNOSIS  Your caregiver can usually tell what the problem is by doing a physical exam. TREATMENT   Cortisone (steroid) ointments, creams, and lotions can help decrease inflammation.  Babies can be treated with baby oil to soften the scales, then they  may be washed with baby shampoo. If this does not help, a prescription topical steroid medicine may work.  Adults can use medicated shampoos.  Your caregiver may prescribe corticosteroid cream and shampoo containing an antifungal or yeast medicine (ketoconazole). Hydrocortisone or anti-yeast cream can be rubbed directly onto seborrheic dermatitis patches. Yeast does not cause seborrheic dermatitis, but it seems to add to the problem. In infants, seborrheic dermatitis is often worst during the first year of life. It tends to disappear on its own as the child grows. However, it may return during the teenage years. In adults and adolescents, seborrheic dermatitis tends to be a long-lasting condition that comes and goes over many years. HOME CARE INSTRUCTIONS   Use prescribed medicines as directed.  In infants, do not aggressively remove the scales or flakes on the scalp with a comb or by other means. This may lead to hair loss. SEEK MEDICAL CARE IF:   The problem does not improve from the medicated shampoos, lotions, or other medicines given by your caregiver.  You have any other questions or concerns. Document Released: 05/31/2005 Document Revised: 11/30/2011 Document Reviewed: 10/20/2009 [facility] Patient Information 2015 [place], [place]. This information is not intended to replace advice given to you by your health care provider. Make sure you discuss any questions you have with your health care provider.

## 2014-06-24 NOTE — Progress Notes (Signed)
Patient presents to clinic today c/o continued flakiness of skin of face.  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  Zinc shampoo (Head and Shoulders) encouraged and Rx sent to pharmacy with directions to be giving by aides at his ALF.  Endorses stinging when shampoo got into his eyes so he has stopped shampoo.  Denies change in symptoms, although states they have worsened some.  Symptoms are worse around nose.  Past Medical History  Diagnosis Date  . Near syncope   . Sinus bradycardia   . Diastolic murmur   . Hypertension   . Renal insufficiency   . History of tobacco abuse   . Syncope     Current Outpatient Prescriptions on File Prior to Visit  Medication Sig Dispense Refill  . atorvastatin (LIPITOR) 10 MG tablet TAKE 1 TABLET BY MOUTH ONCE DAILY. 30 tablet 3  . donepezil (ARICEPT) 10 MG tablet TAKE 1 TABLET BY MOUTH ONCE DAILY. 30 tablet 5  . losartan (COZAAR) 50 MG tablet Take 1 tablet (50 mg total) by mouth daily. 90 tablet 1  . sodium chloride (OCEAN) 0.65 % SOLN nasal spray Place 1 spray into both nostrils 2 (two) times daily as needed for congestion.     No current facility-administered medications on file prior to visit.    No Known Allergies  Family History  Problem Relation Age of Onset  . Heart disease Father     Deceased-90s  . Cancer Father   . Stroke Mother 75    Deceased  . Heart disease Mother   . Dementia Mother   . Healthy Brother     x1  . Hypertension Daughter     x1    History   Social History  . Marital Status: Single    Spouse Name: N/A    Number of Children: N/A  . Years of Education: N/A   Social History Main Topics  . Smoking status: Former Smoker -- 3.00 packs/day for 39 years    Types: Cigarettes    Quit date: 06/14/1996  . Smokeless tobacco: Never Used     Comment: use to smoke 3 packs per day for 6 years quit in 1993  . Alcohol Use: No  . Drug Use: No  . Sexual Activity: No   Other Topics Concern  . None    Social History Narrative   Review of Systems - See HPI.  All other ROS are negative.  BP 129/61 mmHg  Pulse 61  Temp(Src) 98.1 F (36.7 C) (Oral)  Resp 16  Ht 5' 8"  (1.727 m)  Wt 221 lb 6 oz (100.415 kg)  BMI 33.67 kg/m2  SpO2 99%  Physical Exam  Constitutional: He is oriented to person, place, and time and well-developed, well-nourished, and in no distress.  HENT:  Head: Normocephalic and atraumatic.  Right Ear: External ear normal.  Left Ear: External ear normal.  Nose: Nose normal.  Mouth/Throat: Oropharynx is clear and moist. No oropharyngeal exudate.  Eyes: Conjunctivae and EOM are normal. Pupils are equal, round, and reactive to light.  Cardiovascular: Normal rate, regular rhythm and normal heart sounds.   Pulmonary/Chest: Effort normal and breath sounds normal. No respiratory distress. He has no wheezes. He has no rales.  Neurological: He is alert and oriented to person, place, and time.  Skin: Skin is warm and dry.  Presence of scaly and greasy flaking skin of face, most prominent around the nares bilaterally, consistent with seborrheic dermatitis.  Psychiatric: Affect normal.  Vitals reviewed.  Recent Results (from the past 2160 hour(s))  Vitamin B12     Status: None   Collection Time: 04/09/14 12:03 PM  Result Value Ref Range   Vitamin B-12 328 211 - 911 pg/mL  CBC     Status: Abnormal   Collection Time: 05/31/14 11:41 AM  Result Value Ref Range   WBC 8.5 4.0 - 10.5 K/uL   RBC 4.44 4.22 - 5.81 Mil/uL   Platelets 183.0 150.0 - 400.0 K/uL   Hemoglobin 13.1 13.0 - 17.0 g/dL   HCT 38.6 (L) 39.0 - 52.0 %   MCV 87.1 78.0 - 100.0 fl   MCHC 33.8 30.0 - 36.0 g/dL   RDW 13.6 11.5 - 15.5 %  Comp Met (CMET)     Status: Abnormal   Collection Time: 05/31/14 11:41 AM  Result Value Ref Range   Sodium 136 135 - 145 mEq/L   Potassium 4.3 3.5 - 5.1 mEq/L   Chloride 104 96 - 112 mEq/L   CO2 26 19 - 32 mEq/L   Glucose, Bld 101 (H) 70 - 99 mg/dL   BUN 17 6 - 23 mg/dL    Creatinine, Ser 1.1 0.4 - 1.5 mg/dL   Total Bilirubin 0.9 0.2 - 1.2 mg/dL   Alkaline Phosphatase 98 39 - 117 U/L   AST 21 0 - 37 U/L   ALT 18 0 - 53 U/L   Total Protein 6.8 6.0 - 8.3 g/dL   Albumin 4.0 3.5 - 5.2 g/dL   Calcium 9.1 8.4 - 10.5 mg/dL   GFR 71.55 >60.00 mL/min  Hemoglobin A1c     Status: None   Collection Time: 05/31/14 11:41 AM  Result Value Ref Range   Hgb A1c MFr Bld 5.8 4.6 - 6.5 %    Comment: Glycemic Control Guidelines for People with Diabetes:Non Diabetic:  <6%Goal of Therapy: <7%Additional Action Suggested:  >8%   Lipid Profile     Status: Abnormal   Collection Time: 05/31/14 11:41 AM  Result Value Ref Range   Cholesterol 147 0 - 200 mg/dL    Comment: ATP III Classification       Desirable:  < 200 mg/dL               Borderline High:  200 - 239 mg/dL          High:  > = 240 mg/dL   Triglycerides 161.0 (H) 0.0 - 149.0 mg/dL    Comment: Normal:  <150 mg/dLBorderline High:  150 - 199 mg/dL   HDL 33.60 (L) >39.00 mg/dL   VLDL 32.2 0.0 - 40.0 mg/dL   LDL Cholesterol 81 0 - 99 mg/dL   Total CHOL/HDL Ratio 4     Comment:                Men          Women1/2 Average Risk     3.4          3.3Average Risk          5.0          4.42X Average Risk          9.6          7.13X Average Risk          15.0          11.0                       NonHDL 113.40  Comment: NOTE:  Non-HDL goal should be 30 mg/dL higher than patient's LDL goal (i.e. LDL goal of < 70 mg/dL, would have non-HDL goal of < 100 mg/dL)    Assessment/Plan: Seborrheic dermatitis Rx Desonide ointment to apply BID for 2 weeks.  Clean and dry area thoroughly. Resume use of Head and Shoulders to stubborn areas.  Follow-up as scheduled.

## 2014-06-24 NOTE — Progress Notes (Signed)
Pre visit review using our clinic review tool, if applicable. No additional management support is needed unless otherwise documented below in the visit note/SLS  

## 2014-06-24 NOTE — Assessment & Plan Note (Signed)
Rx Desonide ointment to apply BID for 2 weeks.  Clean and dry area thoroughly. Resume use of Head and Shoulders to stubborn areas.  Follow-up as scheduled.

## 2014-06-27 ENCOUNTER — Telehealth: Payer: Self-pay | Admitting: Physician Assistant

## 2014-06-27 NOTE — Telephone Encounter (Signed)
Please advise 

## 2014-06-27 NOTE — Telephone Encounter (Addendum)
Caller name:[facility] Relationship to patient: other  Can be reached: [phone] Pharmacy: [facility] - , [place] - [address] [phone] (Phone)     Reason for call: Insurance will not cover desonide (DESOWEN) 0.05 % cream in need of an alternate.

## 2014-06-28 MED ORDER — TRIAMCINOLONE ACETONIDE 0.1 % EX CREA: 1.0000 "application " | TOPICAL_CREAM | Freq: Two times a day (BID) | CUTANEOUS | Status: DC

## 2014-06-28 NOTE — Telephone Encounter (Signed)
Kenalog cream sent to pharmacy.  Apply as directed for 2 weeks.

## 2014-06-28 NOTE — Telephone Encounter (Signed)
[facility] at [phone].  Fax confirmation received.

## 2014-07-02 ENCOUNTER — Telehealth: Payer: Self-pay | Admitting: *Deleted

## 2014-07-02 NOTE — Telephone Encounter (Signed)
Received medical record request via fax from [facility] for the last 3 OV notes and most recent labs. Signed consent form from patient included. Requested records faxed to [phone]. JG//CMA

## 2014-07-09 ENCOUNTER — Encounter: Payer: Self-pay | Admitting: Neurology

## 2014-07-09 ENCOUNTER — Ambulatory Visit (INDEPENDENT_AMBULATORY_CARE_PROVIDER_SITE_OTHER): Payer: Medicare Other | Admitting: Neurology

## 2014-07-09 ENCOUNTER — Other Ambulatory Visit: Payer: Self-pay | Admitting: Physician Assistant

## 2014-07-09 VITALS — BP 138/70 | HR 68 | Resp 16 | Ht 67.75 in | Wt 223.0 lb

## 2014-07-09 DIAGNOSIS — I10 Essential (primary) hypertension: Secondary | ICD-10-CM

## 2014-07-09 DIAGNOSIS — F03B0 Unspecified dementia, moderate, without behavioral disturbance, psychotic disturbance, mood disturbance, and anxiety: Secondary | ICD-10-CM

## 2014-07-09 DIAGNOSIS — F0390 Unspecified dementia without behavioral disturbance: Secondary | ICD-10-CM

## 2014-07-09 DIAGNOSIS — E785 Hyperlipidemia, unspecified: Secondary | ICD-10-CM

## 2014-07-09 MED ORDER — DONEPEZIL HCL 10 MG PO TABS: 10.0000 mg | ORAL_TABLET | Freq: Every day | ORAL | Status: AC

## 2014-07-09 NOTE — Telephone Encounter (Signed)
Rx request to pharmacy/[facility]  

## 2014-07-09 NOTE — Progress Notes (Signed)
NEUROLOGY FOLLOW UP OFFICE NOTE  Cody Vincent [id number]  HISTORY OF PRESENT ILLNESS: I had the pleasure of seeing Cody Vincent in follow-up in the neurology clinic on 07/09/2014.  The patient was last seen 3 months ago for worsening memory. He is accompanied by SNF staff who help supplement the history. His MMSE was 18/30 on his initial visit. Records and images were personally reviewed where available.  I personally reviewed head CT without contrast which showed generalized atrophy, progressed since 2012, no acute changes.  He started Aricept, which he is tolerating without side effects. SNF staff reports his memory is better. No behavioral changes. He denies any headaches, dizziness, diplopia, dysarthria, dysphagia, neck/back pain, focal numbness/tingling/weakness, bowel/bladder dysfunction. No falls.   HPI: This is a pleasant 74 yo RH man with a history of hypertension, hyperlipidemia, sinus bradycardia s/p pacemaker placement, with worsening memory loss. Patient is a poor historian. Records from PCP were reviewed, it was noted that nursing home attendant had concerns over the patient's memory, with note of increasing difficulty with short-term memory since first meeting patient several months ago. Daughter has note relaying similar concerns. According to the patient, he has been living in the SNF for less than a year. Prior to that, he had been living in a motel by himself for a year, and [place] [facility] His daughter took his car, he denied getting lost driving. Staff reports that he has difficulties with dates, and would say that his daughter did not come to visit when she in fact did. Staff feels he is doing better with his new roommate. He reports stopping school at age 74 (10th grade).  PAST MEDICAL HISTORY: Past Medical History  Diagnosis Date  . Near syncope   . Sinus bradycardia   . Diastolic murmur   . Hypertension   . Renal insufficiency   .  History of tobacco abuse   . Syncope     MEDICATIONS: Current Outpatient Prescriptions on File Prior to Visit  Medication Sig Dispense Refill  . atorvastatin (LIPITOR) 10 MG tablet TAKE 1 TABLET BY MOUTH ONCE DAILY. 30 tablet 3  . donepezil (ARICEPT) 10 MG tablet TAKE 1 TABLET BY MOUTH ONCE DAILY. 30 tablet 5  . losartan (COZAAR) 50 MG tablet Take 1 tablet (50 mg total) by mouth daily. 90 tablet 1  . sodium chloride (OCEAN) 0.65 % SOLN nasal spray Place 1 spray into both nostrils 2 (two) times daily as needed for congestion.    . triamcinolone cream (KENALOG) 0.1 % Apply 1 application topically 2 (two) times daily. 30 g 0   No current facility-administered medications on file prior to visit.    ALLERGIES: No Known Allergies  FAMILY HISTORY: Family History  Problem Relation Age of Onset  . Heart disease Father     Deceased-90s  . Cancer Father   . Stroke Mother [id number]    Deceased  . Heart disease Mother   . Dementia Mother   . Healthy Brother     x1  . Hypertension Daughter     x1    SOCIAL HISTORY: History   Social History  . Marital Status: Single    Spouse Name: N/A    Number of Children: N/A  . Years of Education: N/A   Occupational History  . Not on file.   Social History Main Topics  . Smoking status: Former Smoker -- 3.00 packs/day for 39 years    Types: Cigarettes  Quit date: 06/14/1996  . Smokeless tobacco: Never Used     Comment: use to smoke 3 packs per day for 6 years quit in 1993  . Alcohol Use: No  . Drug Use: No  . Sexual Activity: No   Other Topics Concern  . Not on file   Social History Narrative    REVIEW OF SYSTEMS: Constitutional: No fevers, chills, or sweats, no generalized fatigue, change in appetite Eyes: No visual changes, double vision, eye pain Ear, nose and throat: No hearing loss, ear pain, nasal congestion, sore throat Cardiovascular: No chest pain, palpitations Respiratory:  No shortness of breath at rest or with exertion,  wheezes GastrointestinaI: No nausea, vomiting, diarrhea, abdominal pain, fecal incontinence Genitourinary:  No dysuria, urinary retention or frequency Musculoskeletal:  No neck pain, back pain Integumentary: No rash, pruritus, skin lesions Neurological: as above Psychiatric: No depression, insomnia, anxiety Endocrine: No palpitations, fatigue, diaphoresis, mood swings, change in appetite, change in weight, increased thirst Hematologic/Lymphatic:  No anemia, purpura, petechiae. Allergic/Immunologic: no itchy/runny eyes, nasal congestion, recent allergic reactions, rashes  PHYSICAL EXAM: Filed Vitals:   07/09/14 1252  BP: 138/70  Pulse: 68  Resp: 16   General: No acute distress Head:  Normocephalic/atraumatic Neck: supple, no paraspinal tenderness, full range of motion Heart:  Regular rate and rhythm Lungs:  Clear to auscultation bilaterally Back: No paraspinal tenderness Skin/Extremities: No rash, no edema Neurological Exam: alert and oriented to person, place. Did not know year, stated date was Jan 25 or 26. Did not know president. No aphasia or dysarthria. Fund of knowledge is appropriate.  Recent and remote memory are intact. Did well with 3/3 delayed recall. Attention and concentration are normal.    Able to name objects and repeat phrases. Cranial nerves: Pupils equal, round, reactive to light.  Fundoscopic exam unremarkable, no papilledema. Extraocular movements intact with no nystagmus. Visual fields full. Facial sensation intact. No facial asymmetry. Tongue, uvula, palate midline.  Motor: Bulk and tone normal, muscle strength 5/5 throughout with no pronator drift.  Sensation to light touch intact.  No extinction to double simultaneous stimulation.  Deep tendon reflexes 1+ throughout, toes downgoing.  Finger to nose testing intact.  Gait narrow-based and steady, able to tandem walk adequately.  Romberg negative.  IMPRESSION: This is a 74 yo RH man with a history of hypertension,  hyperlipidemia, sinus bradycardia s/p pacemaker placement, with worsening memory. MMSE in October 2015 was 18/30, indicating moderate dementia. Head CT no acute changes, showing generalized atrophy. He is tolerating Aricept /day, continue current medications. He did well with exam today, able to name 3/3 objects with delayed recall testing. Continue with physical exercise and brain stimulation exercises for brain health, continue control of vascular risk factors. He will follow-up in 6 months.   Thank you for allowing me to participate in his care.  Please do not hesitate to call for any questions or concerns.  The duration of this appointment visit was 15 minutes of face-to-face time with the patient.  Greater than 50% of this time was spent in counseling, explanation of diagnosis, planning of further management, and coordination of care.   Patrcia Dolly, M.D.   CC: Dr. Tawni Levy

## 2014-07-09 NOTE — Patient Instructions (Signed)
1. Continue Aricept 10mg daily 2. Physical exercise and brain stimulation exercises are important for brain health 3. Follow-up in 6 months 

## 2014-07-15 DIAGNOSIS — F0390 Unspecified dementia without behavioral disturbance: Secondary | ICD-10-CM | POA: Insufficient documentation

## 2014-07-15 DIAGNOSIS — F03B0 Unspecified dementia, moderate, without behavioral disturbance, psychotic disturbance, mood disturbance, and anxiety: Secondary | ICD-10-CM | POA: Insufficient documentation

## 2014-07-15 NOTE — Progress Notes (Signed)
Done

## 2014-07-26 ENCOUNTER — Observation Stay (HOSPITAL_COMMUNITY)
Admission: EM | Admit: 2014-07-26 | Discharge: 2014-07-27 | Disposition: A | Discharge status: Skilled Nursing Facility | Payer: Medicare Other | Attending: Internal Medicine | Admitting: Internal Medicine

## 2014-07-26 ENCOUNTER — Emergency Department (HOSPITAL_COMMUNITY): Payer: Medicare Other

## 2014-07-26 ENCOUNTER — Encounter (HOSPITAL_COMMUNITY): Payer: Self-pay | Admitting: Emergency Medicine

## 2014-07-26 DIAGNOSIS — Z87891 Personal history of nicotine dependence: Secondary | ICD-10-CM | POA: Insufficient documentation

## 2014-07-26 DIAGNOSIS — I129 Hypertensive chronic kidney disease with stage 1 through stage 4 chronic kidney disease, or unspecified chronic kidney disease: Secondary | ICD-10-CM | POA: Diagnosis not present

## 2014-07-26 DIAGNOSIS — L219 Seborrheic dermatitis, unspecified: Secondary | ICD-10-CM | POA: Diagnosis not present

## 2014-07-26 DIAGNOSIS — N390 Urinary tract infection, site not specified: Secondary | ICD-10-CM | POA: Diagnosis not present

## 2014-07-26 DIAGNOSIS — F0390 Unspecified dementia without behavioral disturbance: Secondary | ICD-10-CM | POA: Diagnosis not present

## 2014-07-26 DIAGNOSIS — Z950 Presence of cardiac pacemaker: Secondary | ICD-10-CM | POA: Insufficient documentation

## 2014-07-26 DIAGNOSIS — I10 Essential (primary) hypertension: Secondary | ICD-10-CM | POA: Diagnosis present

## 2014-07-26 DIAGNOSIS — R0600 Dyspnea, unspecified: Secondary | ICD-10-CM | POA: Diagnosis present

## 2014-07-26 DIAGNOSIS — E039 Hypothyroidism, unspecified: Secondary | ICD-10-CM | POA: Diagnosis present

## 2014-07-26 DIAGNOSIS — I5189 Other ill-defined heart diseases: Secondary | ICD-10-CM | POA: Diagnosis present

## 2014-07-26 DIAGNOSIS — E785 Hyperlipidemia, unspecified: Secondary | ICD-10-CM | POA: Diagnosis not present

## 2014-07-26 DIAGNOSIS — I351 Nonrheumatic aortic (valve) insufficiency: Secondary | ICD-10-CM | POA: Diagnosis present

## 2014-07-26 DIAGNOSIS — R079 Chest pain, unspecified: Secondary | ICD-10-CM | POA: Diagnosis present

## 2014-07-26 DIAGNOSIS — K219 Gastro-esophageal reflux disease without esophagitis: Secondary | ICD-10-CM | POA: Diagnosis not present

## 2014-07-26 DIAGNOSIS — F03B0 Unspecified dementia, moderate, without behavioral disturbance, psychotic disturbance, mood disturbance, and anxiety: Secondary | ICD-10-CM | POA: Diagnosis present

## 2014-07-26 DIAGNOSIS — N182 Chronic kidney disease, stage 2 (mild): Secondary | ICD-10-CM | POA: Insufficient documentation

## 2014-07-26 HISTORY — DX: Seborrheic dermatitis, unspecified: L21.9

## 2014-07-26 HISTORY — DX: Unspecified dementia, unspecified severity, without behavioral disturbance, psychotic disturbance, mood disturbance, and anxiety: F03.90

## 2014-07-26 HISTORY — DX: Hyperlipidemia, unspecified: E78.5

## 2014-07-26 LAB — HEPATIC FUNCTION PANEL
ALT: 20 U/L (ref 0–53)
AST: 24 U/L (ref 0–37)
Albumin: 3.6 g/dL (ref 3.5–5.2)
Alkaline Phosphatase: 99 U/L (ref 39–117)
Bilirubin, Direct: 0.2 mg/dL (ref 0.0–0.5)
Indirect Bilirubin: 0.6 mg/dL (ref 0.3–0.9)
TOTAL PROTEIN: 6.4 g/dL (ref 6.0–8.3)
Total Bilirubin: 0.8 mg/dL (ref 0.3–1.2)

## 2014-07-26 LAB — URINALYSIS, ROUTINE W REFLEX MICROSCOPIC
Bilirubin Urine: NEGATIVE
GLUCOSE, UA: NEGATIVE mg/dL
Hgb urine dipstick: NEGATIVE
Ketones, ur: NEGATIVE mg/dL
NITRITE: NEGATIVE
PH: 7 (ref 5.0–8.0)
PROTEIN: NEGATIVE mg/dL
SPECIFIC GRAVITY, URINE: 1.011 (ref 1.005–1.030)
Urobilinogen, UA: 0.2 mg/dL (ref 0.0–1.0)

## 2014-07-26 LAB — BASIC METABOLIC PANEL
Anion gap: 10 (ref 5–15)
BUN: 13 mg/dL (ref 6–23)
CO2: 21 mmol/L (ref 19–32)
CREATININE: 1.07 mg/dL (ref 0.50–1.35)
Calcium: 9.2 mg/dL (ref 8.4–10.5)
Chloride: 108 mmol/L (ref 96–112)
GFR calc Af Amer: 79 mL/min — ABNORMAL LOW (ref >90)
GFR, EST NON AFRICAN AMERICAN: 68 mL/min — AB (ref >90)
Glucose, Bld: 105 mg/dL — ABNORMAL HIGH (ref 70–99)
Potassium: 4.1 mmol/L (ref 3.5–5.1)
Sodium: 139 mmol/L (ref 135–145)

## 2014-07-26 LAB — APTT: aPTT: 27 seconds (ref 24–37)

## 2014-07-26 LAB — PROTIME-INR
INR: 1.11 (ref 0.00–1.49)
PROTHROMBIN TIME: 14.4 s (ref 11.6–15.2)

## 2014-07-26 LAB — CBC
HCT: 38.3 % — ABNORMAL LOW (ref 39.0–52.0)
HEMOGLOBIN: 13.1 g/dL (ref 13.0–17.0)
MCH: 30.2 pg (ref 26.0–34.0)
MCHC: 34.2 g/dL (ref 30.0–36.0)
MCV: 88.2 fL (ref 78.0–100.0)
Platelets: 159 10*3/uL (ref 150–400)
RBC: 4.34 MIL/uL (ref 4.22–5.81)
RDW: 13.2 % (ref 11.5–15.5)
WBC: 6.6 10*3/uL (ref 4.0–10.5)

## 2014-07-26 LAB — URINE MICROSCOPIC-ADD ON

## 2014-07-26 LAB — TSH: TSH: 3.305 u[IU]/mL (ref 0.350–4.500)

## 2014-07-26 LAB — BRAIN NATRIURETIC PEPTIDE: B Natriuretic Peptide: 18.3 pg/mL (ref 0.0–100.0)

## 2014-07-26 LAB — TROPONIN I
Troponin I: <0.03 ng/mL (ref <0.031)
Troponin I: <0.03 ng/mL (ref <0.031)

## 2014-07-26 LAB — PHOSPHORUS: PHOSPHORUS: 2.8 mg/dL (ref 2.3–4.6)

## 2014-07-26 LAB — MAGNESIUM: Magnesium: 1.9 mg/dL (ref 1.5–2.5)

## 2014-07-26 LAB — LACTIC ACID, PLASMA: Lactic Acid, Venous: 1.1 mmol/L (ref 0.5–2.0)

## 2014-07-26 MED ORDER — ONDANSETRON HCL 4 MG/2ML IJ SOLN: 4.0000 mg | Freq: Four times a day (QID) | INTRAMUSCULAR | Status: DC | PRN

## 2014-07-26 MED ORDER — ONDANSETRON HCL 4 MG PO TABS: 4.0000 mg | ORAL_TABLET | Freq: Four times a day (QID) | ORAL | Status: DC | PRN

## 2014-07-26 MED ORDER — KETOCONAZOLE 2 % EX CREA
TOPICAL_CREAM | Freq: Two times a day (BID) | CUTANEOUS | Status: DC
  Administered 2014-07-26 – 2014-07-27 (×2): via TOPICAL
  Filled 2014-07-26: qty 15

## 2014-07-26 MED ORDER — DONEPEZIL HCL 10 MG PO TABS
10.0000 mg | ORAL_TABLET | Freq: Every day | ORAL | Status: DC
  Administered 2014-07-26 – 2014-07-27 (×2): 10 mg via ORAL
  Filled 2014-07-26 (×2): qty 1

## 2014-07-26 MED ORDER — LEVOTHYROXINE SODIUM 50 MCG PO TABS
50.0000 ug | ORAL_TABLET | Freq: Every day | ORAL | Status: DC
  Administered 2014-07-27: 50 ug via ORAL
  Filled 2014-07-26 (×2): qty 1

## 2014-07-26 MED ORDER — ATORVASTATIN CALCIUM 10 MG PO TABS
10.0000 mg | ORAL_TABLET | Freq: Every day | ORAL | Status: DC
  Administered 2014-07-26 – 2014-07-27 (×2): 10 mg via ORAL
  Filled 2014-07-26 (×2): qty 1

## 2014-07-26 MED ORDER — SALINE SPRAY 0.65 % NA SOLN
1.0000 | Freq: Two times a day (BID) | NASAL | Status: DC | PRN
  Filled 2014-07-26: qty 44

## 2014-07-26 MED ORDER — HEPARIN SODIUM (PORCINE) 5000 UNIT/ML IJ SOLN
5000.0000 [IU] | Freq: Three times a day (TID) | INTRAMUSCULAR | Status: DC
  Administered 2014-07-26 – 2014-07-27 (×3): 5000 [IU] via SUBCUTANEOUS
  Filled 2014-07-26 (×5): qty 1

## 2014-07-26 MED ORDER — SODIUM CHLORIDE 0.9 % IV SOLN: INTRAVENOUS | Status: DC

## 2014-07-26 MED ORDER — SODIUM CHLORIDE 0.9 % IV SOLN: INTRAVENOUS | Status: AC

## 2014-07-26 MED ORDER — HYDROCORTISONE 1 % EX CREA
TOPICAL_CREAM | Freq: Two times a day (BID) | CUTANEOUS | Status: DC
  Administered 2014-07-26 – 2014-07-27 (×2): via TOPICAL
  Filled 2014-07-26: qty 28

## 2014-07-26 MED ORDER — ACETAMINOPHEN 325 MG PO TABS: 650.0000 mg | ORAL_TABLET | Freq: Four times a day (QID) | ORAL | Status: DC | PRN

## 2014-07-26 MED ORDER — PANTOPRAZOLE SODIUM 40 MG PO TBEC
40.0000 mg | DELAYED_RELEASE_TABLET | Freq: Every day | ORAL | Status: DC
  Administered 2014-07-26 – 2014-07-27 (×2): 40 mg via ORAL
  Filled 2014-07-26 (×2): qty 1

## 2014-07-26 MED ORDER — SODIUM CHLORIDE 0.9 % IV SOLN
INTRAVENOUS | Status: AC
  Administered 2014-07-26: 19:00:00 via INTRAVENOUS

## 2014-07-26 MED ORDER — ACETAMINOPHEN 650 MG RE SUPP: 650.0000 mg | Freq: Four times a day (QID) | RECTAL | Status: DC | PRN

## 2014-07-26 MED ORDER — SODIUM CHLORIDE 0.9 % IJ SOLN
3.0000 mL | Freq: Two times a day (BID) | INTRAMUSCULAR | Status: DC
  Administered 2014-07-27: 3 mL via INTRAVENOUS

## 2014-07-26 NOTE — ED Notes (Signed)
Attempted report x1. 

## 2014-07-26 NOTE — ED Notes (Addendum)
[facility] sob that started this morning, pt also reports left sided cp only tender to palpation noted by EMS. EMS reports pt anxious upon arrival and en route pt became more calm and sob decreased per pt report. Denies any pain at this time. NAD noted. Pt noted to have rash to bilateral face reports began this morning after waking up. EKG unremarkable-paced rhythm.

## 2014-07-26 NOTE — H&P (Signed)
Date: 07/26/2014               Patient Name:  Cody Vincent MRN: [id number]  DOB: [birth year] Age / Sex: 74 y.o., male   PCP: Koren Bound, NP              Medical Service: Internal Medicine Teaching Service              Attending Physician: Dr. Burns Spain, MD    First Contact: Dr. Glenard Haring Pager: [phone]  Second Contact: Dr. Johna Roles Pager: [phone]            After Hours (After 5p/  First Contact Pager: [phone]  weekends / holidays): Second Contact Pager: [phone]   Chief Complaint:  Shortness of breath.  History of Present Illness: Cody Vincent is a 74 year old man with history of dementia, hypertension, hyperlipidemia, tobacco abuse, syncope, sinus bradycardia status post pacemaker placement, hypothyroidism, GERD, and seborrheic dermatitis presenting with shortness of breath. He does not fully recall the incident earlier today.  He remembers waking up and noticing worsening of the rash on his face.  He walked to the nurse station at his nursing home to get some cream for his face.  He says that it was a long walk, and on the way back he felt tired and short of breath, and he had to grab onto the railing in the hallway..  He went to his room and sat on his bed. EMS was called and noted left-sided chest pain only to palpation. He denies losing consciousness or hitting his head. Per EMS, the patient became more calm and his shortness of breath decreased and reach the hospital.  Upon arrival to the ER, he reported feeling better. He denied any flushing, palpitations, cough, fever, chills, nausea, or vomiting. He does report that a viral bug has been going around the nursing home, and he had some nausea and vomiting 3 weeks ago that has resolved. He denies applying any new creams or products to his face. He says he's had a similar rash in the past. He denies any throat swelling or narrowing. He says that he's having some lower abdominal discomfort, but he needs use the  restroom.  In the ER, his vital signs were stable, and troponin and EKG were unremarkable.  Review of Systems: Review of Systems  Constitutional: Positive for malaise/fatigue. Negative for fever and chills.  HENT: Negative for congestion and sore throat.   Eyes: Negative for blurred vision.  Respiratory: Negative for cough and shortness of breath.   Cardiovascular: Negative for chest pain, palpitations and leg swelling.  Gastrointestinal: Negative for nausea, vomiting, diarrhea, constipation, blood in stool and melena.  Genitourinary: Negative for dysuria, urgency and frequency.  Musculoskeletal: Negative for myalgias, back pain and joint pain.  Skin: Positive for rash (On face.).  Neurological: Negative for dizziness, sensory change, focal weakness, loss of consciousness, weakness and headaches.    Meds: Medications Prior to Admission  Medication Sig Dispense Refill  . atorvastatin (LIPITOR) 10 MG tablet TAKE 1 TABLET BY MOUTH ONCE DAILY. 30 tablet 3  . donepezil (ARICEPT) 10 MG tablet Take 1 tablet (10 mg total) by mouth daily. 90 tablet 3  . ergocalciferol (VITAMIN D2) 50000 UNITS capsule Take 50,000 Units by mouth once a week.    . levothyroxine (SYNTHROID, LEVOTHROID) 50 MCG tablet Take 50 mcg by mouth daily before breakfast.    . losartan (COZAAR) 50 MG tablet Take 1 tablet (50 mg total) by mouth  daily. 90 tablet 1  . omeprazole (PRILOSEC) 40 MG capsule 40 mg. Take 1 tablet daily    . sodium chloride (OCEAN) 0.65 % SOLN nasal spray Place 1 spray into both nostrils 2 (two) times daily as needed for congestion.    . triamcinolone cream (KENALOG) 0.1 % Apply 1 application topically 2 (two) times daily. 30 g 0   No current facility-administered medications for this encounter.    Allergies: Allergies as of 07/26/2014  . (No Known Allergies)   Past Medical History  Diagnosis Date  . Near syncope   . Sinus bradycardia   . Diastolic murmur   . Hypertension   . Renal  insufficiency   . History of tobacco abuse   . Syncope   . Dementia   . Seborrheic dermatitis   . Hyperlipidemia    Past Surgical History  Procedure Laterality Date  . Pacemaker insertion  03/19/13    SJM pacemaker implanted for symptomatic sinus bradycardia by Dr Johney Frame  . Kidney stone surgery  06/2013  . Multiple tooth extractions      Full Dentures  . Permanent pacemaker insertion N/A 03/19/2013    Procedure: PERMANENT PACEMAKER INSERTION;  Surgeon: Gardiner Rhyme, MD;  Location: [facility] CATH LAB;  Service: Cardiovascular;  Laterality: N/A;   Family History  Problem Relation Age of Onset  . Heart disease Father     Deceased-90s  . Cancer Father   . Stroke Mother 61    Deceased  . Heart disease Mother   . Dementia Mother   . Healthy Brother     x1  . Hypertension Daughter     x1   History   Social History  . Marital Status: Single    Spouse Name: N/A  . Number of Children: N/A  . Years of Education: N/A   Occupational History  . Not on file.   Social History Main Topics  . Smoking status: Former Smoker -- 3.00 packs/day for 39 years    Types: Cigarettes    Quit date: 06/14/1996  . Smokeless tobacco: Never Used     Comment: use to smoke 3 packs per day for 6 years quit in 1993  . Alcohol Use: No  . Drug Use: No  . Sexual Activity: No   Other Topics Concern  . Not on file   Social History Narrative    Physical Exam: Filed Vitals:   07/26/14 1815  BP: 131/63  Pulse: 63  Temp: 97.6 F (36.4 C)  Resp: 17   Physical Exam  Constitutional: He is oriented to person, place, and time and well-developed, well-nourished, and in no distress. No distress.  HENT:  Head: Normocephalic and atraumatic.  Eyes: Conjunctivae and EOM are normal. Pupils are equal, round, and reactive to light. No scleral icterus.  Neck: Normal range of motion. Neck supple.  Cardiovascular: Normal rate, regular rhythm and normal heart sounds.   Pulmonary/Chest: Effort normal and breath  sounds normal. No respiratory distress. He has no wheezes. He has no rales. He exhibits no tenderness.  Pacemaker in place at left chest. Nontender to palpation.  Abdominal: Soft. Bowel sounds are normal. He exhibits no distension. There is no tenderness.  Musculoskeletal: Normal range of motion. He exhibits no edema or tenderness.  Neurological: He is alert and oriented to person, place, and time. No cranial nerve deficit. He exhibits normal muscle tone.  Skin: Skin is warm and dry. He is not diaphoretic.  Erythematous, greasy-appearing papules and plaques above eyebrows and  paranasal extending beneath the eyes bilaterally. Papules on right side and chin with excoriation marks. Similar rash on mid chest.    Lab results: Basic Metabolic Panel:  Recent Labs  40/98/1101/05/29 1315  NA 139  K 4.1  CL 108  CO2 21  GLUCOSE 105*  BUN 13  CREATININE 1.07  CALCIUM 9.2   CBC:  Recent Labs  07/26/14 1315  WBC 6.6  HGB 13.1  HCT 38.3*  MCV 88.2  PLT 159   Cardiac Enzymes:  Recent Labs  07/26/14 1315  TROPONINI <0.03   Imaging results:  Dg Chest 2 View  07/26/2014   CLINICAL DATA:  Shortness of breath and weakness  EXAM: CHEST  2 VIEW  COMPARISON:  03/20/2013  FINDINGS: No cardiomegaly. Mild aortic tortuosity stable. Dual-chamber pacer leads from the left are in stable position. There is no edema, consolidation, effusion, or pneumothorax. No acute osseous findings.  IMPRESSION: Stable exam.  No acute cardiopulmonary disease.   Electronically Signed   By: Marnee SpringJonathon  Watts M.D.   On: 07/26/2014 14:20    Other results: EKG: Atrial paced rhythm, no change from prior.  Assessment & Plan by Problem: Active Problems:   Chest pain   #Shortness of breath Etiology is unclear, but it is currently resolved. He does have a pacemaker in place for sinus bradycardia, which we will interrogate. He denies chest pain, and per EMS notes he only had chest pain with palpation of his chest about this  pacemaker. This is now resolved. Echocardiogram from 03/29/2013 showed normal EF with grade 1 diastolic dysfunction and severe aortic regurgitation, but no other signs of heart failure and EKG unremarkable with normal BNP. Could also be due to anxiety. He reports a new rash this morning, but he does have a history of seborrheic dermatitis. He denies any swelling of his throat or other signs of anaphylaxis. We'll rule out ACS. -Admit to telemetry. -Trend troponins and EKG. -Called [employer] rep to interrogate pacemaker. -Check orthostatics. -Repeat echocardiogram tomorrow.  #Seborrheic dermatitis He carries a diagnosis of seborrheic dermatitis, and this does appear to be the etiology of his rash with some excoriations. Less likely to be an allergic reaction. -Ketoconazole 2% cream and hydrocortisone 1% cream.  #Abdominal discomfort Likely related to need to use restroom. He denies any urinary symptoms. -Check urinalysis. -Follow-up tomorrow.  #Hypothyroidism Last TSH normal in April 2015. -Continue home Synthroid 50 g daily. -Check TSH.  #GERD -Continue home Prilosec 40 mg daily  #Hypertension Normotensive in the ER. -Hold home Cozaar 50 mg daily and monitor blood pressure.  #Hyperlipidemia -Continue home Lipitor.  #Dementia He follows with Dr. Karel JarvisAquino of neurology, and was recently started on Aricept for memory loss. -Continue home Aricept.  Dispo: Disposition is deferred at this time, awaiting improvement of current medical problems. Anticipated discharge in approximately 1-2 day(s).   The patient does have a current PCP Koren Bound(Andrew Matrone, NP), therefore will not be require [facility] follow-up after discharge.   The patient does have transportation limitations that hinder transportation to clinic appointments.   Signed:  Luisa DagoEverett Swanson Farnell, MD, PhD PGY-1 Internal Medicine Teaching Service Pager: [phone] 07/26/2014, 6:35 PM

## 2014-07-26 NOTE — ED Notes (Signed)
Charge RN notified that MD requesting pacemaker interrogation.  Spoke with Collene SchlichterJana Millwood from [employer] who states our box will work on his pacemaker.

## 2014-07-26 NOTE — ED Provider Notes (Signed)
CSN: [id number]     Arrival date & time 07/26/14  1300 History   First MD Initiated Contact with Patient 07/26/14 1320     Chief Complaint  Patient presents with  . Shortness of Breath      Patient is a 74 y.o. male presenting with shortness of breath. The history is provided by the EMS personnel, the nursing home and the patient. The history is limited by the condition of the patient (Hx dementia).  Shortness of Breath Pt was seen at 1320. Per EMS, NH report and pt: c/o gradual onset and resolution of one episode of chest "pain" that occurred today PTA. Pt states he was walking in the hallway at the NH and "had to grab onto the siderail in the hall" because he felt "SOB" and had "chest pain." Pt states he feels "better" since sitting down and being transported to the ED. Denies palpitations, no cough, no back pain, no abd pain, no N/V/D. Pt has significant hx of dementia.    Past Medical History  Diagnosis Date  . Near syncope   . Sinus bradycardia   . Diastolic murmur   . Hypertension   . Renal insufficiency   . History of tobacco abuse   . Syncope   . Dementia   . Seborrheic dermatitis   . Hyperlipidemia    Past Surgical History  Procedure Laterality Date  . Pacemaker insertion  03/19/13    SJM pacemaker implanted for symptomatic sinus bradycardia by Dr Johney Frame  . Kidney stone surgery  06/2013  . Multiple tooth extractions      Full Dentures  . Permanent pacemaker insertion N/A 03/19/2013    Procedure: PERMANENT PACEMAKER INSERTION;  Surgeon: Gardiner Rhyme, MD;  Location: [facility] CATH [facility];  Service: Cardiovascular;  Laterality: N/A;   Family History  Problem Relation Age of Onset  . Heart disease Father     Deceased-90s  . Cancer Father   . Stroke Mother 42    Deceased  . Heart disease Mother   . Dementia Mother   . Healthy Brother     x1  . Hypertension Daughter     x1   History  Substance Use Topics  . Smoking status: Former Smoker -- 3.00 packs/day for 39 years   Types: Cigarettes    Quit date: 06/14/1996  . Smokeless tobacco: Never Used     Comment: use to smoke 3 packs per day for 6 years quit in 1993  . Alcohol Use: No    Review of Systems  Unable to perform ROS: Dementia  Respiratory: Positive for shortness of breath.       Allergies  Review of patient's allergies indicates no known allergies.  Home Medications   Prior to Admission medications   Medication Sig Start Date End Date Taking? Authorizing Provider  atorvastatin (LIPITOR) 10 MG tablet TAKE 1 TABLET BY MOUTH ONCE DAILY. 11/26/13   Waldon Merl, PA-C  donepezil (ARICEPT) 10 MG tablet Take 1 tablet (10 mg total) by mouth daily. 07/09/14   Van Clines, MD  losartan (COZAAR) 50 MG tablet Take 1 tablet (50 mg total) by mouth daily. 05/31/14   Waldon Merl, PA-C  omeprazole (PRILOSEC) 40 MG capsule 40 mg. Take 1 tablet daily 07/04/14   Historical Provider, MD  sodium chloride (OCEAN) 0.65 % SOLN nasal spray Place 1 spray into both nostrils 2 (two) times daily as needed for congestion.    Historical Provider, MD  triamcinolone cream (KENALOG) 0.1 %  Apply 1 application topically 2 (two) times daily. 06/28/14   Waldon Merl, PA-C   BP 132/62 mmHg  Pulse 60  Temp(Src) 97.6 F (36.4 C) (Oral)  Resp 16  Ht  (1.727 m)  Wt 223 lb (101.152 kg)  BMI 33.91 kg/m2  SpO2 100% Physical Exam  1325; Physical examination:  Nursing notes reviewed; Vital signs and O2 SAT reviewed;  Constitutional: Well developed, Well nourished, Well hydrated, In no acute distress; Head:  Normocephalic, atraumatic. +dry skin with excoriations to right face.; Eyes: EOMI, PERRL, No scleral icterus; ENMT: Mouth and pharynx normal, Mucous membranes moist; Neck: Supple, Full range of motion, No lymphadenopathy; Cardiovascular: Regular rate and rhythm, No gallop; Respiratory: Breath sounds clear & equal bilaterally, No wheezes.  Speaking full sentences with ease, Normal respiratory effort/excursion; Chest:  Nontender, Movement normal; Abdomen: Soft, Nontender, Nondistended, Normal bowel sounds; Genitourinary: No CVA tenderness; Extremities: Pulses normal, No tenderness, No edema, No calf edema or asymmetry.; Neuro: Awake, alert, confused re: time, place, events per hx. Major CN grossly intact. No facial droop. Speech clear. Moves all extremities spontaneously and to command without apparent gross focal motor deficits.; Skin: Color normal, Warm, Dry.   ED Course  Procedures     EKG Interpretation   Date/Time:  Friday July 26 2014 13:07:35 EST Ventricular Rate:  62 PR Interval:    QRS Duration: 98 QT Interval:  436 QTC Calculation: 443 R Axis:   -10 Text Interpretation:  Junctional rhythm RSR' in V1 or V2, right VCD or RVH  Baseline wander When compared with ECG of 09/08/2013 No significant change  was found Confirmed by [facility]  MD, Nicholos Johns [phone]) on 07/26/2014 1:17:21  PM      MDM  MDM Reviewed: previous chart, nursing note and vitals Reviewed previous: labs and ECG Interpretation: labs, ECG and x-ray     Results for orders placed or performed during the hospital encounter of 07/26/14  Basic metabolic panel  Result Value Ref Range   Sodium 139 135 - 145 mmol/L   Potassium 4.1 3.5 - 5.1 mmol/L   Chloride 108 96 - 112 mmol/L   CO2 21 19 - 32 mmol/L   Glucose, Bld 105 (H) 70 - 99 mg/dL   BUN 13 6 - 23 mg/dL   Creatinine, Ser 6.04 0.50 - 1.35 mg/dL   Calcium 9.2 8.4 - 54.0 mg/dL   GFR calc non Af Amer 68 (L) >90 mL/min   GFR calc Af Amer 79 (L) >90 mL/min   Anion gap 10 5 - 15  BNP (order ONLY if patient complains of dyspnea/SOB AND you have documented it for THIS visit)  Result Value Ref Range   B Natriuretic Peptide 18.3 0.0 - 100.0 pg/mL  CBC  Result Value Ref Range   WBC 6.6 4.0 - 10.5 K/uL   RBC 4.34 4.22 - 5.81 MIL/uL   Hemoglobin 13.1 13.0 - 17.0 g/dL   HCT 98.1 (L) 19.1 - 47.8 %   MCV 88.2 78.0 - 100.0 fL   MCH 30.2 26.0 - 34.0 pg   MCHC 34.2 30.0 - 36.0  g/dL   RDW 29.5 62.1 - 30.8 %   Platelets 159 150 - 400 K/uL  Troponin I  Result Value Ref Range   Troponin I <0.03 <0.031 ng/mL   Dg Chest 2 View 07/26/2014   CLINICAL DATA:  Shortness of breath and weakness  EXAM: CHEST  2 VIEW  COMPARISON:  03/20/2013  FINDINGS: No cardiomegaly. Mild aortic tortuosity stable. Dual-chamber  pacer leads from the left are in stable position. There is no edema, consolidation, effusion, or pneumothorax. No acute osseous findings.  IMPRESSION: Stable exam.  No acute cardiopulmonary disease.   Electronically Signed   By: Marnee SpringJonathon  Watts M.D.   On: 07/26/2014 14:20    1515:   Pt continues to deny CP/SOB while in the ED. Multiple risk factors for ACS, will observation admit. [facility], case discussed, including:  HPI, pertinent PM/SHx, VS/PE, dx testing, ED course and treatment:  Agreeable to admit, requests to write temporary orders, obtain observation tele bed to Dr. Donnelly StagerButcher's service.   Samuel JesterKathleen Chanetta Moosman, DO 07/27/14 1540

## 2014-07-27 DIAGNOSIS — F0390 Unspecified dementia without behavioral disturbance: Secondary | ICD-10-CM

## 2014-07-27 DIAGNOSIS — R0600 Dyspnea, unspecified: Secondary | ICD-10-CM

## 2014-07-27 DIAGNOSIS — L219 Seborrheic dermatitis, unspecified: Secondary | ICD-10-CM

## 2014-07-27 DIAGNOSIS — E039 Hypothyroidism, unspecified: Secondary | ICD-10-CM

## 2014-07-27 DIAGNOSIS — B9689 Other specified bacterial agents as the cause of diseases classified elsewhere: Secondary | ICD-10-CM

## 2014-07-27 DIAGNOSIS — N390 Urinary tract infection, site not specified: Secondary | ICD-10-CM

## 2014-07-27 LAB — BASIC METABOLIC PANEL
ANION GAP: 5 (ref 5–15)
BUN: 18 mg/dL (ref 6–23)
CHLORIDE: 110 mmol/L (ref 96–112)
CO2: 26 mmol/L (ref 19–32)
CREATININE: 1.04 mg/dL (ref 0.50–1.35)
Calcium: 8.5 mg/dL (ref 8.4–10.5)
GFR calc Af Amer: 81 mL/min — ABNORMAL LOW (ref >90)
GFR calc non Af Amer: 70 mL/min — ABNORMAL LOW (ref >90)
GLUCOSE: 103 mg/dL — AB (ref 70–99)
Potassium: 4.3 mmol/L (ref 3.5–5.1)
Sodium: 141 mmol/L (ref 135–145)

## 2014-07-27 LAB — TROPONIN I: Troponin I: <0.03 ng/mL (ref <0.031)

## 2014-07-27 LAB — MRSA PCR SCREENING: MRSA BY PCR: POSITIVE — AB

## 2014-07-27 MED ORDER — SULFAMETHOXAZOLE-TRIMETHOPRIM 800-160 MG PO TABS: 1.0000 | ORAL_TABLET | Freq: Two times a day (BID) | ORAL | Status: DC

## 2014-07-27 MED ORDER — MUPIROCIN 2 % EX OINT
1.0000 "application " | TOPICAL_OINTMENT | Freq: Two times a day (BID) | CUTANEOUS | Status: DC
  Administered 2014-07-27: 1 via NASAL
  Filled 2014-07-27 (×2): qty 22

## 2014-07-27 MED ORDER — SULFAMETHOXAZOLE-TRIMETHOPRIM 800-160 MG PO TABS
1.0000 | ORAL_TABLET | Freq: Two times a day (BID) | ORAL | Status: DC
  Administered 2014-07-27: 1 via ORAL
  Filled 2014-07-27 (×2): qty 1

## 2014-07-27 MED ORDER — SULFAMETHOXAZOLE-TRIMETHOPRIM 800-160 MG PO TABS: 1.0000 | ORAL_TABLET | Freq: Two times a day (BID) | ORAL | Status: AC

## 2014-07-27 MED ORDER — KETOCONAZOLE 2 % EX CREA: TOPICAL_CREAM | Freq: Two times a day (BID) | CUTANEOUS | Status: DC

## 2014-07-27 MED ORDER — HYDROCORTISONE 1 % EX CREA: TOPICAL_CREAM | Freq: Two times a day (BID) | CUTANEOUS | Status: AC

## 2014-07-27 MED ORDER — CHLORHEXIDINE GLUCONATE CLOTH 2 % EX PADS
6.0000 | MEDICATED_PAD | Freq: Every day | CUTANEOUS | Status: DC
  Administered 2014-07-27: 6 via TOPICAL

## 2014-07-27 NOTE — Clinical Social Work Note (Signed)
Clinical Social Worker facilitated patient discharge including contacting patient family and facility to confirm patient discharge plans.  Clinical information faxed to facility and family agreeable with plan.  CSW [facility] .  RN to call report prior to discharge.  DC packet on chart for transport.   Clinical Social Worker will sign off for now as social work intervention is no longer needed. Please consult us again if new need arises.  Derenda FennelBashira Alfred Harrel, MSW, LCSWA [phone] 07/27/2014 4:51 PM

## 2014-07-27 NOTE — Progress Notes (Signed)
PT Cancellation Note  Patient Details Name: Cody Vincent MRN: [id number] DOB: April 23, 1941   Cancelled Treatment:    Reason Eval/Treat Not Completed: Patient not medically ready Patient remains on strict bed rest orders. Will follow up when patient is medically ready for PT evaluation.  Berton MountBarbour, Haelee Bolen S 07/27/2014, 7:57 AM Charlsie MerlesLogan Secor Kella Splinter, PT [phone]

## 2014-07-27 NOTE — Progress Notes (Signed)
  Date: 07/27/2014  Patient name: Cody Vincent  Medical record number: [id number]  Date of birth: [birth year]   I have seen and evaluated Cody Vincent and discussed their care with the Residency Team. Mr Cody Vincent was admitted for dyspnea on exertion which occurred walking back from nurses station at his nursing home to his room. He did not have LOC, CP, nor palps. EMS was called and pt was transported to the ED.   NAD, lying in bed. Conversant. Pleasant. HRRR. LCTAB. ABD + BS.   Assessment and Plan: I have seen and evaluated the patient as outlined above. I agree with the formulated Assessment and Plan as detailed in the residents' admission note, with the following changes:   1. Acute dyspnea - this has completely resolved. No sig etiology has been discovered. Vitals and exam nl. His CXR is nl. Labs nl. Trop and EKG nl. Well's score is 0. He has an ECHO pending although doubt sig HF as BNP is 18. No further W/U is needed.  2. UTI - he does have urinary freq and a UA c/w UTI. Therefore, agree with ABX course.   3. Hypothyroidism - TSH is nl. Cont current dose.   Once ECHO complete, he may be D'C;d to his nursing facility.  Burns SpainElizabeth A Cyara Devoto, MD 2/13/201611:05 AM

## 2014-07-27 NOTE — Progress Notes (Signed)
"  NIGHT FLOAT ON CALL INTERN,"  AHMED,  NOTIFIED OF PATIENT'S POSITIVE MRSA SWAB.

## 2014-07-27 NOTE — Clinical Social Work Psychosocial (Signed)
Clinical Education officer, museum met with patient at bedside in reference to post-acute placement/ pt's return to ALF, [facility]. CSW explained CSW role and discharge plans. Pt stated he has been a resident of [facility] for about a year now. Patient noticeably confused during assessment however stated he is ready to return to [facility]. Patient gave CSW permission to contact pt's dtr, Lattie Haw. CSW spoke with pt's dtr at length in regards to pt's discharge plans. Pt's dtr reported pt's wife is actively passing and is currently at [facility]. Pt's dtr reported she is agreeable with discharge plans and would like CSW to arrange transportation back to [facility]. Pt's dtr also requested that RN assist pt with placing a phone call to his wife(pt's wife requesting to speak with him). CSW notified RN who will assist pt with request.   CSW contacted [facility] and spoke with [facility] who requested clinical information to be faxed. Clinical info faxed and CSW currently awaiting a returned phone call from facility. (CSW contacted facility for a third time... Facility asked several questions in reference to med list and requesting to speak with RN. RN notified with contact information.)  FL-2 signed and DC packet prepared for [facility] transportation.   Pt alert and slightly disoriented. Pt appears to be flat and looks away during assessment often. Pt and pt's family pleasant and appreciated social work intervention. Pt and pt's family agreeable to ALF/[facility] return.   CSW will follow to facilitate pt's discharge needs.   Glendon Axe, MSW, LCSWA [phone] 07/27/2014 4:36 PM

## 2014-07-27 NOTE — Progress Notes (Signed)
UR completed 

## 2014-07-27 NOTE — Discharge Summary (Signed)
Name: Cody Vincent MRN: [id number] DOB: 29-Jul-1940 74 y.o. PCP: Cody Bound, NP  Date of Admission: 07/26/2014  1:00 PM Date of Discharge: 07/27/2014 Attending Physician: Burns Spain, MD  Discharge Diagnosis:  Principal Problem:   Dyspnea Active Problems:   Aortic regurgitation   Essential hypertension   S/P placement of cardiac pacemaker   Hyperlipidemia   Seborrheic dermatitis   Moderate dementia   Chest pain   Hypothyroidism   Diastolic dysfunction   CKD (chronic kidney disease), stage II  Discharge Medications:   Medication List    STOP taking these medications        triamcinolone cream 0.1 %  Commonly known as:  KENALOG      TAKE these medications        atorvastatin 10 MG tablet  Commonly known as:  LIPITOR  TAKE 1 TABLET BY MOUTH ONCE DAILY.     donepezil 10 MG tablet  Commonly known as:  ARICEPT  Take 1 tablet (10 mg total) by mouth daily.     ergocalciferol 50000 UNITS capsule  Commonly known as:  VITAMIN D2  Take 50,000 Units by mouth once a week.     hydrocortisone cream 1 %  Apply topically 2 (two) times daily.     ketoconazole 2 % cream  Commonly known as:  NIZORAL  Apply topically 2 (two) times daily.     levothyroxine 50 MCG tablet  Commonly known as:  SYNTHROID, LEVOTHROID  Take 50 mcg by mouth daily before breakfast.     losartan 50 MG tablet  Commonly known as:  COZAAR  Take 1 tablet (50 mg total) by mouth daily.     omeprazole 40 MG capsule  Commonly known as:  PRILOSEC  40 mg. Take 1 tablet daily     sodium chloride 0.65 % Soln nasal spray  Commonly known as:  OCEAN  Place 1 spray into both nostrils 2 (two) times daily as needed for congestion.     sulfamethoxazole-trimethoprim 800-160 MG per tablet  Commonly known as:  BACTRIM DS,SEPTRA DS  Take 1 tablet by mouth every 12 (twelve) hours.        Disposition and follow-up:   Mr.Dagmawi L Amorin was discharged from [facility] in Stable  condition.  At the hospital follow up visit please address:  1.  Ensure follow up with cardiology, resolution of shortness of breath, improvement of seborrheic dermatitis, completion of UTI treatment (finishes on 08/02/2014).  2.  Labs / imaging needed at time of follow-up: Consider repeating echocardiogram.  3.  Pending labs/ test needing follow-up: HIV antibody, urine culture.  Follow-up Appointments:     Follow-up Information    Follow up with Cody Bound, NP. Call in 1 week.   Specialty:  Nurse Practitioner   Contact information:   [address] [place] [address] [phone]       Follow up with Cody Range, MD. Call in 1 week.   Specialty:  Cardiology   Contact information:   [address] [place] [address] [phone]       Discharge Instructions:  Thank you for allowing [place] to be involved in your healthcare while you were hospitalized at [facility].   Please note that there have been changes to your home medications.  --> PLEASE LOOK AT YOUR DISCHARGE MEDICATION LIST FOR DETAILS.   Please call your PCP if you have any questions or concerns, or any difficulty getting any  of your medications.  Please return to the ER if you have worsening of your symptoms or new severe symptoms arise.  Make sure to follow up with your cardiologist and PCP. Discharge Instructions    Call MD for:  difficulty breathing, headache or visual disturbances    Complete by:  As directed      Call MD for:  extreme fatigue    Complete by:  As directed      Call MD for:  persistant dizziness or light-headedness    Complete by:  As directed      Call MD for:  persistant nausea and vomiting    Complete by:  As directed      Call MD for:  redness, tenderness, or signs of infection (pain, swelling, redness, odor or green/yellow discharge around incision site)    Complete by:  As directed      Call MD for:  severe uncontrolled pain    Complete  by:  As directed      Call MD for:  temperature >100.4    Complete by:  As directed      Diet - low sodium heart healthy    Complete by:  As directed      Increase activity slowly    Complete by:  As directed            Consultations: None.  Procedures Performed:  Dg Chest 2 View  07/26/2014   CLINICAL DATA:  Shortness of breath and weakness  EXAM: CHEST  2 VIEW  COMPARISON:  03/20/2013  FINDINGS: No cardiomegaly. Mild aortic tortuosity stable. Dual-chamber pacer leads from the left are in stable position. There is no edema, consolidation, effusion, or pneumothorax. No acute osseous findings.  IMPRESSION: Stable exam.  No acute cardiopulmonary disease.   Electronically Signed   By: Marnee SpringJonathon  Watts M.D.   On: 07/26/2014 14:20   Admission HPI:  Cody Vincent is a 74 year old man with history of dementia, hypertension, hyperlipidemia, tobacco abuse, syncope, sinus bradycardia status post pacemaker placement, hypothyroidism, GERD, and seborrheic dermatitis presenting with shortness of breath. He does not fully recall the incident earlier today. He remembers waking up and noticing worsening of the rash on his face. He walked to the nurse station at his nursing home to get some cream for his face. He says that it was a long walk, and on the way back he felt tired and short of breath, and he had to grab onto the railing in the hallway.. He went to his room and sat on his bed. EMS was called and noted left-sided chest pain only to palpation. He denies losing consciousness or hitting his head. Per EMS, the patient became more calm and his shortness of breath decreased and reach the hospital. Upon arrival to the ER, he reported feeling better. He denied any flushing, palpitations, cough, fever, chills, nausea, or vomiting. He does report that a viral bug has been going around the nursing home, and he had some nausea and vomiting 3 weeks ago that has resolved. He denies applying any new creams or  products to his face. He says he's had a similar rash in the past. He denies any throat swelling or narrowing. He says that he's having some lower abdominal discomfort, but he needs use the restroom.  In the ER, his vital signs were stable, and troponin and EKG were unremarkable.  Hospital Course by problem list: Principal Problem:   Dyspnea Active Problems:   Aortic regurgitation  Essential hypertension   S/P placement of cardiac pacemaker   Hyperlipidemia   Seborrheic dermatitis   Moderate dementia   Chest pain   Hypothyroidism   Diastolic dysfunction   CKD (chronic kidney disease), stage II   #Dyspnea He presented with dyspnea on exertion, but he denied any chest pain, syncope, or palpitations. His symptoms had resolved by the time of presentation. His BNP was not elevated, and he had a previous echocardiogram in October 2014, which showed severe aortic regurgitation but normal EF and grade 1 diastolic dysfunction. He also has a pacemaker in place for sinus bradycardia, which was interrogated and was unremarkable. His labs and chest x-ray were unremarkable, and EKG and troponins were trended and were negative. The etiology of his shortness of breath was unclear. He was asked to follow-up with his primary care doctor and cardiologist following discharge to his nursing home. Repeat echocardiogram could be considered as an outpatient.  #Urinary tract infection He complained of some lower abdominal pain while in the ER. Urinalysis showed 11-20 white blood cells with rare bacteria. On repeat questioning, he did endorse urinary frequency, so he was treated for a complicated UTI with Bactrim DS twice a day for 7 days.  #Seborrheic dermatitis He was seen by his doctor with a rash on his nose and face last month and was prescribed triamcinolone cream to treat seborrheic dermatitis. He reported recurrence of the rash on the morning of presentation. He was started on ketoconazole 2% cream and  hydrocortisone 1% cream with improvement of his rash. The creams were continued on discharge.  #Hypothyroidism His TSH was checked and was normal. He was continued on his home Synthroid 50 g daily.  #Dementia He follows with Dr. Karel Jarvis of neurology, and was recently started on Aricept for memory loss, which was continued during the hospitalization.  Discharge Vitals:   BP 110/57 mmHg  Pulse 60  Temp(Src) 97.7 F (36.5 C) (Axillary)  Resp 18  Ht 5' 7.5" (1.715 m)  Wt 218 lb 11.1 oz (99.2 kg)  BMI 33.73 kg/m2  SpO2 96%  Discharge Labs:  Results for orders placed or performed during the hospital encounter of 07/26/14 (from the past 24 hour(s))  Troponin I     Status: None   Collection Time: 07/26/14  8:02 PM  Result Value Ref Vincent   Troponin I <0.03 <0.031 ng/mL  Hepatic function panel     Status: None   Collection Time: 07/26/14  8:02 PM  Result Value Ref Vincent   Total Protein 6.4 6.0 - 8.3 g/dL   Albumin 3.6 3.5 - 5.2 g/dL   AST 24 0 - 37 U/L   ALT 20 0 - 53 U/L   Alkaline Phosphatase 99 39 - 117 U/L   Total Bilirubin 0.8 0.3 - 1.2 mg/dL   Bilirubin, Direct 0.2 0.0 - 0.5 mg/dL   Indirect Bilirubin 0.6 0.3 - 0.9 mg/dL  Magnesium     Status: None   Collection Time: 07/26/14  8:02 PM  Result Value Ref Vincent   Magnesium 1.9 1.5 - 2.5 mg/dL  Phosphorus     Status: None   Collection Time: 07/26/14  8:02 PM  Result Value Ref Vincent   Phosphorus 2.8 2.3 - 4.6 mg/dL  TSH     Status: None   Collection Time: 07/26/14  8:02 PM  Result Value Ref Vincent   TSH 3.305 0.350 - 4.500 uIU/mL  Lactic acid, plasma     Status: None   Collection Time: 07/26/14  8:02 PM  Result Value Ref Vincent   Lactic Acid, Venous 1.1 0.5 - 2.0 mmol/L  Protime-INR     Status: None   Collection Time: 07/26/14  8:02 PM  Result Value Ref Vincent   Prothrombin Time 14.4 11.6 - 15.2 seconds   INR 1.11 0.00 - 1.49  APTT     Status: None   Collection Time: 07/26/14  8:02 PM  Result Value Ref Vincent   aPTT 27  24 - 37 seconds  Urinalysis, Routine w reflex microscopic     Status: Abnormal   Collection Time: 07/26/14  8:59 PM  Result Value Ref Vincent   Color, Urine YELLOW YELLOW   APPearance CLEAR CLEAR   Specific Gravity, Urine 1.011 1.005 - 1.030   pH 7.0 5.0 - 8.0   Glucose, UA NEGATIVE NEGATIVE mg/dL   Hgb urine dipstick NEGATIVE NEGATIVE   Bilirubin Urine NEGATIVE NEGATIVE   Ketones, ur NEGATIVE NEGATIVE mg/dL   Protein, ur NEGATIVE NEGATIVE mg/dL   Urobilinogen, UA 0.2 0.0 - 1.0 mg/dL   Nitrite NEGATIVE NEGATIVE   Leukocytes, UA MODERATE (A) NEGATIVE  Urine microscopic-add on     Status: None   Collection Time: 07/26/14  8:59 PM  Result Value Ref Vincent   WBC, UA 11-20 <3 WBC/hpf   RBC / HPF 0-2 <3 RBC/hpf   Bacteria, UA RARE RARE  Troponin I     Status: None   Collection Time: 07/27/14 12:52 AM  Result Value Ref Vincent   Troponin I <0.03 <0.031 ng/mL  MRSA PCR Screening     Status: Abnormal   Collection Time: 07/27/14  2:35 AM  Result Value Ref Vincent   MRSA by PCR POSITIVE (A) NEGATIVE  Basic metabolic panel     Status: Abnormal   Collection Time: 07/27/14  6:45 AM  Result Value Ref Vincent   Sodium 141 135 - 145 mmol/L   Potassium 4.3 3.5 - 5.1 mmol/L   Chloride 110 96 - 112 mmol/L   CO2 26 19 - 32 mmol/L   Glucose, Bld 103 (H) 70 - 99 mg/dL   BUN 18 6 - 23 mg/dL   Creatinine, Ser 1.61 0.50 - 1.35 mg/dL   Calcium 8.5 8.4 - 09.6 mg/dL   GFR calc non Af Amer 70 (L) >90 mL/min   GFR calc Af Amer 81 (L) >90 mL/min   Anion gap 5 5 - 15    Signed: Luisa Dago, MD 07/27/2014, 2:31 PM    Services Ordered on Discharge: SNF. Equipment Ordered on Discharge: None.

## 2014-07-27 NOTE — Progress Notes (Signed)
Subjective:    Currently, the patient reports doing well this morning with no complaints. He denies any shortness of breath, fatigue, or chest pain. He continues to have some lower abdominal pain, and he does say that he has been urinating more frequently than normal recently. He says his rash is improved from yesterday.  Interval Events: -Troponins negative overnight. -EKG unchanged from yesterday this morning.   Objective:    Vital Signs:   Temp:  [97.6 F (36.4 C)-98.2 F (36.8 C)] 97.7 F (36.5 C) (02/13 0553) Pulse Rate:  [59-66] 60 (02/13 0553) Resp:  [16-22] 18 (02/13 0553) BP: (108-144)/(43-83) 110/57 mmHg (02/13 0553) SpO2:  [96 %-100 %] 96 % (02/13 0553) Weight:  [218 lb 11.1 oz (99.2 kg)] 218 lb 11.1 oz (99.2 kg) (02/13 0553) Last BM Date: 07/26/14  24-hour weight change: Weight change:   Intake/Output:   Intake/Output Summary (Last 24 hours) at 07/27/14 1403 Last data filed at 07/27/14 1203  Gross per 24 hour  Intake   1080 ml  Output   1300 ml  Net   -220 ml      Physical Exam: General: Vital signs reviewed and noted. Well-developed, well-nourished, in no acute distress; alert, appropriate and cooperative throughout examination.  Lungs:  Normal respiratory effort. Clear to auscultation BL without crackles or wheezes.  Heart: RRR. S1 and S2 normal without gallop, murmur, or rubs.  Abdomen:  BS normoactive. Soft, Nondistended, non-tender.  No masses or organomegaly.  Extremities: No pretibial edema.  Rash: Improved from yesterday.     Labs:  Basic Metabolic Panel:  Recent Labs Lab 07/26/14 1315 07/26/14 2002 07/27/14 0645  NA 139  --  141  K 4.1  --  4.3  CL 108  --  110  CO2 21  --  26  GLUCOSE 105*  --  103*  BUN 13  --  18  CREATININE 1.07  --  1.04  CALCIUM 9.2  --  8.5  MG  --  1.9  --   PHOS  --  2.8  --     Liver Function Tests:  Recent Labs Lab 07/26/14 2002  AST 24  ALT 20  ALKPHOS 99  BILITOT 0.8  PROT 6.4  ALBUMIN  3.6   CBC:  Recent Labs Lab 07/26/14 1315  WBC 6.6  HGB 13.1  HCT 38.3*  MCV 88.2  PLT 159    Cardiac Enzymes:  Recent Labs Lab 07/26/14 1315 07/26/14 2002 07/27/14 0052  TROPONINI <0.03 <0.03 <0.03   Microbiology: Results for orders placed or performed during the hospital encounter of 07/26/14  MRSA PCR Screening     Status: Abnormal   Collection Time: 07/27/14  2:35 AM  Result Value Ref Range Status   MRSA by PCR POSITIVE (A) NEGATIVE Final    Comment:        The GeneXpert MRSA Assay (FDA approved for NASAL specimens only), is one component of a comprehensive MRSA colonization surveillance program. It is not intended to diagnose MRSA infection nor to guide or monitor treatment for MRSA infections. RESULT CALLED TO, READ BACK BY AND VERIFIED WITH: KELLAM,S RN [phone] AT 0408 SKEEN,P     Coagulation Studies:  Recent Labs  07/26/14 2002  LABPROT 14.4  INR 1.11     Other results: EKG: Atrial paced rhythm, no change from prior.  Imaging: Dg Chest 2 View  07/26/2014   CLINICAL DATA:  Shortness of breath and weakness  EXAM: CHEST  2 VIEW  COMPARISON:  03/20/2013  FINDINGS: No cardiomegaly. Mild aortic tortuosity stable. Dual-chamber pacer leads from the left are in stable position. There is no edema, consolidation, effusion, or pneumothorax. No acute osseous findings.  IMPRESSION: Stable exam.  No acute cardiopulmonary disease.   Electronically Signed   By: Marnee Spring M.D.   On: 07/26/2014 14:20       Medications:    Infusions:    Scheduled Medications: . atorvastatin  10 mg Oral Daily  . Chlorhexidine Gluconate Cloth  6 each Topical Q0600  . donepezil  10 mg Oral Daily  . heparin  5,000 Units Subcutaneous 3 times per day  . hydrocortisone cream   Topical BID  . ketoconazole   Topical BID  . levothyroxine  50 mcg Oral QAC breakfast  . mupirocin ointment  1 application Nasal BID  . pantoprazole  40 mg Oral Daily  . sodium chloride  3 mL  Intravenous Q12H  . sulfamethoxazole-trimethoprim  1 tablet Oral Q12H    PRN Medications: acetaminophen **OR** acetaminophen, ondansetron **OR** ondansetron (ZOFRAN) IV, sodium chloride   Assessment/ Plan:    Principal Problem:   Dyspnea Active Problems:   Aortic regurgitation   Essential hypertension   S/P placement of cardiac pacemaker   Hyperlipidemia   Seborrheic dermatitis   Moderate dementia   Chest pain   Hypothyroidism   Diastolic dysfunction   CKD (chronic kidney disease), stage II  #Shortness of breath Now resolved. Etiology unclear, but he denies any chest pain currently. EKGs and troponin unremarkable. BNP was unremarkable, so unlikely to be due to heart failure. Echocardiogram could not be arranged for today, so we will have him follow up with his cardiologist who can order a repeat echo. Labs including magnesium and phosphorus normal. Pacemaker has not been interrogated. -We'll have him follow-up with cardiology as an outpatient. -Consider outpatient echo. -We'll attempt to get pacemaker interrogated prior to discharge.  #UTI Urinalysis with 11-20 white blood cells and rare bacteria. He does endorse some abdominal pain and urinary frequency. -Follow-up urine culture. -We'll treat for complicated UTI with Bactrim DS twice a day for 7 days.  #Seborrheic dermatitis His rash is improved today with ketoconazole and hydrocortisone cream. -Continue ketoconazole 2% cream and hydrocortisone 1% cream on discharge.  #Hypothyroidism TSH normal. -Continue home Synthroid 50 g daily.  #GERD -Continue home Prilosec 40 mg daily.  #Hypertension -Continue home Cozaar 50 mg daily.  #Hyperlipidemia -Continue home Lipitor.  #Dementia -Continue home Aricept.   DVT PPX - heparin  CODE STATUS - Full.  CONSULTS PLACED - None.  DISPO - Discharge home this afternoon after pacemaker interrogation.   The patient does have a current PCP (MATRONE, ANDREW, NP) and does  not need an [facility] hospital follow-up appointment after discharge.    Is the [facility] hospital follow-up appointment a one-time only appointment? not applicable.  Does the patient have transportation limitations that hinder transportation to clinic appointments? yes   SERVICE NEEDED AT DISCHARGE - TO BE DETERMINED DURING HOSPITAL COURSE         Y = Yes, Blank = No PT:   OT:   RN:   Equipment:   Other: Return to nursing home.     Length of Stay: 1 day(s)   Signed: Luisa Dago, MD  PGY-1, Internal Medicine Resident Pager: [phone] (7AM-5PM) 07/27/2014, 2:03 PM

## 2014-07-27 NOTE — Evaluation (Signed)
Physical Therapy Evaluation and Discharge Patient Details Name: Cody Vincent MRN: [id number] DOB: 1941/02/28 Today's Date: 07/27/2014   History of Present Illness  74 y.o. male with history of dementia admitted with shortness of breath, UTI, and Seborrheic dermatitis.  Clinical Impression  Patient evaluated by Physical Therapy with no further acute PT needs identified. All education has been completed and the patient has no further questions. Ambulates generally well up to 200 feet today, tolerating challenging dynamic gait tasks without loss of balance. He states that he still feels moderately weak compared to his baseline and therefore recommend outpatient PT follow up for generalized weakness. See below for any follow-up Physial Therapy or equipment needs. PT is signing off. Thank you for this referral.     Follow Up Recommendations Outpatient PT    Equipment Recommendations  None recommended by PT    Recommendations for Other Services       Precautions / Restrictions Precautions Precautions: None Restrictions Weight Bearing Restrictions: No      Mobility  Bed Mobility Overal bed mobility: Independent                Transfers Overall transfer level: Modified independent Equipment used: None             General transfer comment: No physical assist needed.  Ambulation/Gait Ambulation/Gait assistance: Supervision Ambulation Distance (Feet): 200 Feet Assistive device: None Gait Pattern/deviations: Step-through pattern     General Gait Details: Ambulates generally well up to 200 feet today. Tolerated dynamic gait balance challenges including high marching, backwards stepping, and variable speeds, all without loss of balance or need for physical assist to correct.  Stairs            Wheelchair Mobility    Modified Rankin (Stroke Patients Only)       Balance Overall balance assessment: No apparent balance deficits (not formally assessed)                                           Pertinent Vitals/Pain Pain Assessment: 0-10 Pain Score: 0-No pain    Home Living Family/patient expects to be discharged to:: Assisted living Living Arrangements:  (roommate in ALF room) Available Help at Discharge: Other (Comment) (assisted living facility) Type of Home: Assisted living       Home Layout: One level Home Equipment: Cane - single point      Prior Function Level of Independence: Independent with assistive device(s)         Comments: cane for ambulation. Bath/dress himself. States he ambulates a long distance to [employer].     Hand Dominance   Dominant Hand: Right    Extremity/Trunk Assessment   Upper Extremity Assessment: Defer to OT evaluation           Lower Extremity Assessment: Overall WFL for tasks assessed         Communication   Communication: No difficulties  Cognition Arousal/Alertness: Awake/alert Behavior During Therapy: WFL for tasks assessed/performed Overall Cognitive Status: History of cognitive impairments - at baseline                      General Comments General comments (skin integrity, edema, etc.): SpO2 98% on room air, HR 75 while ambulating.    Exercises        Assessment/Plan    PT Assessment All further PT needs can  be met in the next venue of care  PT Diagnosis Generalized weakness   PT Problem List Decreased strength;Decreased activity tolerance;Decreased mobility  PT Treatment Interventions     PT Goals (Current goals can be found in the Care Plan section) Acute Rehab PT Goals Patient Stated Goal: none stated    Frequency     Barriers to discharge        Co-evaluation               End of Session   Activity Tolerance: Patient tolerated treatment well Patient left: in bed;with call bell/phone within reach Nurse Communication: Mobility status    Functional Assessment Tool Used: clinical observation Functional  Limitation: Mobility: Walking and moving around Mobility: Walking and Moving Around Current Status (Z0092): At least 1 percent but less than 20 percent impaired, limited or restricted Mobility: Walking and Moving Around Goal Status [phone]): At least 1 percent but less than 20 percent impaired, limited or restricted Mobility: Walking and Moving Around Discharge Status [phone]): At least 1 percent but less than 20 percent impaired, limited or restricted    Time: 3354-5625 PT Time Calculation (min) (ACUTE ONLY): 20 min   Charges:   PT Evaluation $Initial PT Evaluation Tier I: 1 Procedure     PT G Codes:   PT G-Codes **NOT FOR INPATIENT CLASS** Functional Assessment Tool Used: clinical observation Functional Limitation: Mobility: Walking and moving around Mobility: Walking and Moving Around Current Status (W3893): At least 1 percent but less than 20 percent impaired, limited or restricted Mobility: Walking and Moving Around Goal Status [phone]): At least 1 percent but less than 20 percent impaired, limited or restricted Mobility: Walking and Moving Around Discharge Status [phone]): At least 1 percent but less than 20 percent impaired, limited or restricted    Ellouise Newer 07/27/2014, 4:57 PM Camille Bal [place], [place]

## 2014-07-27 NOTE — Progress Notes (Signed)
CRITICAL LAB RESULT:  POSITIVE MRSA SWAB   Patient placed on contact isolation per protocol.

## 2014-07-27 NOTE — Discharge Instructions (Addendum)
·   Thank you for allowing us to be involved in your healthcare while you were hospitalized at [facility].   Please note that there have been changes to your home medications.  --> PLEASE LOOK AT YOUR DISCHARGE MEDICATION LIST FOR DETAILS.   Please call your PCP if you have any questions or concerns, or any difficulty getting any of your medications.  Please return to the ER if you have worsening of your symptoms or new severe symptoms arise.  Make sure to follow up with your cardiologist and PCP.

## 2014-07-27 NOTE — Progress Notes (Signed)
PATIENT HAS RESTED INTERMITTENTLY THROUGHOUT SHIFT. NO C/O PAIN OR DISCOMFORT. PATIENT ORIENTED TO SELF AND PLACE. ABLE TO STATE MONTH AND DAY, BUT NOT YEAR. ALSO SEEMS FORGETFUL OF EVENTS LEADING UP TO ADMISSION. PATIENT FOLLOWS COMMANDS AND IS ABLE TO AMBULATE WITH STANDBY ASSISTANCE.  BED LOCKED AND IN LOWEST POSITIONS; CALL BELL WITHIN REACH. PATIENT REMINDED TO CALL FOR ASSISTANCE WHEN NEEDED.

## 2014-07-28 LAB — URINE CULTURE
Colony Count: 50000
Special Requests: NORMAL

## 2014-07-28 NOTE — Progress Notes (Signed)
Patient discharged back to [facility] via [facility]. Paperwork given to Production managertransport personnel. Afleming, RN

## 2014-07-29 LAB — HIV ANTIBODY (ROUTINE TESTING W REFLEX): HIV Screen 4th Generation wRfx: NONREACTIVE

## 2014-08-09 ENCOUNTER — Emergency Department (HOSPITAL_COMMUNITY)
Admission: EM | Admit: 2014-08-09 | Discharge: 2014-08-09 | Disposition: A | Discharge status: Home / Self Care | Payer: Medicare Other | Attending: Emergency Medicine | Admitting: Emergency Medicine

## 2014-08-09 ENCOUNTER — Emergency Department (HOSPITAL_COMMUNITY): Payer: Medicare Other

## 2014-08-09 ENCOUNTER — Encounter (HOSPITAL_COMMUNITY): Payer: Self-pay | Admitting: Emergency Medicine

## 2014-08-09 DIAGNOSIS — Z79899 Other long term (current) drug therapy: Secondary | ICD-10-CM | POA: Diagnosis not present

## 2014-08-09 DIAGNOSIS — I10 Essential (primary) hypertension: Secondary | ICD-10-CM | POA: Diagnosis not present

## 2014-08-09 DIAGNOSIS — Z87891 Personal history of nicotine dependence: Secondary | ICD-10-CM | POA: Diagnosis not present

## 2014-08-09 DIAGNOSIS — Z87448 Personal history of other diseases of urinary system: Secondary | ICD-10-CM | POA: Insufficient documentation

## 2014-08-09 DIAGNOSIS — Z872 Personal history of diseases of the skin and subcutaneous tissue: Secondary | ICD-10-CM | POA: Diagnosis not present

## 2014-08-09 DIAGNOSIS — R42 Dizziness and giddiness: Secondary | ICD-10-CM | POA: Diagnosis not present

## 2014-08-09 DIAGNOSIS — R011 Cardiac murmur, unspecified: Secondary | ICD-10-CM | POA: Insufficient documentation

## 2014-08-09 DIAGNOSIS — E785 Hyperlipidemia, unspecified: Secondary | ICD-10-CM | POA: Diagnosis not present

## 2014-08-09 LAB — DIFFERENTIAL
BASOS ABS: 0 10*3/uL (ref 0.0–0.1)
BASOS PCT: 1 % (ref 0–1)
Eosinophils Absolute: 0.2 10*3/uL (ref 0.0–0.7)
Eosinophils Relative: 4 % (ref 0–5)
Lymphocytes Relative: 20 % (ref 12–46)
Lymphs Abs: 1.1 10*3/uL (ref 0.7–4.0)
MONO ABS: 0.8 10*3/uL (ref 0.1–1.0)
Monocytes Relative: 14 % — ABNORMAL HIGH (ref 3–12)
NEUTROS ABS: 3.4 10*3/uL (ref 1.7–7.7)
NEUTROS PCT: 61 % (ref 43–77)

## 2014-08-09 LAB — COMPREHENSIVE METABOLIC PANEL
ALT: 16 U/L (ref 0–53)
ANION GAP: 4 — AB (ref 5–15)
AST: 26 U/L (ref 0–37)
Albumin: 3.5 g/dL (ref 3.5–5.2)
Alkaline Phosphatase: 111 U/L (ref 39–117)
BUN: 11 mg/dL (ref 6–23)
CALCIUM: 9.1 mg/dL (ref 8.4–10.5)
CO2: 26 mmol/L (ref 19–32)
CREATININE: 1.21 mg/dL (ref 0.50–1.35)
Chloride: 109 mmol/L (ref 96–112)
GFR calc Af Amer: 68 mL/min — ABNORMAL LOW (ref >90)
GFR, EST NON AFRICAN AMERICAN: 58 mL/min — AB (ref >90)
Glucose, Bld: 88 mg/dL (ref 70–99)
POTASSIUM: 3.9 mmol/L (ref 3.5–5.1)
SODIUM: 139 mmol/L (ref 135–145)
Total Bilirubin: 0.6 mg/dL (ref 0.3–1.2)
Total Protein: 6.1 g/dL (ref 6.0–8.3)

## 2014-08-09 LAB — CBC
HCT: 35.7 % — ABNORMAL LOW (ref 39.0–52.0)
Hemoglobin: 12.1 g/dL — ABNORMAL LOW (ref 13.0–17.0)
MCH: 29.4 pg (ref 26.0–34.0)
MCHC: 33.9 g/dL (ref 30.0–36.0)
MCV: 86.7 fL (ref 78.0–100.0)
PLATELETS: 151 10*3/uL (ref 150–400)
RBC: 4.12 MIL/uL — ABNORMAL LOW (ref 4.22–5.81)
RDW: 13.3 % (ref 11.5–15.5)
WBC: 5.6 10*3/uL (ref 4.0–10.5)

## 2014-08-09 LAB — URINALYSIS, ROUTINE W REFLEX MICROSCOPIC
Bilirubin Urine: NEGATIVE
Glucose, UA: NEGATIVE mg/dL
Hgb urine dipstick: NEGATIVE
KETONES UR: NEGATIVE mg/dL
NITRITE: NEGATIVE
Protein, ur: NEGATIVE mg/dL
Specific Gravity, Urine: 1.01 (ref 1.005–1.030)
Urobilinogen, UA: 0.2 mg/dL (ref 0.0–1.0)
pH: 6.5 (ref 5.0–8.0)

## 2014-08-09 LAB — URINE MICROSCOPIC-ADD ON

## 2014-08-09 LAB — RAPID URINE DRUG SCREEN, HOSP PERFORMED
Amphetamines: NOT DETECTED
Barbiturates: NOT DETECTED
Benzodiazepines: NOT DETECTED
Cocaine: NOT DETECTED
Opiates: NOT DETECTED
TETRAHYDROCANNABINOL: NOT DETECTED

## 2014-08-09 LAB — APTT: aPTT: 32 seconds (ref 24–37)

## 2014-08-09 LAB — TROPONIN I: Troponin I: <0.03 ng/mL (ref <0.031)

## 2014-08-09 LAB — PROTIME-INR
INR: 1.13 (ref 0.00–1.49)
Prothrombin Time: 14.7 seconds (ref 11.6–15.2)

## 2014-08-09 LAB — ETHANOL: Alcohol, Ethyl (B): <5 mg/dL (ref 0–9)

## 2014-08-09 NOTE — ED Notes (Signed)
Called [facility] 

## 2014-08-09 NOTE — ED Provider Notes (Signed)
  Physical Exam  BP 131/60 mmHg  Pulse 60  Temp(Src) 97.8 F (36.6 C) (Oral)  Resp 23  Ht 5\' 7"  (1.702 m)  Wt 220 lb (99.791 kg)  BMI 34.45 kg/m2  SpO2 94%  Physical Exam  ED Course  Procedures  MDM Care assumed at sign out from Dr. Bebe ShaggyWickline. Patient here with dizziness. Pacemaker interrogated. Has some PMTs. The rep adjusted setting. I discussed with Dr. Jenel LucksAllred's PA, who states that he doesn't have arrhythmias and PMTs unlikely will contribute to his symptoms. Feels well now. Delta trop neg. Repeat EKG unchanged. Will dc back to facility.   Richardean Canalavid H Yao, MD 08/09/14 [phone]

## 2014-08-09 NOTE — ED Notes (Signed)
[facility] called for [facility].

## 2014-08-09 NOTE — ED Notes (Signed)
Spoke with supervisor [facility] she states pt came to her after lunch and stated he was having pain in his left shoulder, she gave him 500Mg  of tylenol. Then at 1300 pt stated he was very SOB and was dizzy and lightheaded. That is when she called EMS for further evaluation. Dr. Bebe ShaggyWickline made aware.

## 2014-08-09 NOTE — ED Provider Notes (Signed)
CSN: [id number]     Arrival date & time 08/09/14  1345 History   First MD Initiated Contact with Patient 08/09/14 1354     Chief Complaint  Patient presents with  . Dizziness  LEVEL 5 CAVEAT FOR DEMENTIA  Patient is a 74 y.o. male presenting with dizziness. The history is provided by the patient. The history is limited by the condition of the patient.  Dizziness Quality:  Lightheadedness Severity:  Moderate Onset quality:  Gradual Timing:  Intermittent Progression:  Improving Chronicity:  New Relieved by: rest. Exacerbated by: walking. PT LIVES IN NURSING FACILITY TODAY AND TOLD THE SUPERVISOR HE FELT DIZZY AND SHORT OF BREATH NO SYNCOPE NO CP REPORTED BUT HE HAD TOLD SUPERVISOR HE HAD LEFT SHOULDER PAIN THIS WAS AT APPROXIMATELY 1300   Past Medical History  Diagnosis Date  . Near syncope   . Sinus bradycardia   . Diastolic murmur   . Hypertension   . Renal insufficiency   . History of tobacco abuse   . Syncope   . Dementia   . Seborrheic dermatitis   . Hyperlipidemia    Past Surgical History  Procedure Laterality Date  . Pacemaker insertion  03/19/13    SJM pacemaker implanted for symptomatic sinus bradycardia by Dr Johney Frame  . Kidney stone surgery  06/2013  . Multiple tooth extractions      Full Dentures  . Permanent pacemaker insertion N/A 03/19/2013    Procedure: PERMANENT PACEMAKER INSERTION;  Surgeon: Gardiner Rhyme, MD;  Location: [facility] CATH LAB;  Service: Cardiovascular;  Laterality: N/A;   Family History  Problem Relation Age of Onset  . Heart disease Father     Deceased-90s  . Cancer Father   . Stroke Mother 61    Deceased  . Heart disease Mother   . Dementia Mother   . Healthy Brother     x1  . Hypertension Daughter     x1   History  Substance Use Topics  . Smoking status: Former Smoker -- 3.00 packs/day for 39 years    Types: Cigarettes    Quit date: 06/14/1996  . Smokeless tobacco: Never Used     Comment: use to smoke 3 packs per day for 6 years  quit in 1993  . Alcohol Use: No    Review of Systems  Unable to perform ROS: Dementia  Neurological: Positive for dizziness.      Allergies  Review of patient's allergies indicates no known allergies.  Home Medications   Prior to Admission medications   Medication Sig Start Date End Date Taking? Authorizing Provider  atorvastatin (LIPITOR) 10 MG tablet TAKE 1 TABLET BY MOUTH ONCE DAILY. 11/26/13   Waldon Merl, PA-C  donepezil (ARICEPT) 10 MG tablet Take 1 tablet (10 mg total) by mouth daily. 07/09/14   Van Clines, MD  ergocalciferol (VITAMIN D2) 50000 UNITS capsule Take 50,000 Units by mouth once a week.    Historical Provider, MD  hydrocortisone cream 1 % Apply topically 2 (two) times daily. 07/27/14   Luisa Dago, MD  ketoconazole (NIZORAL) 2 % cream Apply topically 2 (two) times daily. 07/27/14   Luisa Dago, MD  levothyroxine (SYNTHROID, LEVOTHROID) 50 MCG tablet Take 50 mcg by mouth daily before breakfast.    Historical Provider, MD  losartan (COZAAR) 50 MG tablet Take 1 tablet (50 mg total) by mouth daily. 05/31/14   Waldon Merl, PA-C  omeprazole (PRILOSEC) 40 MG capsule 40 mg. Take 1 tablet daily 07/04/14   Historical  Provider, MD  sodium chloride (OCEAN) 0.65 % SOLN nasal spray Place 1 spray into both nostrils 2 (two) times daily as needed for congestion.    Historical Provider, MD   BP 128/55 mmHg  Pulse 59  Temp(Src) 97.8 F (36.6 C) (Oral)  Resp 24  Ht  (1.702 m)  Wt 220 lb (99.791 kg)  BMI 34.45 kg/m2  SpO2 95% Physical Exam CONSTITUTIONAL: Well developed/well nourished HEAD: Normocephalic/atraumatic EYES: EOMI ENMT: Mucous membranes moist NECK: supple no meningeal signs SPINE/BACK:entire spine nontender CV: S1/S2 noted, no murmurs/rubs/gallops noted LUNGS: Lungs are clear to auscultation bilaterally, no apparent distress ABDOMEN: soft, nontender, no rebound or guarding, bowel sounds noted throughout abdomen NEURO: Pt is awake/alert,  moves all extremitiesx4.  No facial droop.   No arm leg drift. Pt is slow to respond. EXTREMITIES: pulses normal/equal, full ROM SKIN: warm, color normal PSYCH: no abnormalities of mood noted, alert and oriented to situation  ED Course  Procedures   3:35 PM Pt with h/o symptomatic bradycardia with pacemaker in place presenting with dizziness/near syncope earlier He feels improved He denies full LOC He is poor historian at baseline (h/o dementia) No signs of acute CVA and this appears more c/w near syncope rather than acute neurologic event Recently admitted for SOB/cardiac workup that was unrevealing Will need to have pacemaker evaluated At time of signout to dr Silverio Lay, f/u on interrogation and repeat ekg/troponin at 1700 If negative pt can be discharge back to retirement center Labs Review Labs Reviewed  CBC - Abnormal; Notable for the following:    RBC 4.12 (*)    Hemoglobin 12.1 (*)    HCT 35.7 (*)    All other components within normal limits  DIFFERENTIAL - Abnormal; Notable for the following:    Monocytes Relative 14 (*)    All other components within normal limits  COMPREHENSIVE METABOLIC PANEL - Abnormal; Notable for the following:    GFR calc non Af Amer 58 (*)    GFR calc Af Amer 68 (*)    Anion gap 4 (*)    All other components within normal limits  ETHANOL  PROTIME-INR  APTT  TROPONIN I  URINE RAPID DRUG SCREEN (HOSP PERFORMED)  URINALYSIS, ROUTINE W REFLEX MICROSCOPIC    Imaging Review Ct Head Wo Contrast  08/09/2014   CLINICAL DATA:  Dizziness and loss of balance  EXAM: CT HEAD WITHOUT CONTRAST  TECHNIQUE: Contiguous axial images were obtained from the base of the skull through the vertex without intravenous contrast.  COMPARISON:  April 24, 2014  FINDINGS: Mild diffuse atrophy is stable. Small stable frontal subdural hygromas remain without change. Mild mass effect on each frontal horn is stable. No midline shift. Similar changes in the cerebellum bilaterally  are stable with small subdural hygromas bilaterally in the cerebellar region causing mild mass effect but no midline shift. There is no intracranial mass, acute hemorrhage, or acute appearing infarct. There is minimal periventricular small vessel disease in the centra semiovale bilaterally. The bony calvarium appears intact. The mastoid air cells are clear on the left. There is opacification of several inferior mastoid air cells on the right. There is mild mucosal thickening in several ethmoid air cells bilaterally.  IMPRESSION: Opacification of several inferior right mastoid air cells. Question source of dizziness. There is also mild ethmoid sinus disease.  Mild atrophy with subdural hygromas in each frontal region and cerebellar region causing mild chronic mass effect but no midline shift. These are stable changes. No acute hemorrhage.  No intra-axial mass or acute infarct. Minimal periventricular small vessel disease.   Electronically Signed   By: Bretta BangWilliam  Woodruff III M.D.   On: 08/09/2014 15:02   Dg Chest Portable 1 View  08/09/2014   CLINICAL DATA:  Dizziness and shortness of breath for 2 hours  EXAM: PORTABLE CHEST - 1 VIEW  COMPARISON:  07/26/2014  FINDINGS: Cardiac shadow again demonstrates mild enlargement. A pacing device is again seen. The lungs are clear bilaterally. No bony abnormality is seen.  IMPRESSION: No acute abnormality noted.   Electronically Signed   By: Alcide CleverMark  Lukens M.D.   On: 08/09/2014 14:35     EKG Interpretation   Date/Time:  Friday August 09 2014 13:50:55 EST Ventricular Rate:  62 PR Interval:  209 QRS Duration: 113 QT Interval:  420 QTC Calculation: 426 R Axis:   -6 Text Interpretation:  Sinus rhythm Incomplete right bundle branch block ST  depression, consider ischemia, lateral lds No significant change since  last tracing Confirmed by Bebe ShaggyWICKLINE  MD, Dorinda HillNALD ([id number]) on 08/09/2014  2:35:37 PM      MDM  Nursing notes including past medical history and social  history reviewed and considered in documentation xrays/imaging reviewed by myself and considered during evaluation Labs/vital reviewed myself and considered during evaluation Previous records reviewed and considered    Final diagnoses:  None       Joya Gaskinsonald W Euna Armon, MD 08/09/14 1538

## 2014-08-09 NOTE — ED Notes (Addendum)
Pt was cleaning out some drawers at home and had a sudden onset of dizziness and shortness of breath around 1300. Denies and vision changes, no neuro defecits noted. Pt is alert and ox4.  Pt does have pacemaker. Pt c/o of pain in left shoulder, "hurts when they moved me in the truck"

## 2014-08-09 NOTE — Discharge Instructions (Signed)
Continue taking your current medicines.   Follow up with your cardiologist.   Return to ER if you have chest pain, dizziness, passing out.

## 2014-08-10 ENCOUNTER — Encounter (HOSPITAL_COMMUNITY): Payer: Self-pay

## 2014-08-10 ENCOUNTER — Emergency Department (HOSPITAL_COMMUNITY)
Admission: EM | Admit: 2014-08-10 | Discharge: 2014-08-10 | Disposition: A | Discharge status: Home / Self Care | Payer: Medicare Other | Attending: Emergency Medicine | Admitting: Emergency Medicine

## 2014-08-10 ENCOUNTER — Emergency Department (HOSPITAL_COMMUNITY): Payer: Medicare Other

## 2014-08-10 DIAGNOSIS — Z87891 Personal history of nicotine dependence: Secondary | ICD-10-CM | POA: Insufficient documentation

## 2014-08-10 DIAGNOSIS — F419 Anxiety disorder, unspecified: Secondary | ICD-10-CM

## 2014-08-10 DIAGNOSIS — I10 Essential (primary) hypertension: Secondary | ICD-10-CM | POA: Insufficient documentation

## 2014-08-10 DIAGNOSIS — F0390 Unspecified dementia without behavioral disturbance: Secondary | ICD-10-CM | POA: Insufficient documentation

## 2014-08-10 DIAGNOSIS — Z950 Presence of cardiac pacemaker: Secondary | ICD-10-CM | POA: Diagnosis not present

## 2014-08-10 DIAGNOSIS — Z7952 Long term (current) use of systemic steroids: Secondary | ICD-10-CM | POA: Diagnosis not present

## 2014-08-10 DIAGNOSIS — Z872 Personal history of diseases of the skin and subcutaneous tissue: Secondary | ICD-10-CM | POA: Diagnosis not present

## 2014-08-10 DIAGNOSIS — Z87448 Personal history of other diseases of urinary system: Secondary | ICD-10-CM | POA: Diagnosis not present

## 2014-08-10 DIAGNOSIS — E785 Hyperlipidemia, unspecified: Secondary | ICD-10-CM | POA: Diagnosis not present

## 2014-08-10 DIAGNOSIS — Z79899 Other long term (current) drug therapy: Secondary | ICD-10-CM | POA: Insufficient documentation

## 2014-08-10 DIAGNOSIS — R1110 Vomiting, unspecified: Secondary | ICD-10-CM | POA: Diagnosis not present

## 2014-08-10 DIAGNOSIS — R011 Cardiac murmur, unspecified: Secondary | ICD-10-CM | POA: Diagnosis not present

## 2014-08-10 LAB — CBC WITH DIFFERENTIAL/PLATELET
Basophils Absolute: 0 10*3/uL (ref 0.0–0.1)
Basophils Relative: 1 % (ref 0–1)
EOS PCT: 3 % (ref 0–5)
Eosinophils Absolute: 0.2 10*3/uL (ref 0.0–0.7)
HCT: 37.2 % — ABNORMAL LOW (ref 39.0–52.0)
Hemoglobin: 12.4 g/dL — ABNORMAL LOW (ref 13.0–17.0)
LYMPHS ABS: 0.9 10*3/uL (ref 0.7–4.0)
Lymphocytes Relative: 16 % (ref 12–46)
MCH: 29.7 pg (ref 26.0–34.0)
MCHC: 33.3 g/dL (ref 30.0–36.0)
MCV: 89.2 fL (ref 78.0–100.0)
MONO ABS: 0.7 10*3/uL (ref 0.1–1.0)
MONOS PCT: 13 % — AB (ref 3–12)
NEUTROS ABS: 3.8 10*3/uL (ref 1.7–7.7)
NEUTROS PCT: 67 % (ref 43–77)
Platelets: 152 10*3/uL (ref 150–400)
RBC: 4.17 MIL/uL — AB (ref 4.22–5.81)
RDW: 13.3 % (ref 11.5–15.5)
WBC: 5.7 10*3/uL (ref 4.0–10.5)

## 2014-08-10 LAB — COMPREHENSIVE METABOLIC PANEL
ALK PHOS: 110 U/L (ref 39–117)
ALT: 17 U/L (ref 0–53)
AST: 23 U/L (ref 0–37)
Albumin: 3.7 g/dL (ref 3.5–5.2)
Anion gap: 6 (ref 5–15)
BUN: 11 mg/dL (ref 6–23)
CALCIUM: 8.9 mg/dL (ref 8.4–10.5)
CO2: 25 mmol/L (ref 19–32)
Chloride: 108 mmol/L (ref 96–112)
Creatinine, Ser: 1 mg/dL (ref 0.50–1.35)
GFR calc Af Amer: 85 mL/min — ABNORMAL LOW (ref >90)
GFR calc non Af Amer: 74 mL/min — ABNORMAL LOW (ref >90)
GLUCOSE: 102 mg/dL — AB (ref 70–99)
Potassium: 4 mmol/L (ref 3.5–5.1)
Sodium: 139 mmol/L (ref 135–145)
TOTAL PROTEIN: 6.5 g/dL (ref 6.0–8.3)
Total Bilirubin: 0.9 mg/dL (ref 0.3–1.2)

## 2014-08-10 MED ORDER — SODIUM CHLORIDE 0.9 % IV BOLUS (SEPSIS)
500.0000 mL | Freq: Once | INTRAVENOUS | Status: AC
  Administered 2014-08-10: 500 mL via INTRAVENOUS

## 2014-08-10 MED ORDER — ONDANSETRON HCL 4 MG/2ML IJ SOLN
4.0000 mg | Freq: Once | INTRAMUSCULAR | Status: AC
Start: 2014-08-10 — End: 2014-08-10
  Administered 2014-08-10: 4 mg via INTRAVENOUS
  Filled 2014-08-10: qty 2

## 2014-08-10 NOTE — Discharge Instructions (Signed)
Follow up with your family md this week °

## 2014-08-10 NOTE — ED Notes (Signed)
Bed: [id number] Expected date: 08/10/14 Expected time: 10:32 AM Means of arrival: Ambulance Comments: Anxiety

## 2014-08-10 NOTE — ED Notes (Signed)
Patient transported to X-ray 

## 2014-08-10 NOTE — ED Notes (Signed)
[facility] called for transport back to [facility].

## 2014-08-10 NOTE — ED Notes (Signed)
Bed: WHALD Expected date:  Expected time:  Means of arrival:  Comments: 

## 2014-08-10 NOTE — ED Notes (Addendum)
Per [facility]- Pt seen yesterday at [facility] for presenting  Complaint. Release back to [place] [facility]. DX of dizziness. Returns today for same because symptoms are not resolving. Pt 's wife died last week. Symptoms of anxiety and depression began after this event. Denies any other complaints. DENIES SI/HI

## 2014-08-10 NOTE — ED Notes (Addendum)
[facility] GIVEN DISCHARGE INSTRUCTIONS

## 2014-08-11 NOTE — ED Provider Notes (Signed)
CSN: [id number]     Arrival date & time 08/10/14  1038 History   First MD Initiated Contact with Patient 08/10/14 1053     Chief Complaint  Patient presents with  . Anxiety  . Depression     (Consider location/radiation/quality/duration/timing/severity/associated sxs/prior Treatment) Patient is a 74 y.o. male presenting with anxiety. The history is provided by the patient (the pt complains of vomiting once and anxiety.  he was evaluated yesterday).  Anxiety This is a recurrent problem. The current episode started 3 to 5 hours ago. The problem occurs rarely. The problem has been resolved. Pertinent negatives include no chest pain, no abdominal pain and no headaches. Nothing aggravates the symptoms. Nothing relieves the symptoms.    Past Medical History  Diagnosis Date  . Near syncope   . Sinus bradycardia   . Diastolic murmur   . Hypertension   . Renal insufficiency   . History of tobacco abuse   . Syncope   . Dementia   . Seborrheic dermatitis   . Hyperlipidemia    Past Surgical History  Procedure Laterality Date  . Pacemaker insertion  03/19/13    SJM pacemaker implanted for symptomatic sinus bradycardia by Dr Johney FrameAllred  . Kidney stone surgery  06/2013  . Multiple tooth extractions      Full Dentures  . Permanent pacemaker insertion N/A 03/19/2013    Procedure: PERMANENT PACEMAKER INSERTION;  Surgeon: Gardiner RhymeJames D Allred, MD;  Location: [facility]ATH [facility];  Service: Cardiovascular;  Laterality: N/A;   Family History  Problem Relation Age of Onset  . Heart disease Father     Deceased-90s  . Cancer Father   . Stroke Mother 290    Deceased  . Heart disease Mother   . Dementia Mother   . Healthy Brother     x1  . Hypertension Daughter     x1   History  Substance Use Topics  . Smoking status: Former Smoker -- 3.00 packs/day for 39 years    Types: Cigarettes    Quit date: 06/14/1996  . Smokeless tobacco: Never Used     Comment: use to smoke 3 packs per day for 6 years quit in 1993   . Alcohol Use: No    Review of Systems  Constitutional: Negative for appetite change and fatigue.  HENT: Negative for congestion, ear discharge and sinus pressure.   Eyes: Negative for discharge.  Respiratory: Negative for cough.   Cardiovascular: Negative for chest pain.  Gastrointestinal: Positive for vomiting. Negative for abdominal pain and diarrhea.  Genitourinary: Negative for frequency and hematuria.  Musculoskeletal: Negative for back pain.  Skin: Negative for rash.  Neurological: Negative for seizures and headaches.  Psychiatric/Behavioral: Positive for agitation. Negative for suicidal ideas and hallucinations.      Allergies  Review of patient's allergies indicates no known allergies.  Home Medications   Prior to Admission medications   Medication Sig Start Date End Date Taking? Authorizing Provider  acetaminophen (TYLENOL) 325 MG tablet Take 650 mg by mouth every 6 (six) hours as needed for mild pain.   Yes Historical Provider, MD  albuterol (PROVENTIL HFA;VENTOLIN HFA) 108 (90 BASE) MCG/ACT inhaler Inhale 2 puffs into the lungs every 6 (six) hours as needed for wheezing or shortness of breath.   Yes Historical Provider, MD  atorvastatin (LIPITOR) 10 MG tablet TAKE 1 TABLET BY MOUTH ONCE DAILY. 11/26/13  Yes Waldon MerlWilliam C Martin, PA-C  donepezil (ARICEPT) 10 MG tablet Take 1 tablet (10 mg total) by mouth daily. 07/09/14  Yes Van Clines, MD  ergocalciferol (VITAMIN D2) 50000 UNITS capsule Take 50,000 Units by mouth once a week.   Yes Historical Provider, MD  guaiFENesin (MUCINEX) 600 MG 12 hr tablet Take 600 mg by mouth 2 (two) times daily as needed for cough or to loosen phlegm.   Yes Historical Provider, MD  ketoconazole (NIZORAL) 2 % cream Apply topically 2 (two) times daily. 07/27/14  Yes Luisa Dago, MD  levothyroxine (SYNTHROID, LEVOTHROID) 50 MCG tablet Take 50 mcg by mouth daily before breakfast.   Yes Historical Provider, MD  losartan (COZAAR) 50 MG tablet Take  1 tablet (50 mg total) by mouth daily. 05/31/14  Yes Waldon Merl, PA-C  ranitidine (ZANTAC) 150 MG tablet Take 150 mg by mouth 2 (two) times daily.   Yes Historical Provider, MD  hydrocortisone cream 1 % Apply topically 2 (two) times daily. 07/27/14   Luisa Dago, MD  sodium chloride (OCEAN) 0.65 % SOLN nasal spray Place 1 spray into both nostrils 2 (two) times daily as needed for congestion.    Historical Provider, MD   BP 132/64 mmHg  Pulse 77  Temp(Src) 98.2 F (36.8 C) (Oral)  Resp 18  SpO2 97% Physical Exam  Constitutional: He is oriented to person, place, and time. He appears well-developed.  HENT:  Head: Normocephalic.  Eyes: Conjunctivae and EOM are normal. No scleral icterus.  Neck: Neck supple. No thyromegaly present.  Cardiovascular: Normal rate and regular rhythm.  Exam reveals no gallop and no friction rub.   No murmur heard. Pulmonary/Chest: No stridor. He has no wheezes. He has no rales. He exhibits no tenderness.  Abdominal: He exhibits no distension. There is no tenderness. There is no rebound.  Musculoskeletal: Normal range of motion. He exhibits no edema.  Lymphadenopathy:    He has no cervical adenopathy.  Neurological: He is oriented to person, place, and time. He exhibits normal muscle tone. Coordination normal.  Skin: No rash noted. No erythema.  Psychiatric:  Not suicidal or homicidal    ED Course  Procedures (including critical care time) Labs Review Labs Reviewed  COMPREHENSIVE METABOLIC PANEL - Abnormal; Notable for the following:    Glucose, Bld 102 (*)    GFR calc non Af Amer 74 (*)    GFR calc Af Amer 85 (*)    All other components within normal limits  CBC WITH DIFFERENTIAL/PLATELET - Abnormal; Notable for the following:    RBC 4.17 (*)    Hemoglobin 12.4 (*)    HCT 37.2 (*)    Monocytes Relative 13 (*)    All other components within normal limits  CBC WITH DIFFERENTIAL/PLATELET    Imaging Review Ct Head Wo Contrast  08/09/2014    CLINICAL DATA:  Dizziness and loss of balance  EXAM: CT HEAD WITHOUT CONTRAST  TECHNIQUE: Contiguous axial images were obtained from the base of the skull through the vertex without intravenous contrast.  COMPARISON:  April 24, 2014  FINDINGS: Mild diffuse atrophy is stable. Small stable frontal subdural hygromas remain without change. Mild mass effect on each frontal horn is stable. No midline shift. Similar changes in the cerebellum bilaterally are stable with small subdural hygromas bilaterally in the cerebellar region causing mild mass effect but no midline shift. There is no intracranial mass, acute hemorrhage, or acute appearing infarct. There is minimal periventricular small vessel disease in the centra semiovale bilaterally. The bony calvarium appears intact. The mastoid air cells are clear on the left. There is opacification of several  inferior mastoid air cells on the right. There is mild mucosal thickening in several ethmoid air cells bilaterally.  IMPRESSION: Opacification of several inferior right mastoid air cells. Question source of dizziness. There is also mild ethmoid sinus disease.  Mild atrophy with subdural hygromas in each frontal region and cerebellar region causing mild chronic mass effect but no midline shift. These are stable changes. No acute hemorrhage. No intra-axial mass or acute infarct. Minimal periventricular small vessel disease.   Electronically Signed   By: Bretta Bang III M.D.   On: 08/09/2014 15:02   Dg Chest Portable 1 View  08/09/2014   CLINICAL DATA:  Dizziness and shortness of breath for 2 hours  EXAM: PORTABLE CHEST - 1 VIEW  COMPARISON:  07/26/2014  FINDINGS: Cardiac shadow again demonstrates mild enlargement. A pacing device is again seen. The lungs are clear bilaterally. No bony abnormality is seen.  IMPRESSION: No acute abnormality noted.   Electronically Signed   By: Alcide Clever M.D.   On: 08/09/2014 14:35   Dg Abd Acute W/chest  08/10/2014   CLINICAL  DATA:  Recent vomiting  EXAM: ACUTE ABDOMEN SERIES (ABDOMEN 2 VIEW & CHEST 1 VIEW)  COMPARISON:  08/09/2014  FINDINGS: Cardiac shadow is within normal limits. A pacing device is again seen and stable. Minimal left basilar atelectasis is now seen.  The upper abdomen demonstrates scattered large and small bowel gas. No definitive obstructive changes are seen. No free air is noted.  IMPRESSION: Nonspecific chest and abdomen.   Electronically Signed   By: Alcide Clever M.D.   On: 08/10/2014 11:46     EKG Interpretation None      MDM   Final diagnoses:  Vomiting  Anxiety    Vomiting anc anxiety resolve,  Follow up with pcp    Benny Lennert, MD 08/11/14 [phone]

## 2014-08-19 ENCOUNTER — Encounter: Payer: Self-pay | Admitting: Internal Medicine

## 2014-08-28 ENCOUNTER — Ambulatory Visit: Payer: PRIVATE HEALTH INSURANCE | Admitting: Physician Assistant

## 2014-08-30 ENCOUNTER — Ambulatory Visit: Payer: PRIVATE HEALTH INSURANCE | Admitting: Physician Assistant

## 2014-09-08 ENCOUNTER — Encounter (HOSPITAL_COMMUNITY): Payer: Self-pay | Admitting: Emergency Medicine

## 2014-09-08 ENCOUNTER — Inpatient Hospital Stay (HOSPITAL_COMMUNITY)
Admission: EM | Admit: 2014-09-08 | Discharge: 2014-09-11 | DRG: 287 | Disposition: A | Discharge status: Home / Self Care | Payer: Medicare Other | Attending: Family Medicine | Admitting: Family Medicine

## 2014-09-08 ENCOUNTER — Emergency Department (HOSPITAL_COMMUNITY): Payer: Medicare Other

## 2014-09-08 DIAGNOSIS — Z719 Counseling, unspecified: Secondary | ICD-10-CM

## 2014-09-08 DIAGNOSIS — F0390 Unspecified dementia without behavioral disturbance: Secondary | ICD-10-CM | POA: Diagnosis not present

## 2014-09-08 DIAGNOSIS — I10 Essential (primary) hypertension: Secondary | ICD-10-CM | POA: Diagnosis present

## 2014-09-08 DIAGNOSIS — R0789 Other chest pain: Principal | ICD-10-CM | POA: Diagnosis present

## 2014-09-08 DIAGNOSIS — I5189 Other ill-defined heart diseases: Secondary | ICD-10-CM | POA: Diagnosis present

## 2014-09-08 DIAGNOSIS — E039 Hypothyroidism, unspecified: Secondary | ICD-10-CM | POA: Diagnosis present

## 2014-09-08 DIAGNOSIS — I129 Hypertensive chronic kidney disease with stage 1 through stage 4 chronic kidney disease, or unspecified chronic kidney disease: Secondary | ICD-10-CM | POA: Diagnosis present

## 2014-09-08 DIAGNOSIS — Z7982 Long term (current) use of aspirin: Secondary | ICD-10-CM

## 2014-09-08 DIAGNOSIS — I5030 Unspecified diastolic (congestive) heart failure: Secondary | ICD-10-CM | POA: Diagnosis present

## 2014-09-08 DIAGNOSIS — Z87442 Personal history of urinary calculi: Secondary | ICD-10-CM

## 2014-09-08 DIAGNOSIS — N390 Urinary tract infection, site not specified: Secondary | ICD-10-CM | POA: Diagnosis present

## 2014-09-08 DIAGNOSIS — Z950 Presence of cardiac pacemaker: Secondary | ICD-10-CM | POA: Diagnosis present

## 2014-09-08 DIAGNOSIS — N182 Chronic kidney disease, stage 2 (mild): Secondary | ICD-10-CM | POA: Diagnosis present

## 2014-09-08 DIAGNOSIS — Z87891 Personal history of nicotine dependence: Secondary | ICD-10-CM

## 2014-09-08 DIAGNOSIS — R079 Chest pain, unspecified: Secondary | ICD-10-CM | POA: Diagnosis present

## 2014-09-08 DIAGNOSIS — E785 Hyperlipidemia, unspecified: Secondary | ICD-10-CM | POA: Diagnosis present

## 2014-09-08 DIAGNOSIS — I351 Nonrheumatic aortic (valve) insufficiency: Secondary | ICD-10-CM | POA: Diagnosis present

## 2014-09-08 DIAGNOSIS — R0602 Shortness of breath: Secondary | ICD-10-CM

## 2014-09-08 DIAGNOSIS — F03B0 Unspecified dementia, moderate, without behavioral disturbance, psychotic disturbance, mood disturbance, and anxiety: Secondary | ICD-10-CM | POA: Diagnosis present

## 2014-09-08 DIAGNOSIS — I77819 Aortic ectasia, unspecified site: Secondary | ICD-10-CM | POA: Diagnosis present

## 2014-09-08 LAB — COMPREHENSIVE METABOLIC PANEL
ALT: 15 U/L (ref 0–53)
AST: 23 U/L (ref 0–37)
Albumin: 3.4 g/dL — ABNORMAL LOW (ref 3.5–5.2)
Alkaline Phosphatase: 100 U/L (ref 39–117)
Anion gap: 7 (ref 5–15)
BILIRUBIN TOTAL: 0.7 mg/dL (ref 0.3–1.2)
BUN: 15 mg/dL (ref 6–23)
CHLORIDE: 108 mmol/L (ref 96–112)
CO2: 25 mmol/L (ref 19–32)
Calcium: 8.8 mg/dL (ref 8.4–10.5)
Creatinine, Ser: 1.19 mg/dL (ref 0.50–1.35)
GFR calc Af Amer: 69 mL/min — ABNORMAL LOW (ref >90)
GFR, EST NON AFRICAN AMERICAN: 60 mL/min — AB (ref >90)
GLUCOSE: 119 mg/dL — AB (ref 70–99)
Potassium: 4.3 mmol/L (ref 3.5–5.1)
Sodium: 140 mmol/L (ref 135–145)
Total Protein: 5.8 g/dL — ABNORMAL LOW (ref 6.0–8.3)

## 2014-09-08 LAB — URINALYSIS, ROUTINE W REFLEX MICROSCOPIC
BILIRUBIN URINE: NEGATIVE
Glucose, UA: NEGATIVE mg/dL
KETONES UR: NEGATIVE mg/dL
Nitrite: NEGATIVE
PH: 6 (ref 5.0–8.0)
Protein, ur: NEGATIVE mg/dL
SPECIFIC GRAVITY, URINE: 1.008 (ref 1.005–1.030)
UROBILINOGEN UA: 0.2 mg/dL (ref 0.0–1.0)

## 2014-09-08 LAB — CBC WITH DIFFERENTIAL/PLATELET
BASOS ABS: 0 10*3/uL (ref 0.0–0.1)
BASOS PCT: 1 % (ref 0–1)
Eosinophils Absolute: 0.2 10*3/uL (ref 0.0–0.7)
Eosinophils Relative: 4 % (ref 0–5)
HCT: 37.2 % — ABNORMAL LOW (ref 39.0–52.0)
Hemoglobin: 12.5 g/dL — ABNORMAL LOW (ref 13.0–17.0)
Lymphocytes Relative: 19 % (ref 12–46)
Lymphs Abs: 1.3 10*3/uL (ref 0.7–4.0)
MCH: 29.7 pg (ref 26.0–34.0)
MCHC: 33.6 g/dL (ref 30.0–36.0)
MCV: 88.4 fL (ref 78.0–100.0)
Monocytes Absolute: 0.8 10*3/uL (ref 0.1–1.0)
Monocytes Relative: 12 % (ref 3–12)
NEUTROS PCT: 64 % (ref 43–77)
Neutro Abs: 4.2 10*3/uL (ref 1.7–7.7)
PLATELETS: 155 10*3/uL (ref 150–400)
RBC: 4.21 MIL/uL — ABNORMAL LOW (ref 4.22–5.81)
RDW: 13.2 % (ref 11.5–15.5)
WBC: 6.5 10*3/uL (ref 4.0–10.5)

## 2014-09-08 LAB — TROPONIN I
Troponin I: <0.03 ng/mL (ref <0.031)
Troponin I: <0.03 ng/mL (ref <0.031)

## 2014-09-08 LAB — BRAIN NATRIURETIC PEPTIDE: B Natriuretic Peptide: 21.1 pg/mL (ref 0.0–100.0)

## 2014-09-08 LAB — LIPASE, BLOOD: Lipase: 33 U/L (ref 11–59)

## 2014-09-08 LAB — URINE MICROSCOPIC-ADD ON

## 2014-09-08 LAB — D-DIMER, QUANTITATIVE: D-Dimer, Quant: 0.43 ug/mL-FEU (ref 0.00–0.48)

## 2014-09-08 MED ORDER — MELOXICAM 7.5 MG PO TABS
7.5000 mg | ORAL_TABLET | Freq: Every morning | ORAL | Status: DC
  Administered 2014-09-09 – 2014-09-11 (×3): 7.5 mg via ORAL
  Filled 2014-09-08 (×3): qty 1

## 2014-09-08 MED ORDER — ALBUTEROL SULFATE HFA 108 (90 BASE) MCG/ACT IN AERS: 2.0000 | INHALATION_SPRAY | Freq: Four times a day (QID) | RESPIRATORY_TRACT | Status: DC | PRN

## 2014-09-08 MED ORDER — FAMOTIDINE 20 MG PO TABS
20.0000 mg | ORAL_TABLET | Freq: Two times a day (BID) | ORAL | Status: DC
  Administered 2014-09-08 – 2014-09-11 (×6): 20 mg via ORAL
  Filled 2014-09-08 (×6): qty 1

## 2014-09-08 MED ORDER — SODIUM CHLORIDE 0.9 % IV BOLUS (SEPSIS)
500.0000 mL | Freq: Once | INTRAVENOUS | Status: AC
Start: 2014-09-08 — End: 2014-09-08
  Administered 2014-09-08: 500 mL via INTRAVENOUS

## 2014-09-08 MED ORDER — SALINE SPRAY 0.65 % NA SOLN
1.0000 | Freq: Two times a day (BID) | NASAL | Status: DC | PRN
  Filled 2014-09-08: qty 44

## 2014-09-08 MED ORDER — FLUTICASONE PROPIONATE 50 MCG/ACT NA SUSP
2.0000 | Freq: Every day | NASAL | Status: DC
  Administered 2014-09-09 – 2014-09-11 (×3): 2 via NASAL
  Filled 2014-09-08: qty 16

## 2014-09-08 MED ORDER — LORATADINE 10 MG PO TABS
10.0000 mg | ORAL_TABLET | Freq: Every day | ORAL | Status: DC
  Administered 2014-09-08 – 2014-09-11 (×4): 10 mg via ORAL
  Filled 2014-09-08 (×4): qty 1

## 2014-09-08 MED ORDER — TIZANIDINE HCL 4 MG PO CAPS: 4.0000 mg | ORAL_CAPSULE | Freq: Three times a day (TID) | ORAL | Status: DC

## 2014-09-08 MED ORDER — NITROGLYCERIN 0.4 MG SL SUBL
0.4000 mg | SUBLINGUAL_TABLET | SUBLINGUAL | Status: DC | PRN
  Administered 2014-09-09 (×2): 0.4 mg via SUBLINGUAL

## 2014-09-08 MED ORDER — ENOXAPARIN SODIUM 40 MG/0.4ML ~~LOC~~ SOLN
40.0000 mg | SUBCUTANEOUS | Status: DC
  Administered 2014-09-08 – 2014-09-09 (×2): 40 mg via SUBCUTANEOUS
  Filled 2014-09-08 (×2): qty 0.4

## 2014-09-08 MED ORDER — ALBUTEROL SULFATE (2.5 MG/3ML) 0.083% IN NEBU: 2.5000 mg | INHALATION_SOLUTION | Freq: Four times a day (QID) | RESPIRATORY_TRACT | Status: DC | PRN

## 2014-09-08 MED ORDER — SODIUM CHLORIDE 0.9 % IV SOLN
INTRAVENOUS | Status: AC
  Administered 2014-09-08 – 2014-09-09 (×2): via INTRAVENOUS

## 2014-09-08 MED ORDER — LEVOTHYROXINE SODIUM 50 MCG PO TABS
50.0000 ug | ORAL_TABLET | Freq: Every day | ORAL | Status: DC
  Administered 2014-09-09 – 2014-09-11 (×3): 50 ug via ORAL
  Filled 2014-09-08 (×3): qty 1

## 2014-09-08 MED ORDER — ASPIRIN 81 MG PO CHEW
81.0000 mg | CHEWABLE_TABLET | Freq: Every day | ORAL | Status: DC
  Administered 2014-09-09 – 2014-09-11 (×2): 81 mg via ORAL
  Filled 2014-09-08 (×2): qty 1

## 2014-09-08 MED ORDER — NAPROXEN SODIUM 220 MG PO TABS: 220.0000 mg | ORAL_TABLET | Freq: Two times a day (BID) | ORAL | Status: DC | PRN

## 2014-09-08 MED ORDER — ONDANSETRON HCL 4 MG/2ML IJ SOLN
4.0000 mg | Freq: Four times a day (QID) | INTRAMUSCULAR | Status: DC | PRN
  Administered 2014-09-09: 4 mg via INTRAVENOUS

## 2014-09-08 MED ORDER — TIZANIDINE HCL 4 MG PO TABS
4.0000 mg | ORAL_TABLET | Freq: Three times a day (TID) | ORAL | Status: DC
  Administered 2014-09-08 – 2014-09-11 (×8): 4 mg via ORAL
  Filled 2014-09-08 (×10): qty 1

## 2014-09-08 MED ORDER — DONEPEZIL HCL 10 MG PO TABS
10.0000 mg | ORAL_TABLET | Freq: Every day | ORAL | Status: DC
  Administered 2014-09-08 – 2014-09-11 (×4): 10 mg via ORAL
  Filled 2014-09-08 (×4): qty 1

## 2014-09-08 MED ORDER — ACETAMINOPHEN 500 MG PO TABS
500.0000 mg | ORAL_TABLET | Freq: Once | ORAL | Status: AC
  Administered 2014-09-08: 500 mg via ORAL
  Filled 2014-09-08: qty 1

## 2014-09-08 MED ORDER — MECLIZINE HCL 25 MG PO TABS: 25.0000 mg | ORAL_TABLET | Freq: Three times a day (TID) | ORAL | Status: DC | PRN

## 2014-09-08 MED ORDER — ALUM & MAG HYDROXIDE-SIMETH 200-200-20 MG/5ML PO SUSP: 30.0000 mL | Freq: Four times a day (QID) | ORAL | Status: DC | PRN

## 2014-09-08 MED ORDER — ACETAMINOPHEN 325 MG PO TABS
650.0000 mg | ORAL_TABLET | ORAL | Status: DC | PRN
  Administered 2014-09-09: 650 mg via ORAL
  Filled 2014-09-08: qty 2

## 2014-09-08 MED ORDER — ATORVASTATIN CALCIUM 10 MG PO TABS
10.0000 mg | ORAL_TABLET | Freq: Every day | ORAL | Status: DC
  Administered 2014-09-09 – 2014-09-10 (×2): 10 mg via ORAL
  Filled 2014-09-08 (×2): qty 1

## 2014-09-08 MED ORDER — NAPROXEN 250 MG PO TABS: 250.0000 mg | ORAL_TABLET | Freq: Two times a day (BID) | ORAL | Status: DC | PRN

## 2014-09-08 NOTE — ED Provider Notes (Signed)
CSN: [id number]     Arrival date & time 09/08/14  1357 History   First MD Initiated Contact with Patient 09/08/14 1401     Chief Complaint  Patient presents with  . Shortness of Breath   Cody Vincent is a 74 y.o. male with a history of renal insufficiency, hypertension, hyperlipidemia, diastolic murmur, and dual-chamber pacemaker who [facility] complaining of shortness of breath starting today. Patient is also complaining of left upper quadrant abdominal pain after sitting on the toilet. He reports this lasted a few minutes and resolved. He reports now his pain is moved higher and is more in his left lateral chest. He rates pain at a 4 out of 10 and describes it as sharp. He reports his pain is worse with the bumps in the ambulance. He does report shortness of breath started today but no cough. He also complains of some palpitations. His pain is nonradiating. She reports having a normal bowel movement today. The patient denies fevers, chills, cough, wheezing, nausea, vomiting, dysuria, hematuria, hematochezia, leg pain, leg swelling, headache, or lightheadedness.  (Consider location/radiation/quality/duration/timing/severity/associated sxs/prior Treatment) HPI  Past Medical History  Diagnosis Date  . Near syncope   . Sinus bradycardia   . Diastolic murmur   . Hypertension   . Renal insufficiency   . History of tobacco abuse   . Syncope   . Dementia   . Seborrheic dermatitis   . Hyperlipidemia    Past Surgical History  Procedure Laterality Date  . Pacemaker insertion  03/19/13    SJM pacemaker implanted for symptomatic sinus bradycardia by Dr Johney FrameAllred  . Kidney stone surgery  06/2013  . Multiple tooth extractions      Full Dentures  . Permanent pacemaker insertion N/A 03/19/2013    Procedure: PERMANENT PACEMAKER INSERTION;  Surgeon: Gardiner RhymeJames D Allred, MD;  Location: [facility]ATH [facility];  Service: Cardiovascular;  Laterality: N/A;    Family History  Problem Relation Age of Onset  . Heart disease Father     Deceased-90s  . Cancer Father   . Stroke Mother 690    Deceased  . Heart disease Mother   . Dementia Mother   . Healthy Brother     x1  . Hypertension Daughter     x1   History  Substance Use Topics  . Smoking status: Former Smoker -- 3.00 packs/day for 39 years    Types: Cigarettes    Quit date: 06/14/1996  . Smokeless tobacco: Never Used     Comment: use to smoke 3 packs per day for 6 years quit in 1993  . Alcohol Use: No    Review of Systems  Constitutional: Negative for fever and chills.  HENT: Negative for congestion, ear pain and sore throat.   Eyes: Negative for pain and visual disturbance.  Respiratory: Positive for shortness of breath. Negative for cough, chest tightness and wheezing.   Cardiovascular: Positive for chest pain and palpitations. Negative for leg swelling.  Gastrointestinal: Positive for abdominal pain. Negative for nausea, vomiting, diarrhea and blood in stool.  Genitourinary: Negative for dysuria, urgency, frequency, hematuria, penile swelling, scrotal swelling, difficulty urinating, penile pain and testicular pain.  Musculoskeletal: Negative for back pain and neck pain.  Skin: Negative for rash.  Neurological: Negative for light-headedness, numbness and headaches.      Allergies  Review of patient's allergies indicates no known allergies.  Home Medications   Prior to Admission medications   Medication Sig Start Date  End Date Taking? Authorizing Provider  acetaminophen (TYLENOL) 325 MG tablet Take 650 mg by mouth every 6 (six) hours as needed for mild pain.    Historical Provider, MD  albuterol (PROVENTIL HFA;VENTOLIN HFA) 108 (90 BASE) MCG/ACT inhaler Inhale 2 puffs into the lungs every 6 (six) hours as needed for wheezing or shortness of breath.    Historical Provider, MD  atorvastatin (LIPITOR) 10 MG tablet TAKE 1 TABLET BY MOUTH ONCE DAILY. 11/26/13   Waldon Merl, PA-C  donepezil (ARICEPT) 10 MG tablet Take 1 tablet (10 mg total) by mouth daily. 07/09/14   Van Clines, MD  ergocalciferol (VITAMIN D2) 50000 UNITS capsule Take 50,000 Units by mouth once a week.    Historical Provider, MD  guaiFENesin (MUCINEX) 600 MG 12 hr tablet Take 600 mg by mouth 2 (two) times daily as needed for cough or to loosen phlegm.    Historical Provider, MD  hydrocortisone cream 1 % Apply topically 2 (two) times daily. 07/27/14   Donavan Foil, MD  ketoconazole (NIZORAL) 2 % cream Apply topically 2 (two) times daily. 07/27/14   Donavan Foil, MD  levothyroxine (SYNTHROID, LEVOTHROID) 50 MCG tablet Take 50 mcg by mouth daily before breakfast.    Historical Provider, MD  losartan (COZAAR) 50 MG tablet Take 1 tablet (50 mg total) by mouth daily. 05/31/14   Waldon Merl, PA-C  ranitidine (ZANTAC) 150 MG tablet Take 150 mg by mouth 2 (two) times daily.    Historical Provider, MD  sodium chloride (OCEAN) 0.65 % SOLN nasal spray Place 1 spray into both nostrils 2 (two) times daily as needed for congestion.    Historical Provider, MD   BP 99/51 mmHg  Pulse 63  Resp 19  Ht 5\' 8"  (1.727 m)  Wt 220 lb (99.791 kg)  BMI 33.46 kg/m2  SpO2 97% Physical Exam  Constitutional: He is oriented to person, place, and time. He appears well-developed and well-nourished. No distress.  HENT:  Head: Normocephalic and atraumatic.  Mouth/Throat: Oropharynx is clear and moist. No oropharyngeal exudate.  Eyes: Conjunctivae are normal. Pupils are equal, round, and reactive to light. Right eye exhibits no discharge. Left eye exhibits no discharge.  Neck: Neck supple. No JVD present. No tracheal deviation present.  Cardiovascular: Normal rate, regular rhythm and intact distal pulses.  Exam reveals no gallop and no friction rub.   Murmur heard. Bilateral radial, posterior tibialis and dorsalis pedis pulses are intact.  Faint diastolic murmur noted.   Pulmonary/Chest: Effort normal and  breath sounds normal. No respiratory distress. He has no wheezes. He has no rales. He exhibits no tenderness.  Abdominal: Soft. Bowel sounds are normal. He exhibits no distension and no mass. There is tenderness. There is no rebound and no guarding.  Abdomen is soft. Bowel sounds are present. Patient has mild left upper quadrant abdominal tenderness to palpation. No right lower quadrant tenderness to palpation. No abdominal distention, guarding or masses.  Musculoskeletal: He exhibits no edema or tenderness.  No lower extremity edema or tenderness.  Lymphadenopathy:    He has no cervical adenopathy.  Neurological: He is alert and oriented to person, place, and time. Coordination normal.  Patient is alert and oriented 3.  Skin: Skin is warm and dry. No rash noted. He is not diaphoretic. No erythema. No pallor.  Psychiatric: He has a normal mood and affect. His behavior is normal.  Nursing note and vitals reviewed.   ED Course  Procedures (including critical care  time) Labs Review Labs Reviewed  COMPREHENSIVE METABOLIC PANEL - Abnormal; Notable for the following:    Glucose, Bld 119 (*)    Total Protein 5.8 (*)    Albumin 3.4 (*)    GFR calc non Af Amer 60 (*)    GFR calc Af Amer 69 (*)    All other components within normal limits  CBC WITH DIFFERENTIAL/PLATELET - Abnormal; Notable for the following:    RBC 4.21 (*)    Hemoglobin 12.5 (*)    HCT 37.2 (*)    All other components within normal limits  URINALYSIS, ROUTINE W REFLEX MICROSCOPIC - Abnormal; Notable for the following:    APPearance CLOUDY (*)    Hgb urine dipstick SMALL (*)    Leukocytes, UA LARGE (*)    All other components within normal limits  URINE MICROSCOPIC-ADD ON - Abnormal; Notable for the following:    Bacteria, UA MANY (*)    All other components within normal limits  URINE CULTURE  LIPASE, BLOOD  TROPONIN I  BRAIN NATRIURETIC PEPTIDE  D-DIMER, QUANTITATIVE    Imaging Review Dg Chest 2  View  09/08/2014   CLINICAL DATA:  Shortness of Breath  EXAM: CHEST  2 VIEW  COMPARISON:  08/10/2014  FINDINGS: Cardiomediastinal silhouette is stable. Dual lead cardiac pacemaker is unchanged in position. No acute infiltrate or pleural effusion. No pulmonary edema. Mild degenerative changes thoracic spine.  IMPRESSION: No active cardiopulmonary disease.   Electronically Signed   By: Natasha Mead M.D.   On: 09/08/2014 14:48     EKG Interpretation   Date/Time:  Sunday September 08 2014 14:03:11 EDT Ventricular Rate:  62 PR Interval:  175 QRS Duration: 108 QT Interval:  414 QTC Calculation: 420 R Axis:   -48 Text Interpretation:  Sinus rhythm LAD, consider left anterior fascicular  block RSR' in V1 or V2, probably normal variant Borderline T wave  abnormalities pacing not noted on current ECG otherwise no significant  changes noted Confirmed by KNAPP  MD-J, JON ([id number]) on 09/08/2014 2:08:24  PM      Filed Vitals:   09/08/14 1530 09/08/14 1554 09/08/14 1600 09/08/14 1615  BP: 99/51  Pulse: 63 60 60 63  Resp: Height:      Weight:      SpO2: 96% 97% 98% 97%     MDM   Meds given in ED:  Medications  acetaminophen (TYLENOL) tablet 500 mg (500 mg Oral Given 09/08/14 1522)  sodium chloride 0.9 % bolus 500 mL (0 mLs Intravenous Stopped 09/08/14 1629)    New Prescriptions   No medications on file    Final diagnoses:  Chest pain of unknown etiology  Shortness of breath   This is a 74 y.o. male with a history of renal insufficiency, hypertension, hyperlipidemia, diastolic murmur, and dual-chamber pacemaker who presents to the emergency department from [facility] complaining of shortness of breath starting today. Patient is also complaining of left upper quadrant abdominal pain after sitting on the toilet. He reports this lasted a few minutes and resolved. He reports now his pain is moved higher and is more in his left lateral chest. He rates  pain at a 4 out of 10 and describes it as sharp. The patient is afebrile and nontoxic appearing. He is alert and oriented. His EKG shows no significant changes from previous. He has mild left lateral rib and left upper quadrant abdominal pain on exam. Initial troponin is  negative. D-dimer is negative. BNP is normal. Chest x-ray is negative. After speaking with my attending was decided this patient would benefit from a chest pain rule out admission. This patient was accepted for admission by Dr. Gonzella Lex for chest pain rule out.   This patient was discussed with and evaluated by Dr. Lynelle Doctor who agrees with assessment and plan.   Everlene Farrier, PA-C 09/08/14 [phone]

## 2014-09-08 NOTE — ED Notes (Signed)
[facility] short[facility] breath that started today. Pt denies any pain at this time. Pt does state that after lunch today he was sitting on the toliet and had some left sided chest pain and became short of breath. Pt is alert and ox3. 99% on room air.

## 2014-09-08 NOTE — H&P (Signed)
[facility] History and Physical  Cody Vincent ZOX:[id number] DOB: 1941-01-22 DOA: 09/08/2014  Referring physician: Dr. Iantha Fallen PCP: Koren Bound, NP   Chief Complaint:  Chest pain since one day  HPI:  74 year old male with history of hypertension, dyslipidemia, status post pacemaker for symptomatically bradycardia (follows with Dr. Johney Frame), moderate dementia (follows with Dr. Karel Jarvis), moderate aortic insufficiency who presented to the ED with acute onset chest pain this morning. Patient reports being in the bathroom when he had left-sided chest pain which was localized, sharp and lasting for about 1-2 minutes, 8/10 in severity and self subsided. The pain was nonradiating and not associated with any palpitations or shortness of breath. Patient is a poor historian and is not able to reveal much detail. He reports the pain again recurred several minutes later at the same area. Denies similar symptoms in the past. Patient denies headache, dizziness, fever, chills, nausea , vomiting,   abdominal pain, bowel or urinary symptoms. Denies change in weight or appetite. At baseline he is able to ambulate good distance.  Course in the ED Patient's vitals were stable except for low blood pressure of 87/50 mmHg which improved on its on. Blood work done was unremarkable. Initial troponin was negative. EKG showed sinus rhythm without any significant change. Chest x-ray was unremarkable. Hospitalist admission requested on observation.  Review of Systems:  As outlined in history of present illness. Full review of systems Limited due to patient's dementia.   Past Medical History  Diagnosis Date  . Near syncope   . Sinus bradycardia   . Diastolic murmur   . Hypertension   . Renal insufficiency   . History of tobacco abuse   . Syncope   . Dementia   . Seborrheic dermatitis   . Hyperlipidemia    Past Surgical History  Procedure Laterality Date  . Pacemaker insertion  03/19/13    SJM  pacemaker implanted for symptomatic sinus bradycardia by Dr Johney Frame  . Kidney stone surgery  06/2013  . Multiple tooth extractions      Full Dentures  . Permanent pacemaker insertion N/A 03/19/2013    Procedure: PERMANENT PACEMAKER INSERTION;  Surgeon: Gardiner Rhyme, MD;  Location: [facility] CATH [facility];  Service: Cardiovascular;  Laterality: N/A;   Social History:  reports that he quit smoking about 18 years ago. His smoking use included Cigarettes. He has a 117 pack-year smoking history. He has never used smokeless tobacco. He reports that he does not drink alcohol or use illicit drugs.  No Known Allergies  Family History  Problem Relation Age of Onset  . Heart disease Father     Deceased-90s  . Cancer Father   . Stroke Mother 65    Deceased  . Heart disease Mother   . Dementia Mother   . Healthy Brother     x1  . Hypertension Daughter     x1    Prior to Admission medications   Medication Sig Start Date End Date Taking? Authorizing Provider  albuterol (PROVENTIL HFA;VENTOLIN HFA) 108 (90 BASE) MCG/ACT inhaler Inhale 2 puffs into the lungs every 6 (six) hours as needed for wheezing or shortness of breath.   Yes Historical Provider, MD  cetirizine (ZYRTEC) 10 MG tablet Take 10 mg by mouth daily.   Yes Historical Provider, MD  donepezil (ARICEPT) 10 MG tablet Take 1 tablet (10 mg total) by mouth daily. 07/09/14  Yes Van Clines, MD  ergocalciferol (VITAMIN D2) 50000 UNITS capsule Take 50,000 Units by mouth once  a week.   Yes Historical Provider, MD  fluticasone (FLONASE) 50 MCG/ACT nasal spray Place 2 sprays into both nostrils daily.   Yes Historical Provider, MD  gabapentin (NEURONTIN) 100 MG capsule Take 100 mg by mouth See admin instructions. Pt takes  at bedtime x3 days, then  x3 days, then  daily   Yes Historical Provider, MD  guaiFENesin (MUCINEX) 600 MG 12 hr tablet Take 600 mg by mouth 2 (two) times daily as needed for cough or to loosen phlegm.   Yes Historical  Provider, MD  hydrocortisone cream 1 % Apply topically 2 (two) times daily. 07/27/14  Yes Adrian Blackwater Moding, MD  levothyroxine (SYNTHROID, LEVOTHROID) 50 MCG tablet Take 50 mcg by mouth daily before breakfast.   Yes Historical Provider, MD  losartan (COZAAR) 50 MG tablet Take 1 tablet (50 mg total) by mouth daily. 05/31/14  Yes Waldon Merl, PA-C  meclizine (ANTIVERT) 25 MG tablet Take 25 mg by mouth 3 (three) times daily as needed for dizziness.   Yes Historical Provider, MD  meloxicam (MOBIC) 7.5 MG tablet Take 7.5 mg by mouth every morning.   Yes Historical Provider, MD  naproxen sodium (ANAPROX) 220 MG tablet Take 220 mg by mouth 2 (two) times daily as needed (headaches).   Yes Historical Provider, MD  ranitidine (ZANTAC) 150 MG tablet Take 150 mg by mouth 2 (two) times daily.   Yes Historical Provider, MD  sodium chloride (OCEAN) 0.65 % SOLN nasal spray Place 1 spray into both nostrils 2 (two) times daily as needed for congestion.   Yes Historical Provider, MD  tiZANidine (ZANAFLEX) 4 MG capsule Take 4 mg by mouth 3 (three) times daily as needed for muscle spasms.   Yes Historical Provider, MD  acetaminophen (TYLENOL) 325 MG tablet Take 650 mg by mouth every 6 (six) hours as needed for mild pain.    Historical Provider, MD  atorvastatin (LIPITOR) 10 MG tablet TAKE 1 TABLET BY MOUTH ONCE DAILY. 11/26/13   Waldon Merl, PA-C  ketoconazole (NIZORAL) 2 % cream Apply topically 2 (two) times daily. 07/27/14   Donavan Foil, MD     Physical Exam:  Filed Vitals:   09/08/14 1554 09/08/14 1600 09/08/14 1615 09/08/14 1700  BP: 107/49  Pulse: 60 60 63 59  Resp: Height:      Weight:      SpO2: 97% 98% 97% 98%    Constitutional: Vital signs reviewed. Elderly male lying in bed in no acute distress  HEENT: no pallor, no icterus, moist oral mucosa, no cervical lymphadenopathy Cardiovascular: RRR, S1 normal, S2 normal, no MRG, pacemaker in place Chest: CTAB, no  wheezes, rales, or rhonchi Abdominal: Soft. Non-tender, non-distended, bowel sounds are normal,  Ext: warm, no edema Neurological: A&O x2, nonfocal  Labs on Admission:  Basic Metabolic Panel:  Recent Labs Lab 09/08/14 1459  NA 140  K 4.3  CL 108  CO2 25  GLUCOSE 119*  BUN 15  CREATININE 1.19  CALCIUM 8.8   Liver Function Tests:  Recent Labs Lab 09/08/14 1459  AST 23  ALT 15  ALKPHOS 100  BILITOT 0.7  PROT 5.8*  ALBUMIN 3.4*    Recent Labs Lab 09/08/14 1459  LIPASE 33   No results for input(s): AMMONIA in the last 168 hours. CBC:  Recent Labs Lab 09/08/14 1459  WBC 6.5  NEUTROABS 4.2  HGB 12.5*  HCT 37.2*  MCV 88.4  PLT 155  Cardiac Enzymes:  Recent Labs Lab 09/08/14 1459  TROPONINI <0.03   BNP: Invalid input(s): POCBNP CBG: No results for input(s): GLUCAP in the last 168 hours.  Radiological Exams on Admission: Dg Chest 2 View  09/08/2014   CLINICAL DATA:  Shortness of Breath  EXAM: CHEST  2 VIEW  COMPARISON:  08/10/2014  FINDINGS: Cardiomediastinal silhouette is stable. Dual lead cardiac pacemaker is unchanged in position. No acute infiltrate or pleural effusion. No pulmonary edema. Mild degenerative changes thoracic spine.  IMPRESSION: No active cardiopulmonary disease.   Electronically Signed   By: Natasha MeadLiviu  Pop M.D.   On: 09/08/2014 14:48    EKG: Normal sinus rhythm 62, no ST-T changes  Assessment/Plan  Principal Problem:   Chest pain Does not have a very typical symptoms however given underlying moderate dementia it is difficult to get exact history from the patient. Admit under observation to telemetry. His heart score for major cardiac event is 4.  Will rule out ACS with serial troponin and EKG. Order baby aspirin and sublingual nitroglycerin when necessary. -Discussed with cardiology on-call Dr Jon BillingsSivak who agrees with lexiscan myoview in am. NPO after midnight.continue statin.  Active Problems:   Essential hypertension Percent low  normal on admission. Will hold blood pressure medications and provide gentle hydration    S/P placement of cardiac pacemaker For symptomatically bradycardia.sees Dr Johney FrameAllred    Moderate dementia continue aricept         Diet:cardiac  DVT prophylaxis: sq lovenox   Code Status: full code Family Communication: None at bedside Disposition Plan: Admit under observation to telemetry  Inayah Woodin [facility] Pager [phone]  Total time spent on admission :45 minutes  If 7PM-7AM, please contact night-coverage [email or web address] Password [email or web address] 09/08/2014, 5:16 PM

## 2014-09-08 NOTE — ED Provider Notes (Signed)
Medical screening examination/treatment/procedure(s) were conducted as a shared visit with non-physician practitioner(s) and myself.  I personally evaluated the patient during the encounter.   EKG Interpretation   Date/Time:  Sunday September 08 2014 14:03:11 EDT Ventricular Rate:  62 PR Interval:  175 QRS Duration: 108 QT Interval:  414 QTC Calculation: 420 R Axis:   -48 Text Interpretation:  Sinus rhythm LAD, consider left anterior fascicular  block RSR' in V1 or V2, probably normal variant Borderline T wave  abnormalities pacing not noted on current ECG otherwise no significant  changes noted Confirmed by Reyanne Hussar  MD-J, Nicie Milan ([id number]) on 09/08/2014 2:08:24  PM      Pt presents with complaints of left sided chest, abdominal pain associated with shortness of breath.   No abdominal ttp on my exam.   Will check labs, xrays, urine.  Symptoms may be an anginal equivalent.  Will consider admission , serial enzymes unless alternative etiology noted.  Linwood DibblesJon Jock Mahon, MD 09/08/14 [phone]

## 2014-09-09 ENCOUNTER — Observation Stay (HOSPITAL_COMMUNITY): Payer: Medicare Other

## 2014-09-09 DIAGNOSIS — F0390 Unspecified dementia without behavioral disturbance: Secondary | ICD-10-CM | POA: Diagnosis present

## 2014-09-09 DIAGNOSIS — Z87442 Personal history of urinary calculi: Secondary | ICD-10-CM | POA: Diagnosis not present

## 2014-09-09 DIAGNOSIS — E039 Hypothyroidism, unspecified: Secondary | ICD-10-CM | POA: Diagnosis not present

## 2014-09-09 DIAGNOSIS — I10 Essential (primary) hypertension: Secondary | ICD-10-CM

## 2014-09-09 DIAGNOSIS — I77819 Aortic ectasia, unspecified site: Secondary | ICD-10-CM | POA: Diagnosis present

## 2014-09-09 DIAGNOSIS — Z7982 Long term (current) use of aspirin: Secondary | ICD-10-CM | POA: Diagnosis not present

## 2014-09-09 DIAGNOSIS — I129 Hypertensive chronic kidney disease with stage 1 through stage 4 chronic kidney disease, or unspecified chronic kidney disease: Secondary | ICD-10-CM | POA: Diagnosis present

## 2014-09-09 DIAGNOSIS — I351 Nonrheumatic aortic (valve) insufficiency: Secondary | ICD-10-CM | POA: Diagnosis present

## 2014-09-09 DIAGNOSIS — I519 Heart disease, unspecified: Secondary | ICD-10-CM | POA: Diagnosis not present

## 2014-09-09 DIAGNOSIS — Z950 Presence of cardiac pacemaker: Secondary | ICD-10-CM | POA: Diagnosis not present

## 2014-09-09 DIAGNOSIS — N390 Urinary tract infection, site not specified: Secondary | ICD-10-CM | POA: Diagnosis present

## 2014-09-09 DIAGNOSIS — R0789 Other chest pain: Secondary | ICD-10-CM | POA: Diagnosis present

## 2014-09-09 DIAGNOSIS — I5030 Unspecified diastolic (congestive) heart failure: Secondary | ICD-10-CM | POA: Diagnosis present

## 2014-09-09 DIAGNOSIS — E785 Hyperlipidemia, unspecified: Secondary | ICD-10-CM | POA: Diagnosis present

## 2014-09-09 DIAGNOSIS — R079 Chest pain, unspecified: Secondary | ICD-10-CM | POA: Diagnosis present

## 2014-09-09 DIAGNOSIS — Z87891 Personal history of nicotine dependence: Secondary | ICD-10-CM | POA: Diagnosis not present

## 2014-09-09 DIAGNOSIS — N182 Chronic kidney disease, stage 2 (mild): Secondary | ICD-10-CM | POA: Diagnosis not present

## 2014-09-09 LAB — TROPONIN I
Troponin I: 0.1 ng/mL — ABNORMAL HIGH (ref <0.031)
Troponin I: <0.03 ng/mL (ref <0.031)
Troponin I: <0.03 ng/mL (ref <0.031)

## 2014-09-09 LAB — URINE CULTURE

## 2014-09-09 LAB — MRSA PCR SCREENING: MRSA BY PCR: POSITIVE — AB

## 2014-09-09 MED ORDER — SODIUM CHLORIDE 0.9 % IV SOLN
1.0000 mL/kg/h | INTRAVENOUS | Status: DC
  Administered 2014-09-10: 1 mL/kg/h via INTRAVENOUS

## 2014-09-09 MED ORDER — SODIUM CHLORIDE 0.9 % IJ SOLN: 3.0000 mL | INTRAMUSCULAR | Status: DC | PRN

## 2014-09-09 MED ORDER — TECHNETIUM TC 99M SESTAMIBI GENERIC - CARDIOLITE
10.0000 | Freq: Once | INTRAVENOUS | Status: AC | PRN
  Administered 2014-09-09: 10 via INTRAVENOUS

## 2014-09-09 MED ORDER — SODIUM CHLORIDE 0.9 % IV SOLN: 250.0000 mL | INTRAVENOUS | Status: DC | PRN

## 2014-09-09 MED ORDER — NITROGLYCERIN 0.4 MG SL SUBL
SUBLINGUAL_TABLET | SUBLINGUAL | Status: AC
  Filled 2014-09-09: qty 1

## 2014-09-09 MED ORDER — MUPIROCIN 2 % EX OINT
1.0000 "application " | TOPICAL_OINTMENT | Freq: Two times a day (BID) | CUTANEOUS | Status: DC
  Administered 2014-09-09 – 2014-09-11 (×5): 1 via NASAL
  Filled 2014-09-09 (×2): qty 22

## 2014-09-09 MED ORDER — SODIUM CHLORIDE 0.9 % IJ SOLN
3.0000 mL | Freq: Two times a day (BID) | INTRAMUSCULAR | Status: DC
Start: 2014-09-09 — End: 2014-09-10
  Administered 2014-09-09 – 2014-09-10 (×2): 3 mL via INTRAVENOUS

## 2014-09-09 MED ORDER — CHLORHEXIDINE GLUCONATE CLOTH 2 % EX PADS
6.0000 | MEDICATED_PAD | Freq: Every day | CUTANEOUS | Status: DC
  Administered 2014-09-09 – 2014-09-11 (×3): 6 via TOPICAL

## 2014-09-09 MED ORDER — ASPIRIN 81 MG PO CHEW
81.0000 mg | CHEWABLE_TABLET | ORAL | Status: AC
  Administered 2014-09-10: 81 mg via ORAL
  Filled 2014-09-09: qty 1

## 2014-09-09 MED ORDER — CARVEDILOL 3.125 MG PO TABS
3.1250 mg | ORAL_TABLET | Freq: Two times a day (BID) | ORAL | Status: DC
  Administered 2014-09-09 – 2014-09-11 (×4): 3.125 mg via ORAL
  Filled 2014-09-09 (×4): qty 1

## 2014-09-09 MED ORDER — SODIUM CHLORIDE 0.9 % IJ SOLN
80.0000 mg | INTRAVENOUS | Status: AC
  Administered 2014-09-09: 80 mg via INTRAVENOUS

## 2014-09-09 MED ORDER — NITROGLYCERIN 0.4 MG SL SUBL
SUBLINGUAL_TABLET | SUBLINGUAL | Status: AC
  Administered 2014-09-09: 0.4 mg via SUBLINGUAL
  Filled 2014-09-09: qty 1

## 2014-09-09 MED ORDER — ONDANSETRON HCL 4 MG/2ML IJ SOLN
INTRAMUSCULAR | Status: AC
Start: 2014-09-09 — End: 2014-09-09
  Filled 2014-09-09: qty 2

## 2014-09-09 MED ORDER — REGADENOSON 0.4 MG/5ML IV SOLN
INTRAVENOUS | Status: AC
Start: 2014-09-09 — End: 2014-09-09
  Administered 2014-09-09: 0.4 mg
  Filled 2014-09-09: qty 5

## 2014-09-09 MED ORDER — TECHNETIUM TC 99M SESTAMIBI GENERIC - CARDIOLITE
30.0000 | Freq: Once | INTRAVENOUS | Status: AC | PRN
  Administered 2014-09-09: 30 via INTRAVENOUS

## 2014-09-09 NOTE — Progress Notes (Signed)
Subjective: Complains of 8/10 pain this morning in the left ribs mid axillary line.  Objective: Vital signs in last 24 hours: Temp:  [97.4 F (36.3 C)-98.2 F (36.8 C)] 98.2 F (36.8 C) (03/28 0811) Pulse Rate:  [59-66] 62 (03/28 0811) Resp:  [14-22] 18 (03/28 0811) BP: (87-174)/(46-90) 174/69 mmHg (03/28 1015) SpO2:  [95 %-100 %] 99 % (03/28 0811) Weight:  [220 lb (99.791 kg)-221 lb 14.4 oz (100.653 kg)] 221 lb 14.4 oz (100.653 kg) (03/28 0246) Last BM Date: 09/09/14  Intake/Output from previous day: 03/27 0701 - 03/28 0700 In: -  Out: 1100 [Urine:1100] Intake/Output this shift:    Medications Current Facility-Administered Medications  Medication Dose Route Frequency Provider Last Rate Last Dose  . 0.9 %  sodium chloride infusion   Intravenous Continuous Nishant Dhungel, MD 75 mL/hr at 09/08/14 2242    . acetaminophen (TYLENOL) tablet 650 mg  650 mg Oral Q4H PRN Nishant Dhungel, MD      . albuterol (PROVENTIL) (2.5 MG/3ML) 0.083% nebulizer solution 2.5 mg  2.5 mg Nebulization Q6H PRN Nishant Dhungel, MD      . alum & mag hydroxide-simeth (MAALOX/MYLANTA) 200-200-20 MG/5ML suspension 30 mL  30 mL Oral Q6H PRN Nishant Dhungel, MD      . aspirin chewable tablet 81 mg  81 mg Oral Daily Nishant Dhungel, MD      . atorvastatin (LIPITOR) tablet 10 mg  10 mg Oral q1800 Nishant Dhungel, MD      . Chlorhexidine Gluconate Cloth 2 % PADS 6 each  6 each Topical Q0600 Meredeth Ide, MD      . donepezil (ARICEPT) tablet 10 mg  10 mg Oral Daily Nishant Dhungel, MD   10 mg at 09/08/14 2234  . enoxaparin (LOVENOX) injection 40 mg  40 mg Subcutaneous Q24H Nishant Dhungel, MD   40 mg at 09/08/14 2243  . famotidine (PEPCID) tablet 20 mg  20 mg Oral BID Nishant Dhungel, MD   20 mg at 09/08/14 2234  . fluticasone (FLONASE) 50 MCG/ACT nasal spray 2 spray  2 spray Each Nare Daily Nishant Dhungel, MD      . levothyroxine (SYNTHROID, LEVOTHROID) tablet 50 mcg  50 mcg Oral QAC breakfast Nishant  Dhungel, MD   50 mcg at 09/09/14 0743  . loratadine (CLARITIN) tablet 10 mg  10 mg Oral Daily Nishant Dhungel, MD   10 mg at 09/08/14 2234  . meclizine (ANTIVERT) tablet 25 mg  25 mg Oral TID PRN Nishant Dhungel, MD      . meloxicam (MOBIC) tablet 7.5 mg  7.5 mg Oral q morning - 10a Nishant Dhungel, MD      . mupirocin ointment (BACTROBAN) 2 % 1 application  1 application Nasal BID Meredeth Ide, MD      . naproxen (NAPROSYN) tablet 250 mg  250 mg Oral BID PRN Nishant Dhungel, MD      . nitroGLYCERIN (NITROSTAT) 0.4 MG SL tablet           . nitroGLYCERIN (NITROSTAT) SL tablet 0.4 mg  0.4 mg Sublingual Q5 min PRN Nishant Dhungel, MD   0.4 mg at 09/09/14 1039  . ondansetron (ZOFRAN) injection 4 mg  4 mg Intravenous Q6H PRN Nishant Dhungel, MD      . sodium chloride (OCEAN) 0.65 % nasal spray 1 spray  1 spray Each Nare BID PRN Nishant Dhungel, MD      . tiZANidine (ZANAFLEX) tablet 4 mg  4 mg Oral TID Eddie North, MD  4 mg at 09/08/14 2234    PE: General appearance: alert, cooperative and no distress Lungs: clear to auscultation bilaterally Heart: regular rate and rhythm, S1, S2 normal, no murmur, click, rub or gallop Chest wall:  Tenderness exacerbated with palpation in the left, mid axillary line Abdomen: +BS, Nontender. Extremities: No LEE Pulses: 2+ and symmetric Skin: Warm and dry. Neurologic: Grossly normal  Lab Results:   Recent Labs  09/08/14 1459  WBC 6.5  HGB 12.5*  HCT 37.2*  PLT 155   BMET  Recent Labs  09/08/14 1459  NA 140  K 4.3  CL 108  CO2 25  GLUCOSE 119*  BUN 15  CREATININE 1.19  CALCIUM 8.8    Assessment/Plan    Principal Problem:   Chest pain Active Problems:   Essential hypertension   S/P placement of cardiac pacemaker   Moderate dementia   Diastolic dysfunction   CKD (chronic kidney disease), stage II   Chest pain at rest   Troponin elevation, 0.10  Plan troponin increased to 0.10 then normal again.  Continues to have 8/10 left  axillary pain that is not nitro responsive.  No acute changes on the EKG.  No prior CAD history but does have significant tobacco history. PPM for symptomatic bradycardia.   He developed nausea and SOB during the lexiscan.  Aminophylline and zofran given with improvement.  Interpretation to follow.   Wilburt FinlayHAGER, Marsheila Alejo PA-C 09/09/2014 10:40 AM

## 2014-09-09 NOTE — Consult Note (Signed)
Cardiologist:   Reason for Consult: Positive nuclear stress test Referring Physician:   [name] is an 74 y.o. male.   HPI: A 74 year old male with a history of HTN, HL, symptomatic sinus bradycardia s/p pacemaker, tobacco abuse, CKD stage II, aortic regurgitation and hypothyroidism.  Stress test on 02-19-13 which revealed reverse redistribution of the inferior wall, indicative of soft tissue attenuation. LVEF was 59%. Pacemaker placement 03/19/2013. Echo 03/29/2013 showed EF 60%.  He was presented to the ED with acute onset chest pain yesterday. Patient reports being in the bathroom when he had left-sided chest pain which was localized, sharp and lasting for about 1-2 minutes, 8/10 in severity and self subsided. The pain was nonradiating and not associated with any palpitations or shortness of breath. He reports the pain again recurred several minutes later at the same area. Denies similar symptoms in the past. Patient denies headache, dizziness, fever, chills, nausea , vomiting, abdominal pain, bowel or urinary symptoms. Denies change in weight or appetite. At baseline he is able to ambulate good distance. EKG showed sinus rhythm without any significant change. Chest x-ray was unremarkable. He was admitted and lexiscan myoview obtained revealed inferoapical and apical lateral perfusion defect and EF of 65%. He had a left axilla and chest pain during lexiscan. He was given nitro. He denies any CP or ribs pain on floor.   Past Medical History  Diagnosis Date  . Near syncope   . Sinus bradycardia   . Diastolic murmur   . Hypertension   . Renal insufficiency   . History of tobacco abuse   . Syncope   . Dementia   . Seborrheic dermatitis   . Hyperlipidemia     Past Surgical History  Procedure Laterality Date  . Pacemaker insertion  03/19/13    SJM pacemaker implanted for symptomatic sinus bradycardia by Dr Cody Vincent  . Kidney stone surgery  06/2013  . Multiple tooth extractions       Full Dentures  . Permanent pacemaker insertion N/A 03/19/2013    Procedure: PERMANENT PACEMAKER INSERTION;  Surgeon: Cody Vincent , MD;  Location: [facility];  Service: Cardiovascular;  Laterality: N/A;    Family History  Problem Relation Age of Onset  . Heart disease Father     Deceased-90s  . Cancer Father   . Stroke Mother 38    Deceased  . Heart disease Mother   . Dementia Mother   . Healthy Brother     x1  . Hypertension Daughter     x1    Social History:  reports that he quit smoking about 18 years ago. His smoking use included Cigarettes. He has a 117 pack-year smoking history. He has never used smokeless tobacco. He reports that he does not drink alcohol or use illicit drugs.  Allergies: No Known Allergies  Medications:  Scheduled Meds: . aspirin  81 mg Oral Daily  . atorvastatin  10 mg Oral q1800  . Chlorhexidine Gluconate Cloth  6 each Topical Q0600  . donepezil  10 mg Oral Daily  . enoxaparin (LOVENOX) injection  40 mg Subcutaneous Q24H  . famotidine  20 mg Oral BID  . fluticasone  2 spray Each Nare Daily  . levothyroxine  50 mcg Oral QAC breakfast  . loratadine  10 mg Oral Daily  . meloxicam  7.5 mg Oral q morning - 10a  . mupirocin ointment  1 application Nasal BID  . tiZANidine  4 mg Oral TID  Continuous Infusions: . sodium chloride 75 mL/hr at 09/09/14 1228   PRN Meds:.acetaminophen, albuterol, alum & mag hydroxide-simeth, meclizine, naproxen, nitroGLYCERIN, ondansetron (ZOFRAN) IV, sodium chloride   Results for orders placed or performed during the hospital encounter of 09/08/14 (from the past 48 hour(s))  Comprehensive metabolic panel     Status: Abnormal   Collection Time: 09/08/14  2:59 PM  Result Value Ref Range   Sodium 140 135 - 145 mmol/L   Potassium 4.3 3.5 - 5.1 mmol/L   Chloride 108 96 - 112 mmol/L   CO2 25 19 - 32 mmol/L   Glucose, Bld 119 (H) 70 - 99 mg/dL   BUN 15 6 - 23 mg/dL   Creatinine, Ser 1.19 0.50 - 1.35 mg/dL   Calcium  8.8 8.4 - 10.5 mg/dL   Total Protein 5.8 (L) 6.0 - 8.3 g/dL   Albumin 3.4 (L) 3.5 - 5.2 g/dL   AST 23 0 - 37 U/L   ALT 15 0 - 53 U/L   Alkaline Phosphatase 100 39 - 117 U/L   Total Bilirubin 0.7 0.3 - 1.2 mg/dL   GFR calc non Af Amer 60 (L) >90 mL/min   GFR calc Af Amer 69 (L) >90 mL/min    Comment: (NOTE) The eGFR has been calculated using the CKD EPI equation. This calculation has not been validated in all clinical situations. eGFR's persistently <90 mL/min signify possible Chronic Kidney Disease.    Anion gap 7 5 - 15  Lipase, blood     Status: None   Collection Time: 09/08/14  2:59 PM  Result Value Ref Range   Lipase 33 11 - 59 U/L  CBC with Differential     Status: Abnormal   Collection Time: 09/08/14  2:59 PM  Result Value Ref Range   WBC 6.5 4.0 - 10.5 K/uL   RBC 4.21 (L) 4.22 - 5.81 MIL/uL   Hemoglobin 12.5 (L) 13.0 - 17.0 g/dL   HCT 37.2 (L) 39.0 - 52.0 %   MCV 88.4 78.0 - 100.0 fL   MCH 29.7 26.0 - 34.0 pg   MCHC 33.6 30.0 - 36.0 g/dL   RDW 13.2 11.5 - 15.5 %   Platelets 155 150 - 400 K/uL   Neutrophils Relative % 64 43 - 77 %   Neutro Abs 4.2 1.7 - 7.7 K/uL   Lymphocytes Relative 19 12 - 46 %   Lymphs Abs 1.3 0.7 - 4.0 K/uL   Monocytes Relative 12 3 - 12 %   Monocytes Absolute 0.8 0.1 - 1.0 K/uL   Eosinophils Relative 4 0 - 5 %   Eosinophils Absolute 0.2 0.0 - 0.7 K/uL   Basophils Relative 1 0 - 1 %   Basophils Absolute 0.0 0.0 - 0.1 K/uL  Troponin I     Status: None   Collection Time: 09/08/14  2:59 PM  Result Value Ref Range   Troponin I <0.03 <0.031 ng/mL    Comment:        NO INDICATION OF MYOCARDIAL INJURY.   Brain natriuretic peptide     Status: None   Collection Time: 09/08/14  2:59 PM  Result Value Ref Range   B Natriuretic Peptide 21.1 0.0 - 100.0 pg/mL  D-dimer, quantitative     Status: None   Collection Time: 09/08/14  2:59 PM  Result Value Ref Range   D-Dimer, Quant 0.43 0.00 - 0.48 ug/mL-FEU    Comment:        AT THE INHOUSE  ESTABLISHED CUTOFF  VALUE OF 0.48 ug/mL FEU, THIS ASSAY HAS BEEN DOCUMENTED IN THE LITERATURE TO HAVE A SENSITIVITY AND NEGATIVE PREDICTIVE VALUE OF AT LEAST 98 TO 99%.  THE TEST RESULT SHOULD BE CORRELATED WITH AN ASSESSMENT OF THE CLINICAL PROBABILITY OF DVT / VTE.   Urinalysis, Routine w reflex microscopic     Status: Abnormal   Collection Time: 09/08/14  3:08 PM  Result Value Ref Range   Color, Urine YELLOW YELLOW   APPearance CLOUDY (A) CLEAR   Specific Gravity, Urine 1.008 1.005 - 1.030   pH 6.0 5.0 - 8.0   Glucose, UA NEGATIVE NEGATIVE mg/dL   Hgb urine dipstick SMALL (A) NEGATIVE   Bilirubin Urine NEGATIVE NEGATIVE   Ketones, ur NEGATIVE NEGATIVE mg/dL   Protein, ur NEGATIVE NEGATIVE mg/dL   Urobilinogen, UA 0.2 0.0 - 1.0 mg/dL   Nitrite NEGATIVE NEGATIVE   Leukocytes, UA LARGE (A) NEGATIVE  Urine microscopic-add on     Status: Abnormal   Collection Time: 09/08/14  3:08 PM  Result Value Ref Range   WBC, UA TOO NUMEROUS TO COUNT <3 WBC/hpf   Bacteria, UA MANY (A) RARE  MRSA PCR Screening     Status: Abnormal   Collection Time: 09/08/14  5:47 PM  Result Value Ref Range   MRSA by PCR POSITIVE (A) NEGATIVE    Comment:        The GeneXpert MRSA Assay (FDA approved for NASAL specimens only), is one component of a comprehensive MRSA colonization surveillance program. It is not intended to diagnose MRSA infection nor to guide or monitor treatment for MRSA infections. RESULT CALLED TO, READ BACK BY AND VERIFIED WITH: COOPER,S RN [phone] SKEEN,P   Troponin I     Status: None   Collection Time: 09/08/14  7:00 PM  Result Value Ref Range   Troponin I <0.03 <0.031 ng/mL    Comment:        NO INDICATION OF MYOCARDIAL INJURY.   Troponin I     Status: Abnormal   Collection Time: 09/09/14 12:23 AM  Result Value Ref Range   Troponin I 0.10 (H) <0.031 ng/mL    Comment:        PERSISTENTLY INCREASED TROPONIN VALUES IN THE RANGE OF 0.04-0.49 ng/mL CAN BE SEEN IN:        -UNSTABLE ANGINA       -CONGESTIVE HEART FAILURE       -MYOCARDITIS       -CHEST TRAUMA       -ARRYHTHMIAS       -LATE PRESENTING MYOCARDIAL INFARCTION       -COPD   CLINICAL FOLLOW-UP RECOMMENDED.   Troponin I     Status: None   Collection Time: 09/09/14  6:35 AM  Result Value Ref Range   Troponin I <0.03 <0.031 ng/mL    Comment:        NO INDICATION OF MYOCARDIAL INJURY.     Dg Chest 2 View  09/08/2014   CLINICAL DATA:  Shortness of Breath  EXAM: CHEST  2 VIEW  COMPARISON:  08/10/2014  FINDINGS: Cardiomediastinal silhouette is stable. Dual lead cardiac pacemaker is unchanged in position. No acute infiltrate or pleural effusion. No pulmonary edema. Mild degenerative changes thoracic spine.  IMPRESSION: No active cardiopulmonary disease.   Electronically Signed   By: Lahoma Crocker M.D.   On: 09/08/2014 14:48   Nm Myocar Multi W/spect W/wall Motion / Ef  09/09/2014   HISTORY OF PRESENT ILLNESS: Nuclear Med Background  Indication for Stress  Test:  Chest pain  History: 74 yo male with complaints of chest pain, history of PPM, smoker, HTN and mildly positive troponin.  Cardiac Risk Factors:  HTN, smoker, PPM  Symptoms:  Chest pain  PROCEDURE: Nuclear Pre-Procedure  Caffeine/Decaff Intake:  NPO After: MN.  Lungs:  O2 Stat:  IV 0.9% NS with Angio Cath:  Chest Size (in):  Cup Size:  Height: 5'8"  Weight:  221 lb.  BMI:  Tech Comments:  Nuclear Med Study  1 or 2 day study: 1  Stress Test Type: Lexiscan  Reading MD: Hilty  Order Authorizing Provider: Dhungel  Resting Radionuclide: Sestamibi  Resting Radionuclide Dose:  10 mCi  Stress Radionuclide: Sestamibi  Stress Radionuclide Dose:  30 mCi  Stress Protocol  Rest HR: 61  Stress HR: 63  Rest BP: 133/67  Stress BP: 121/54  Exercise Time (min):  METS:  Dose of Adenosine (mg):  Dose of Lexiscan:  0.4 mg  Dose of Atropine (mg):  Dose of Dobutamine:  Stress Test Technologist:  Nuclear Technologist:  Rest Procedure:  Stress Procedure:  Transient Ischemic  Dilatation (Normal <1.22): 0.92  Lung/Heart Ratio (Normal <0.45):  QGS EDV:  104 ml  QGS ESV:  36 ml  LV Ejection Fraction: 65%  Rest ECG:  NSR with lateral TWI  Stress ECG:  No changes  Raw Data Images:  No significant artifact  Stress Images:  Inferoapical and apical lateral pefusion defect  Rest Images:  Mild lateral perfusion defect  Subtraction (SDS): 4  IMPRESSION: Exercise Capacity: N/A  BP Response: normal  Clinical Symptoms:  Nausea, chills, dyspnea  ECG Impression:  No lexiscan changes  Comparison with Prior Nuclear Study: None  Final Impression:  Intermediate risk study. Moderate sized and intensity, partially reversible inferoapical and apical lateral perfusion defect suggestive of ischemia (possibly distal LCX or OM vessel). LVEF 65%, normal wall motion.   Electronically Signed   By: Pixie Casino   On: 09/09/2014 14:26    Review of Systems  Constitutional: Negative for fever and chills.  Respiratory: Negative for cough, hemoptysis and shortness of breath.   Cardiovascular: Positive for chest pain. Negative for palpitations, orthopnea, claudication, leg swelling and PND.  Gastrointestinal: Negative for nausea, vomiting, abdominal pain, blood in stool and melena.  Genitourinary: Positive for hematuria.  Musculoskeletal: Positive for back pain.  All other systems reviewed and are negative.  Blood pressure 121/54, pulse 62, temperature 98.2 F (36.8 C), temperature source Oral, resp. rate 18, height _0  (1.727 m), weight 221 lb 14.4 oz (100.653 kg), SpO2 99 %. Physical Exam  Nursing note and vitals reviewed. Constitutional: He appears well-developed and well-nourished. No distress.  HENT:  Head: Normocephalic and atraumatic.  Eyes: EOM are normal. Pupils are equal, round, and reactive to light. No scleral icterus.  Neck: Normal range of motion.  Cardiovascular: Normal rate, regular rhythm, S1 normal, S2 normal and intact distal pulses.  PMI is not displaced.  Exam reveals no  distant heart sounds.   No murmur heard. Pulses:      Radial pulses are 2+ on the right side, and 2+ on the left side.       Dorsalis pedis pulses are 2+ on the right side, and 2+ on the left side.       Posterior tibial pulses are 2+ on the right side, and 2+ on the left side.  No carotid bruit.  Respiratory: Effort normal and breath sounds normal. No respiratory distress. He has no wheezes.  GI:  Soft. Bowel sounds are normal. There is no tenderness.  Neurological: He is alert.  Skin: Skin is warm and dry.  Psychiatric: He has a normal mood and affect.    Assessment/Plan:  Chest pain:  troponin midely elevated 0.10. S/p lexiscan myoview obtained revealed inferoapical and apical lateral     perfusion defect and EF of 65%. Schedule for cath tomorrow. Patient agree. Nitro PRN. Consider adding low dose carvedilol 3.6m    Essential hypertension:  Hold medicine considering low normal diastolic BP. Gentle hydration.   S/P placement of cardiac pacemaker    Moderate dementia:  Per IM   Diastolic dysfunction:  BP managment   CKD (chronic kidney disease), stage II: Creatinine 1.19.Gentle hydration.     HTarri Fuller PA-C 09/09/2014, 2:48 PM     Personally seen and examined. Agree with above. Exam unremarkable, RRR, CTAB. Normal radial pulse.  Intermediate risk NUC. Cath in AM.  Pacer - Beta blocker low dose IVF prior to cath.   SCandee Furbish MD

## 2014-09-09 NOTE — Progress Notes (Signed)
Called [facility] and spoke with pt's nurse - informed him of pt's chest pain/nausea - that we gave SL Nitro X 2, IV Zofran and  IV Aminophylline to reverse effects of Lexiscan stress test.  (See MAR) - symptoms  have subsided now.

## 2014-09-09 NOTE — Progress Notes (Signed)
[facility] PROGRESS NOTE  Minna Antiserry L Bedingfield RUE:[id number]:[id number] DOB: 02-May-1941 DOA: 09/08/2014 PCP: Koren BoundMATRONE, ANDREW, NP  Assessment/Plan: 1. Chest pain- patient admitted with chest pain had second troponin elevated at 2.10 and throat came back normal. This morning cardiac stress test was done, shows intermediate risk study. Cardiology will make further recommendations. 2. Hypothyroidism- continue Synthroid 3. Dementia-stable, continue Aricept 4. Hyperlipidemia-stable, continue Lipitor 5. Status post pacemaker placement- has history of some intermittent bradycardia, followed by Dr. Johney FrameAllred.  Code Status: Full code Family Communication: *No family at bedside Disposition Plan: Home when stable   Consultants:  Cardiology  Procedures:  Nuclear Myoview stress test  Antibiotics:  None  HPI/Subjective: 74 year old male with a history of hypertension, dyslipidemia status post pacemaker placement for symptom and bradycardia: Noted dementia, moderate aortic insufficiency and came with chest pain yesterday. Patient today underwent nuclear myocardial stress test which shows intermediate risk study.  Objective: Filed Vitals:   09/09/14 1058  BP: 121/54  Pulse:   Temp:   Resp:     Intake/Output Summary (Last 24 hours) at 09/09/14 1529 Last data filed at 09/09/14 1100  Gross per 24 hour  Intake      0 ml  Output   1450 ml  Net  -1450 ml   Filed Weights   09/08/14 1402 09/09/14 0246  Weight: 99.791 kg (220 lb) 100.653 kg (221 lb 14.4 oz)    Exam:   General:  Appears in no acute distress  Cardiovascular: S1-S2 regular  Respiratory: Clear to auscultation bilaterally  Abdomen: Soft, nontender, no organomegaly  Musculoskeletal: No edema of lower extremities noted   Data Reviewed: Basic Metabolic Panel:  Recent Labs Lab 09/08/14 1459  NA 140  K 4.3  CL 108  CO2 25  GLUCOSE 119*  BUN 15  CREATININE 1.19  CALCIUM 8.8   Liver Function Tests:  Recent Labs Lab  09/08/14 1459  AST 23  ALT 15  ALKPHOS 100  BILITOT 0.7  PROT 5.8*  ALBUMIN 3.4*    Recent Labs Lab 09/08/14 1459  LIPASE 33   No results for input(s): AMMONIA in the last 168 hours. CBC:  Recent Labs Lab 09/08/14 1459  WBC 6.5  NEUTROABS 4.2  HGB 12.5*  HCT 37.2*  MCV 88.4  PLT 155   Cardiac Enzymes:  Recent Labs Lab 09/08/14 1459 09/08/14 1900 09/09/14 0023 09/09/14 0635  TROPONINI <0.03 <0.03 0.10* <0.03   BNP (last 3 results)  Recent Labs  07/26/14 1315 09/08/14 1459  BNP 18.3 21.1    ProBNP (last 3 results) No results for input(s): PROBNP in the last 8760 hours.  CBG: No results for input(s): GLUCAP in the last 168 hours.  Recent Results (from the past 240 hour(s))  MRSA PCR Screening     Status: Abnormal   Collection Time: 09/08/14  5:47 PM  Result Value Ref Range Status   MRSA by PCR POSITIVE (A) NEGATIVE Final    Comment:        The GeneXpert MRSA Assay (FDA approved for NASAL specimens only), is one component of a comprehensive MRSA colonization surveillance program. It is not intended to diagnose MRSA infection nor to guide or monitor treatment for MRSA infections. RESULT CALLED TO, READ BACK BY AND VERIFIED WITH: COOPER,S RN [phone] SKEEN,P      Studies: Dg Chest 2 View  09/08/2014   CLINICAL DATA:  Shortness of Breath  EXAM: CHEST  2 VIEW  COMPARISON:  08/10/2014  FINDINGS: Cardiomediastinal silhouette is stable. Dual lead  cardiac pacemaker is unchanged in position. No acute infiltrate or pleural effusion. No pulmonary edema. Mild degenerative changes thoracic spine.  IMPRESSION: No active cardiopulmonary disease.   Electronically Signed   By: Natasha Mead M.D.   On: 09/08/2014 14:48   Nm Myocar Multi W/spect W/wall Motion / Ef  09/09/2014   HISTORY OF PRESENT ILLNESS: Nuclear Med Background  Indication for Stress Test:  Chest pain  History: 74 yo male with complaints of chest pain, history of PPM, smoker, HTN and mildly  positive troponin.  Cardiac Risk Factors:  HTN, smoker, PPM  Symptoms:  Chest pain  PROCEDURE: Nuclear Pre-Procedure  Caffeine/Decaff Intake:  NPO After: MN.  Lungs:  O2 Stat:  IV 0.9% NS with Angio Cath:  Chest Size (in):  Cup Size:  Height: 5'8"  Weight:  221 lb.  BMI:  Tech Comments:  Nuclear Med Study  1 or 2 day study: 1  Stress Test Type: Lexiscan  Reading MD: Hilty  Order Authorizing Provider: Dhungel  Resting Radionuclide: Sestamibi  Resting Radionuclide Dose:  10 mCi  Stress Radionuclide: Sestamibi  Stress Radionuclide Dose:  30 mCi  Stress Protocol  Rest HR: 61  Stress HR: 63  Rest BP: 133/67  Stress BP: 121/54  Exercise Time (min):  METS:  Dose of Adenosine (mg):  Dose of Lexiscan:  0.4 mg  Dose of Atropine (mg):  Dose of Dobutamine:  Stress Test Technologist:  Nuclear Technologist:  Rest Procedure:  Stress Procedure:  Transient Ischemic Dilatation (Normal <1.22): 0.92  Lung/Heart Ratio (Normal <0.45):  QGS EDV:  104 ml  QGS ESV:  36 ml  LV Ejection Fraction: 65%  Rest ECG:  NSR with lateral TWI  Stress ECG:  No changes  Raw Data Images:  No significant artifact  Stress Images:  Inferoapical and apical lateral pefusion defect  Rest Images:  Mild lateral perfusion defect  Subtraction (SDS): 4  IMPRESSION: Exercise Capacity: N/A  BP Response: normal  Clinical Symptoms:  Nausea, chills, dyspnea  ECG Impression:  No lexiscan changes  Comparison with Prior Nuclear Study: None  Final Impression:  Intermediate risk study. Moderate sized and intensity, partially reversible inferoapical and apical lateral perfusion defect suggestive of ischemia (possibly distal LCX or OM vessel). LVEF 65%, normal wall motion.   Electronically Signed   By: Chrystie Nose   On: 09/09/2014 14:26    Scheduled Meds: . aspirin  81 mg Oral Daily  . atorvastatin  10 mg Oral q1800  . Chlorhexidine Gluconate Cloth  6 each Topical Q0600  . donepezil  10 mg Oral Daily  . enoxaparin (LOVENOX) injection  40 mg Subcutaneous Q24H  .  famotidine  20 mg Oral BID  . fluticasone  2 spray Each Nare Daily  . levothyroxine  50 mcg Oral QAC breakfast  . loratadine  10 mg Oral Daily  . meloxicam  7.5 mg Oral q morning - 10a  . mupirocin ointment  1 application Nasal BID  . tiZANidine  4 mg Oral TID   Continuous Infusions: . sodium chloride 75 mL/hr at 09/09/14 1228    Principal Problem:   Chest pain Active Problems:   Essential hypertension   S/P placement of cardiac pacemaker   Moderate dementia   Diastolic dysfunction   CKD (chronic kidney disease), stage II   Chest pain at rest    Time spent: 20 min*    [facility] S  [facility] Pager [phone]. If 7PM-7AM, please contact night-coverage at [email or web address], password [facility] 09/09/2014, 3:29 PM

## 2014-09-09 NOTE — Progress Notes (Signed)
UR completed 

## 2014-09-09 NOTE — Progress Notes (Addendum)
[facility] progress note. Chief complaint. Elevated troponin. History of present illness. This 74 year old male admitted through the emergency room with complaints of chest pain. Patient was noted with the atypical symptoms and underlying dementia thus a poor historian. Case was discussed with cardiology and plan is to proceed with Lexus scan Myoview in a.m. Troponins have been cycled and the first 2 troponins were negative. The third troponin has now resulted and is elevated 0.1. Came to see the patient at bedside and obtained a repeat EKG. Patient is in no distress and EKG suggest a paced rhythm. Vital signs. Temperature 97.7, pulse 60, respiration 20, blood pressure 104/53. O2 sats 97%. General appearance. Frail appearing elderly male who is alert and in no distress. Cardiac. Rate and rhythm regular. Lungs. Breath sounds clear and equal. Abdomen. Soft with positive bowel sounds. Impression/plan. Problem #1. Elevated troponin. Patient chest pain-free. Subsequent EKG indicates a paced rhythm. Will cycle an additional 3 troponins every 6 hours starting at noon today. I did discuss the case with on-call cardiology Dr. Gladstone LighterSivac who agrees with cycling further troponins. He requests that we contact him if troponins continue to trend up.

## 2014-09-10 ENCOUNTER — Encounter (HOSPITAL_COMMUNITY)
Admission: EM | Disposition: A | Discharge status: Home / Self Care | Payer: Self-pay | Source: Home / Self Care | Attending: Family Medicine

## 2014-09-10 DIAGNOSIS — R079 Chest pain, unspecified: Secondary | ICD-10-CM | POA: Insufficient documentation

## 2014-09-10 DIAGNOSIS — R0789 Other chest pain: Principal | ICD-10-CM

## 2014-09-10 DIAGNOSIS — E039 Hypothyroidism, unspecified: Secondary | ICD-10-CM

## 2014-09-10 HISTORY — PX: LEFT HEART CATHETERIZATION WITH CORONARY ANGIOGRAM: SHX5451

## 2014-09-10 LAB — BASIC METABOLIC PANEL
ANION GAP: 3 — AB (ref 5–15)
BUN: 15 mg/dL (ref 6–23)
CO2: 27 mmol/L (ref 19–32)
Calcium: 8.3 mg/dL — ABNORMAL LOW (ref 8.4–10.5)
Chloride: 109 mmol/L (ref 96–112)
Creatinine, Ser: 1.3 mg/dL (ref 0.50–1.35)
GFR calc Af Amer: 62 mL/min — ABNORMAL LOW (ref >90)
GFR calc non Af Amer: 54 mL/min — ABNORMAL LOW (ref >90)
Glucose, Bld: 126 mg/dL — ABNORMAL HIGH (ref 70–99)
Potassium: 4.1 mmol/L (ref 3.5–5.1)
Sodium: 139 mmol/L (ref 135–145)

## 2014-09-10 LAB — PROTIME-INR
INR: 1.22 (ref 0.00–1.49)
Prothrombin Time: 15.6 seconds — ABNORMAL HIGH (ref 11.6–15.2)

## 2014-09-10 LAB — TROPONIN I

## 2014-09-10 SURGERY — LEFT HEART CATHETERIZATION WITH CORONARY ANGIOGRAM

## 2014-09-10 MED ORDER — LIDOCAINE HCL (PF) 1 % IJ SOLN
INTRAMUSCULAR | Status: AC
  Filled 2014-09-10: qty 30

## 2014-09-10 MED ORDER — FENTANYL CITRATE 0.05 MG/ML IJ SOLN
INTRAMUSCULAR | Status: AC
  Filled 2014-09-10: qty 2

## 2014-09-10 MED ORDER — OXYCODONE-ACETAMINOPHEN 5-325 MG PO TABS: 1.0000 | ORAL_TABLET | ORAL | Status: DC | PRN

## 2014-09-10 MED ORDER — ENOXAPARIN SODIUM 40 MG/0.4ML ~~LOC~~ SOLN
40.0000 mg | SUBCUTANEOUS | Status: DC
  Administered 2014-09-11: 40 mg via SUBCUTANEOUS
  Filled 2014-09-10: qty 0.4

## 2014-09-10 MED ORDER — NITROGLYCERIN 1 MG/10 ML FOR IR/CATH LAB
INTRA_ARTERIAL | Status: AC
  Filled 2014-09-10: qty 10

## 2014-09-10 MED ORDER — MIDAZOLAM HCL 2 MG/2ML IJ SOLN
INTRAMUSCULAR | Status: AC
  Filled 2014-09-10: qty 2

## 2014-09-10 MED ORDER — ACETAMINOPHEN 325 MG PO TABS: 650.0000 mg | ORAL_TABLET | Freq: Four times a day (QID) | ORAL | Status: DC | PRN

## 2014-09-10 MED ORDER — HEPARIN SODIUM (PORCINE) 1000 UNIT/ML IJ SOLN
INTRAMUSCULAR | Status: AC
  Filled 2014-09-10: qty 1

## 2014-09-10 MED ORDER — VERAPAMIL HCL 2.5 MG/ML IV SOLN
INTRAVENOUS | Status: AC
  Filled 2014-09-10: qty 2

## 2014-09-10 MED ORDER — SODIUM CHLORIDE 0.9 % IV SOLN
INTRAVENOUS | Status: AC
  Administered 2014-09-10: 16:00:00 via INTRAVENOUS

## 2014-09-10 MED ORDER — HEPARIN (PORCINE) IN NACL 2-0.9 UNIT/ML-% IJ SOLN
INTRAMUSCULAR | Status: AC
  Filled 2014-09-10: qty 1500

## 2014-09-10 NOTE — Progress Notes (Signed)
 TELEMETRY: Reviewed telemetry pt in NSR: Filed Vitals:   09/09/14 1640 09/09/14 1700 09/09/14 2037 09/10/14 0540  BP: 116/50  96/53 125/61  Pulse: 63  66 64  Temp:  98 F (36.7 C) 97.9 F (36.6 C) 97.9 F (36.6 C)  TempSrc:  Oral Oral Oral  Resp:   18 16  Height:      Weight:    222 lb 14.4 oz (101.107 kg)  SpO2:    97%    Intake/Output Summary (Last 24 hours) at 09/10/14 1024 Last data filed at 09/09/14 1100  Gross per 24 hour  Intake      0 ml  Output    350 ml  Net   -350 ml   Filed Weights   09/08/14 1402 09/09/14 0246 09/10/14 0540  Weight: 220 lb (99.791 kg) 221 lb 14.4 oz (100.653 kg) 222 lb 14.4 oz (101.107 kg)    Subjective Denies any chest pain this am.  . aspirin  81 mg Oral Daily  . atorvastatin  10 mg Oral q1800  . carvedilol  3.125 mg Oral BID WC  . Chlorhexidine Gluconate Cloth  6 each Topical Q0600  . donepezil  10 mg Oral Daily  . enoxaparin (LOVENOX) injection  40 mg Subcutaneous Q24H  . famotidine  20 mg Oral BID  . fluticasone  2 spray Each Nare Daily  . levothyroxine  50 mcg Oral QAC breakfast  . loratadine  10 mg Oral Daily  . meloxicam  7.5 mg Oral q morning - 10a  . mupirocin ointment  1 application Nasal BID  . sodium chloride  3 mL Intravenous Q12H  . tiZANidine  4 mg Oral TID   . sodium chloride 1 mL/kg/hr (09/10/14 0505)    LABS: Basic Metabolic Panel:  Recent Labs  09/08/14 1459 09/10/14 0042  NA 140 139  K 4.3 4.1  CL 108 109  CO2 25 27  GLUCOSE 119* 126*  BUN 15 15  CREATININE 1.19 1.30  CALCIUM 8.8 8.3*   Liver Function Tests:  Recent Labs  09/08/14 1459  AST 23  ALT 15  ALKPHOS 100  BILITOT 0.7  PROT 5.8*  ALBUMIN 3.4*    Recent Labs  09/08/14 1459  LIPASE 33   CBC:  Recent Labs  09/08/14 1459  WBC 6.5  NEUTROABS 4.2  HGB 12.5*  HCT 37.2*  MCV 88.4  PLT 155   Cardiac Enzymes:  Recent Labs  09/09/14 1410 09/09/14 1900 09/10/14 0042  TROPONINI <0.03 <0.03 <0.03   BNP: No results  for input(s): PROBNP in the last 72 hours. D-Dimer:  Recent Labs  09/08/14 1459  DDIMER 0.43   Hemoglobin A1C: No results for input(s): HGBA1C in the last 72 hours. Fasting Lipid Panel: No results for input(s): CHOL, HDL, LDLCALC, TRIG, CHOLHDL, LDLDIRECT in the last 72 hours. Thyroid Function Tests: No results for input(s): TSH, T4TOTAL, T3FREE, THYROIDAB in the last 72 hours.  Invalid input(s): FREET3   Radiology/Studies:  Dg Chest 2 View  09/08/2014   CLINICAL DATA:  Shortness of Breath  EXAM: CHEST  2 VIEW  COMPARISON:  08/10/2014  FINDINGS: Cardiomediastinal silhouette is stable. Dual lead cardiac pacemaker is unchanged in position. No acute infiltrate or pleural effusion. No pulmonary edema. Mild degenerative changes thoracic spine.  IMPRESSION: No active cardiopulmonary disease.   Electronically Signed   By: Liviu  Pop M.D.   On: 09/08/2014 14:48   Nm Myocar Multi W/spect W/wall Motion / Ef  09/09/2014   HISTORY   OF PRESENT ILLNESS: Nuclear Med Background  Indication for Stress Test:  Chest pain  History: 74 yo male with complaints of chest pain, history of PPM, smoker, HTN and mildly positive troponin.  Cardiac Risk Factors:  HTN, smoker, PPM  Symptoms:  Chest pain  PROCEDURE: Nuclear Pre-Procedure  Caffeine/Decaff Intake:  NPO After: MN.  Lungs:  O2 Stat:  IV 0.9% NS with Angio Cath:  Chest Size (in):  Cup Size:  Height: 5'8"  Weight:  221 lb.  BMI:  Tech Comments:  Nuclear Med Study  1 or 2 day study: 1  Stress Test Type: Lexiscan  Reading MD: Hilty  Order Authorizing Provider: Dhungel  Resting Radionuclide: Sestamibi  Resting Radionuclide Dose:  10 mCi  Stress Radionuclide: Sestamibi  Stress Radionuclide Dose:  30 mCi  Stress Protocol  Rest HR: 61  Stress HR: 63  Rest BP: 133/67  Stress BP: 121/54  Exercise Time (min):  METS:  Dose of Adenosine (mg):  Dose of Lexiscan:  0.4 mg  Dose of Atropine (mg):  Dose of Dobutamine:  Stress Test Technologist:  Nuclear Technologist:  Rest  Procedure:  Stress Procedure:  Transient Ischemic Dilatation (Normal <1.22): 0.92  Lung/Heart Ratio (Normal <0.45):  QGS EDV:  104 ml  QGS ESV:  36 ml  LV Ejection Fraction: 65%  Rest ECG:  NSR with lateral TWI  Stress ECG:  No changes  Raw Data Images:  No significant artifact  Stress Images:  Inferoapical and apical lateral pefusion defect  Rest Images:  Mild lateral perfusion defect  Subtraction (SDS): 4  IMPRESSION: Exercise Capacity: N/A  BP Response: normal  Clinical Symptoms:  Nausea, chills, dyspnea  ECG Impression:  No lexiscan changes  Comparison with Prior Nuclear Study: None  Final Impression:  Intermediate risk study. Moderate sized and intensity, partially reversible inferoapical and apical lateral perfusion defect suggestive of ischemia (possibly distal LCX or OM vessel). LVEF 65%, normal wall motion.   Electronically Signed   By: Kenneth C Hilty   On: 09/09/2014 14:26    PHYSICAL EXAM General: Well developed, well nourished, in no acute distress. Head: Normocephalic, atraumatic, sclera non-icteric, oropharynx is clear Neck: Negative for carotid bruits. JVD not elevated. No adenopathy Lungs: Clear bilaterally to auscultation without wheezes, rales, or rhonchi. Breathing is unlabored. Heart: RRR S1 S2 without murmurs, rubs, or gallops.  Abdomen: Soft, non-tender, non-distended with normoactive bowel sounds. No hepatomegaly. No rebound/guarding. No obvious abdominal masses. Msk:  Strength and tone appears normal for age. Extremities: No clubbing, cyanosis or edema.  Distal pedal pulses are 2+ and equal bilaterally. Neuro: Alert and oriented X 3. Moves all extremities spontaneously. Psych:  Responds to questions appropriately with a normal affect.  ASSESSMENT AND PLAN: 1. Chest pain. Troponin elevated to 0.1 on admission. Subsequent levels normal. Myoview intermediate risk with inferoapical and apical lateral ischemia. EF 65%. For cardiac cath and possible PCI today. Questions answered  about procedure.  2. HTN controlled. 3. S/p pacemaker. 4. CKD stage 2 5. Dementia.   Present on Admission:  . Chest pain . CKD (chronic kidney disease), stage II . Diastolic dysfunction . Essential hypertension . Moderate dementia . S/P placement of cardiac pacemaker . Chest pain at rest . Hypothyroidism . Hyperlipidemia  Signed, Hardin Hardenbrook, MDFACC 09/10/2014 10:24 AM      

## 2014-09-10 NOTE — H&P (View-Only) (Signed)
TELEMETRY: Reviewed telemetry pt in NSR: Filed Vitals:   09/09/14 1640 09/09/14 1700 09/09/14 2037 09/10/14 0540  BP: 116/50  96/53 125/61  Pulse: 63  66 64  Temp:  98 F (36.7 C) 97.9 F (36.6 C) 97.9 F (36.6 C)  TempSrc:  Oral Oral Oral  Resp:   18 16  Height:      Weight:    222 lb 14.4 oz (101.107 kg)  SpO2:    97%    Intake/Output Summary (Last 24 hours) at 09/10/14 1024 Last data filed at 09/09/14 1100  Gross per 24 hour  Intake      0 ml  Output    350 ml  Net   -350 ml   Filed Weights   09/08/14 1402 09/09/14 0246 09/10/14 0540  Weight: 220 lb (99.791 kg) 221 lb 14.4 oz (100.653 kg) 222 lb 14.4 oz (101.107 kg)    Subjective Denies any chest pain this am.  . aspirin  81 mg Oral Daily  . atorvastatin  10 mg Oral q1800  . carvedilol  3.125 mg Oral BID WC  . Chlorhexidine Gluconate Cloth  6 each Topical Q0600  . donepezil  10 mg Oral Daily  . enoxaparin (LOVENOX) injection  40 mg Subcutaneous Q24H  . famotidine  20 mg Oral BID  . fluticasone  2 spray Each Nare Daily  . levothyroxine  50 mcg Oral QAC breakfast  . loratadine  10 mg Oral Daily  . meloxicam  7.5 mg Oral q morning - 10a  . mupirocin ointment  1 application Nasal BID  . sodium chloride  3 mL Intravenous Q12H  . tiZANidine  4 mg Oral TID   . sodium chloride 1 mL/kg/hr (09/10/14 0505)    LABS: Basic Metabolic Panel:  Recent Labs  91/47/82 1459 09/10/14 0042  NA 140 139  K 4.3 4.1  CL 108 109  CO2 25 27  GLUCOSE 119* 126*  BUN 15 15  CREATININE 1.19 1.30  CALCIUM 8.8 8.3*   Liver Function Tests:  Recent Labs  09/08/14 1459  AST 23  ALT 15  ALKPHOS 100  BILITOT 0.7  PROT 5.8*  ALBUMIN 3.4*    Recent Labs  09/08/14 1459  LIPASE 33   CBC:  Recent Labs  09/08/14 1459  WBC 6.5  NEUTROABS 4.2  HGB 12.5*  HCT 37.2*  MCV 88.4  PLT 155   Cardiac Enzymes:  Recent Labs  09/09/14 1410 09/09/14 1900 09/10/14 0042  TROPONINI <0.03 <0.03 <0.03   BNP: No results  for input(s): PROBNP in the last 72 hours. D-Dimer:  Recent Labs  09/08/14 1459  DDIMER 0.43   Hemoglobin A1C: No results for input(s): HGBA1C in the last 72 hours. Fasting Lipid Panel: No results for input(s): CHOL, HDL, LDLCALC, TRIG, CHOLHDL, LDLDIRECT in the last 72 hours. Thyroid Function Tests: No results for input(s): TSH, T4TOTAL, T3FREE, THYROIDAB in the last 72 hours.  Invalid input(s): FREET3   Radiology/Studies:  Dg Chest 2 View  09/08/2014   CLINICAL DATA:  Shortness of Breath  EXAM: CHEST  2 VIEW  COMPARISON:  08/10/2014  FINDINGS: Cardiomediastinal silhouette is stable. Dual lead cardiac pacemaker is unchanged in position. No acute infiltrate or pleural effusion. No pulmonary edema. Mild degenerative changes thoracic spine.  IMPRESSION: No active cardiopulmonary disease.   Electronically Signed   By: Natasha Mead M.D.   On: 09/08/2014 14:48   Nm Myocar Multi W/spect W/wall Motion / Ef  09/09/2014   HISTORY  OF PRESENT ILLNESS: Nuclear Med Background  Indication for Stress Test:  Chest pain  History: 74 yo male with complaints of chest pain, history of PPM, smoker, HTN and mildly positive troponin.  Cardiac Risk Factors:  HTN, smoker, PPM  Symptoms:  Chest pain  PROCEDURE: Nuclear Pre-Procedure  Caffeine/Decaff Intake:  NPO After: MN.  Lungs:  O2 Stat:  IV 0.9% NS with Angio Cath:  Chest Size (in):  Cup Size:  Height: 5'8"  Weight:  221 lb.  BMI:  Tech Comments:  Nuclear Med Study  1 or 2 day study: 1  Stress Test Type: Lexiscan  Reading MD: Hilty  Order Authorizing Provider: Dhungel  Resting Radionuclide: Sestamibi  Resting Radionuclide Dose:  10 mCi  Stress Radionuclide: Sestamibi  Stress Radionuclide Dose:  30 mCi  Stress Protocol  Rest HR: 61  Stress HR: 63  Rest BP: 133/67  Stress BP: 121/54  Exercise Time (min):  METS:  Dose of Adenosine (mg):  Dose of Lexiscan:  0.4 mg  Dose of Atropine (mg):  Dose of Dobutamine:  Stress Test Technologist:  Nuclear Technologist:  Rest  Procedure:  Stress Procedure:  Transient Ischemic Dilatation (Normal <1.22): 0.92  Lung/Heart Ratio (Normal <0.45):  QGS EDV:  104 ml  QGS ESV:  36 ml  LV Ejection Fraction: 65%  Rest ECG:  NSR with lateral TWI  Stress ECG:  No changes  Raw Data Images:  No significant artifact  Stress Images:  Inferoapical and apical lateral pefusion defect  Rest Images:  Mild lateral perfusion defect  Subtraction (SDS): 4  IMPRESSION: Exercise Capacity: N/A  BP Response: normal  Clinical Symptoms:  Nausea, chills, dyspnea  ECG Impression:  No lexiscan changes  Comparison with Prior Nuclear Study: None  Final Impression:  Intermediate risk study. Moderate sized and intensity, partially reversible inferoapical and apical lateral perfusion defect suggestive of ischemia (possibly distal LCX or OM vessel). LVEF 65%, normal wall motion.   Electronically Signed   By: Chrystie NoseKenneth C Hilty   On: 09/09/2014 14:26    PHYSICAL EXAM General: Well developed, well nourished, in no acute distress. Head: Normocephalic, atraumatic, sclera non-icteric, oropharynx is clear Neck: Negative for carotid bruits. JVD not elevated. No adenopathy Lungs: Clear bilaterally to auscultation without wheezes, rales, or rhonchi. Breathing is unlabored. Heart: RRR S1 S2 without murmurs, rubs, or gallops.  Abdomen: Soft, non-tender, non-distended with normoactive bowel sounds. No hepatomegaly. No rebound/guarding. No obvious abdominal masses. Msk:  Strength and tone appears normal for age. Extremities: No clubbing, cyanosis or edema.  Distal pedal pulses are 2+ and equal bilaterally. Neuro: Alert and oriented X 3. Moves all extremities spontaneously. Psych:  Responds to questions appropriately with a normal affect.  ASSESSMENT AND PLAN: 1. Chest pain. Troponin elevated to 0.1 on admission. Subsequent levels normal. Myoview intermediate risk with inferoapical and apical lateral ischemia. EF 65%. For cardiac cath and possible PCI today. Questions answered  about procedure.  2. HTN controlled. 3. S/p pacemaker. 4. CKD stage 2 5. Dementia.   Present on Admission:  . Chest pain . CKD (chronic kidney disease), stage II . Diastolic dysfunction . Essential hypertension . Moderate dementia . S/P placement of cardiac pacemaker . Chest pain at rest . Hypothyroidism . Hyperlipidemia  Signed, Terrel Manalo [place], MDFACC 09/10/2014 10:24 AM

## 2014-09-10 NOTE — Progress Notes (Signed)
[facility] PROGRESS NOTE  ESSAM LOWDERMILK HQI:[id number] DOB: 1940/09/16 DOA: 09/08/2014 PCP: Koren Bound, NP  Assessment/Plan: 1. Chest pain- patient admitted with chest pain had second troponin elevated at 2.10 and throat came back normal. This morning cardiac stress test was done, shows intermediate risk study. Cardiac cath today, cardiology following 2. Hypothyroidism- stable continue Synthroid 3. Dementia-stable, continue Aricept 4. Hyperlipidemia-stable, continue Lipitor 5. Status post pacemaker placement- has history of some intermittent bradycardia, followed by Dr. Johney Frame. Heart rate is well controlled in the hospital  Code Status: Full code Family Communication: *No family at bedside Disposition Plan: Home when stable   Consultants:  Cardiology  Procedures:  Nuclear Myoview stress test  Antibiotics:  None  HPI/Subjective: 74 year old male with a history of hypertension, dyslipidemia status post pacemaker placement for symptom and bradycardia: Noted dementia, moderate aortic insufficiency and came with chest pain yesterday. Patient today underwent nuclear myocardial stress test which shows intermediate risk study. Patient denies any complaints, cardiac cath today.  Objective: Filed Vitals:   09/10/14 1449  BP:   Pulse: 60  Temp:   Resp:    No intake or output data in the 24 hours ending 09/10/14 1540 Filed Weights   09/08/14 1402 09/09/14 0246 09/10/14 0540  Weight: 99.791 kg (220 lb) 100.653 kg (221 lb 14.4 oz) 101.107 kg (222 lb 14.4 oz)    Exam:   General:  Appears in no acute distress  Cardiovascular: S1-S2 regular  Respiratory: Clear to auscultation bilaterally  Abdomen: Soft, nontender, no organomegaly  Musculoskeletal: No edema of lower extremities noted   Data Reviewed: Basic Metabolic Panel:  Recent Labs Lab 09/08/14 1459 09/10/14 0042  NA 140 139  K 4.3 4.1  CL 108 109  CO2 25 27  GLUCOSE 119* 126*  BUN 15 15   CREATININE 1.19 1.30  CALCIUM 8.8 8.3*   Liver Function Tests:  Recent Labs Lab 09/08/14 1459  AST 23  ALT 15  ALKPHOS 100  BILITOT 0.7  PROT 5.8*  ALBUMIN 3.4*    Recent Labs Lab 09/08/14 1459  LIPASE 33   No results for input(s): AMMONIA in the last 168 hours. CBC:  Recent Labs Lab 09/08/14 1459  WBC 6.5  NEUTROABS 4.2  HGB 12.5*  HCT 37.2*  MCV 88.4  PLT 155   Cardiac Enzymes:  Recent Labs Lab 09/09/14 0023 09/09/14 0635 09/09/14 1410 09/09/14 1900 09/10/14 0042  TROPONINI 0.10* <0.03 <0.03 <0.03 <0.03   BNP (last 3 results)  Recent Labs  07/26/14 1315 09/08/14 1459  BNP 18.3 21.1    ProBNP (last 3 results) No results for input(s): PROBNP in the last 8760 hours.  CBG: No results for input(s): GLUCAP in the last 168 hours.  Recent Results (from the past 240 hour(s))  Urine culture     Status: None   Collection Time: 09/08/14  3:08 PM  Result Value Ref Range Status   Specimen Description URINE, RANDOM  Final   Special Requests URINE, RANDOM  Final   Colony Count   Final    20,OOO COLONIES/ML Performed at [facility]    Culture   Final    Multiple bacterial morphotypes present, none predominant. Suggest appropriate recollection if clinically indicated. Performed at [facility]    Report Status 09/09/2014 FINAL  Final  MRSA PCR Screening     Status: Abnormal   Collection Time: 09/08/14  5:47 PM  Result Value Ref Range Status   MRSA by PCR POSITIVE (A) NEGATIVE Final  Comment:        The GeneXpert MRSA Assay (FDA approved for NASAL specimens only), is one component of a comprehensive MRSA colonization surveillance program. It is not intended to diagnose MRSA infection nor to guide or monitor treatment for MRSA infections. RESULT CALLED TO, READ BACK BY AND VERIFIED WITH: COOPER,S RN [phone] SKEEN,P      Studies: Nm Myocar Multi W/spect W/wall Motion / Ef  09/09/2014   HISTORY OF PRESENT ILLNESS:  Nuclear Med Background  Indication for Stress Test:  Chest pain  History: 74 yo male with complaints of chest pain, history of PPM, smoker, HTN and mildly positive troponin.  Cardiac Risk Factors:  HTN, smoker, PPM  Symptoms:  Chest pain  PROCEDURE: Nuclear Pre-Procedure  Caffeine/Decaff Intake:  NPO After: MN.  Lungs:  O2 Stat:  IV 0.9% NS with Angio Cath:  Chest Size (in):  Cup Size:  Height: 5'8"  Weight:  221 lb.  BMI:  Tech Comments:  Nuclear Med Study  1 or 2 day study: 1  Stress Test Type: Lexiscan  Reading MD: Hilty  Order Authorizing Provider: Dhungel  Resting Radionuclide: Sestamibi  Resting Radionuclide Dose:  10 mCi  Stress Radionuclide: Sestamibi  Stress Radionuclide Dose:  30 mCi  Stress Protocol  Rest HR: 61  Stress HR: 63  Rest BP: 133/67  Stress BP: 121/54  Exercise Time (min):  METS:  Dose of Adenosine (mg):  Dose of Lexiscan:  0.4 mg  Dose of Atropine (mg):  Dose of Dobutamine:  Stress Test Technologist:  Nuclear Technologist:  Rest Procedure:  Stress Procedure:  Transient Ischemic Dilatation (Normal <1.22): 0.92  Lung/Heart Ratio (Normal <0.45):  QGS EDV:  104 ml  QGS ESV:  36 ml  LV Ejection Fraction: 65%  Rest ECG:  NSR with lateral TWI  Stress ECG:  No changes  Raw Data Images:  No significant artifact  Stress Images:  Inferoapical and apical lateral pefusion defect  Rest Images:  Mild lateral perfusion defect  Subtraction (SDS): 4  IMPRESSION: Exercise Capacity: N/A  BP Response: normal  Clinical Symptoms:  Nausea, chills, dyspnea  ECG Impression:  No lexiscan changes  Comparison with Prior Nuclear Study: None  Final Impression:  Intermediate risk study. Moderate sized and intensity, partially reversible inferoapical and apical lateral perfusion defect suggestive of ischemia (possibly distal LCX or OM vessel). LVEF 65%, normal wall motion.   Electronically Signed   By: Chrystie NoseKenneth C Hilty   On: 09/09/2014 14:26    Scheduled Meds: . [MAR Hold] aspirin  81 mg Oral Daily  . [MAR Hold]  atorvastatin  10 mg Oral q1800  . [MAR Hold] carvedilol  3.125 mg Oral BID WC  . [MAR Hold] Chlorhexidine Gluconate Cloth  6 each Topical Q0600  . [MAR Hold] donepezil  10 mg Oral Daily  . [START ON 09/11/2014] enoxaparin (LOVENOX) injection  40 mg Subcutaneous Q24H  . [MAR Hold] famotidine  20 mg Oral BID  . [MAR Hold] fluticasone  2 spray Each Nare Daily  . [MAR Hold] levothyroxine  50 mcg Oral QAC breakfast  . [MAR Hold] loratadine  10 mg Oral Daily  . [MAR Hold] meloxicam  7.5 mg Oral q morning - 10a  . [MAR Hold] mupirocin ointment  1 application Nasal BID  . [MAR Hold] tiZANidine  4 mg Oral TID   Continuous Infusions: . sodium chloride      Principal Problem:   Chest pain Active Problems:   Aortic regurgitation   Essential hypertension  S/P placement of cardiac pacemaker   Hyperlipidemia   Consultation without specific complaint   Moderate dementia   Hypothyroidism   Diastolic dysfunction   CKD (chronic kidney disease), stage II   Chest pain at rest   Chest pain of unknown etiology    Time spent: 20 min*    [facility]  [facility] Pager [phone]. If 7PM-7AM, please contact night-coverage at [email or web address], password [facility] 09/10/2014, 3:40 PM  LOS: 1 day

## 2014-09-10 NOTE — Interval H&P Note (Signed)
Cath Lab Visit (complete for each Cath Lab visit)  Clinical Evaluation Leading to the Procedure:   ACS: Yes.    Non-ACS:    Anginal Classification: CCS III  Anti-ischemic medical therapy: Maximal Therapy (2 or more classes of medications)  Non-Invasive Test Results: Intermediate-risk stress test findings: cardiac mortality 1-3%/year  Prior CABG: No previous CABG      History and Physical Interval Note:  09/10/2014 12:35 PM  Cody Vincent  has presented today for surgery, with the diagnosis of cp  The various methods of treatment have been discussed with the patient and family. After consideration of risks, benefits and other options for treatment, the patient has consented to  Procedure(s): LEFT HEART CATHETERIZATION WITH CORONARY ANGIOGRAM (N/A) as a surgical intervention .  The patient's history has been reviewed, patient examined, no change in status, stable for surgery.  I have reviewed the patient's chart and labs.  Questions were answered to the patient's satisfaction.     Lesleigh NoeSMITH III,HENRY W

## 2014-09-10 NOTE — CV Procedure (Signed)
     Left Heart Catheterization with Coronary Angiography  Report  Cody Vincent  74 y.o.  male 08-Aug-1940  Procedure Date: 09/10/2014 Referring Physician: Peter [place]Jordan, M.D. Primary Cardiologist: Peter [place], M.D.  INDICATIONS: Chest pain, intermediate risk myocardial perfusion study, in a patient with atypical chest discomfort. He also had a 1 elevated troponin  PROCEDURE: 1. Left heart catheterization; 2. Coronary angiography; 3. Left ventriculography  CONSENT:  The risks, benefits, and details of the procedure were explained in detail to the patient. Risks including death, stroke, heart attack, kidney injury, allergy, limb ischemia, bleeding and radiation injury were discussed.  The patient verbalized understanding and wanted to proceed.  Informed written consent was obtained.  PROCEDURE TECHNIQUE:  After Xylocaine anesthesia a 5 French Slender sheath was placed in the right radial artery with an angiocath and the modified Seldinger technique.  Coronary angiography was done using a 5 F JR4 and JL 3.5 cm diagnostic catheter.  Left ventriculography was done using the JR 4 catheter and hand injection.   Images were reviewed. No significant obstruction was noted.   CONTRAST:  Total of 90 cc.  COMPLICATIONS:  None   HEMODYNAMICS:  Aortic pressure 129./60 mmHg; LV pressure 129/8 mmHg; LVEDP 20 mmHg  ANGIOGRAPHIC DATA:   The left main coronary artery is normal.  The left anterior descending artery is widely patent. The LAD reaches the left ventricular apex. The LAD proper after the first diagonal origin may be intramyocardial. Multiple luminal irregularities are noted throughout the mid and distal segment. The first diagonal is small second diagonal is large and bifurcates on the lateral wall..  The left circumflex artery is normal. There is a large first marginal that bifurcates on the lateral wall. The continuation of the circumflex is widely patent. A later rising left atrial  recurrent and a small second obtuse marginal arises distally..  The right coronary artery is normal. The right coronary is dominant. The PDA supplies the entire inferior wall and part of the LV apex. A small left ventricular branch arises distally.  LEFT VENTRICULOGRAM:  Left ventricular angiogram was done in the 30 RAO projection and revealed mildly dilated cavity size with low normal LV function, with an estimated ejection fraction of 50-55%.   IMPRESSIONS:  1. Widely patent coronaries. LAD contains luminal irregularities. The LAD may be intramyocardial. 2. Low normal LV systolic function, EF estimated to be 50% 3. False positive myocardial perfusion interpretation  RECOMMENDATION:    Further workup per the treating team.  Follow aortic valve with echocardiography  Consider aortic CT angiogram to properly size and deserves a baseline for chronic follow-up. The aorta was moderately dilated by echocardiography.

## 2014-09-11 ENCOUNTER — Encounter (HOSPITAL_COMMUNITY): Payer: Self-pay | Admitting: Interventional Cardiology

## 2014-09-11 DIAGNOSIS — R079 Chest pain, unspecified: Secondary | ICD-10-CM | POA: Insufficient documentation

## 2014-09-11 DIAGNOSIS — I351 Nonrheumatic aortic (valve) insufficiency: Secondary | ICD-10-CM

## 2014-09-11 DIAGNOSIS — N182 Chronic kidney disease, stage 2 (mild): Secondary | ICD-10-CM

## 2014-09-11 DIAGNOSIS — I519 Heart disease, unspecified: Secondary | ICD-10-CM

## 2014-09-11 MED ORDER — ASPIRIN 81 MG PO CHEW: 81.0000 mg | CHEWABLE_TABLET | Freq: Every day | ORAL | Status: AC

## 2014-09-11 MED ORDER — CARVEDILOL 3.125 MG PO TABS: 3.1250 mg | ORAL_TABLET | Freq: Two times a day (BID) | ORAL | Status: AC

## 2014-09-11 NOTE — Progress Notes (Signed)
CSW Proofreader(Clinical Social Worker) spoke with facility and confirmed pt is able to return. They cannot provide transportation.  CSW (Clinical Child psychotherapistocial Worker) prepared pt dc packet and placed with shadow chart. CSW arranged non-emergent ambulance transport. Pt, pt family, pt nurse, and facility informed. CSW signing off.

## 2014-09-11 NOTE — Clinical Social Work Psychosocial (Signed)
     Clinical Social Work Department BRIEF PSYCHOSOCIAL ASSESSMENT 09/11/2014  Patient:  Minna AntisBOZARTH,Daouda L     Account Number:  [id number]     Admit date:  09/08/2014  Clinical Social Worker:  Harless NakayamaAMBELAL,Jashawn Floyd, LCSWA  Date/Time:  09/11/2014 10:25 AM  Referred by:  Physician  Date Referred:  09/11/2014 Referred for  ALF Placement   Other Referral:   Interview type:  Family Other interview type:   Spoke with pt daughter over the phone    PSYCHOSOCIAL DATA Living Status:  FACILITY [facility] Level of care:  Assisted Living Primary support name:  Letta MoynahanLisa Kindle Primary support relationship to patient:  CHILD, ADULT Degree of support available:   Pt has good support    CURRENT CONCERNS Current Concerns  Post-Acute Placement   Other Concerns:    SOCIAL WORK ASSESSMENT / PLAN CSW made aware pt admitted from facility. CSW spoke with pt daughter Misty StanleyLisa over the phone who confirmed [facility] now. She informed CSW that they have been very happy with care and would like for pt to return at dc. CSW notified of plan for dc today. She was happy with this new. She informed CSW that if facility cannot arrnage transport pt will need PTAR set up for pick up. Pt daughter had a good understanding of pt condition and informed CSW she spoke with hospital staff yesterday and was updated. She had no questions or concerns.  CSW spoke with Porscha at facility who confirmed there should be no issues with pt returning today but they will need to review FL2 prior to dc back. She informed CSW that transportation may be an option depending on time and she will let CSW know when she calls back to confirm discharge. CSW to send updated FL2 once dc summary is available to fax number [phone].   Assessment/plan status:  Psychosocial Support/Ongoing Assessment of Needs Other assessment/ plan:   Information/referral to  community resources:   None needed    PATIENTS/FAMILYS RESPONSE TO PLAN OF CARE: Pt family agreeable for dc back to ALF today.      Delshawn Stech, LCSWA  [phone]

## 2014-09-11 NOTE — Progress Notes (Signed)
Patient Name: Cody Vincent Date of Encounter: 09/11/2014     Principal Problem:   Chest pain Active Problems:   Aortic regurgitation   Essential hypertension   S/P placement of cardiac pacemaker   Hyperlipidemia   Consultation without specific complaint   Moderate dementia   Hypothyroidism   Diastolic dysfunction   CKD (chronic kidney disease), stage II   Chest pain at rest   Chest pain of unknown etiology    SUBJECTIVE  No complaints .Feeling well.  CURRENT MEDS . aspirin  81 mg Oral Daily  . atorvastatin  10 mg Oral q1800  . carvedilol  3.125 mg Oral BID WC  . Chlorhexidine Gluconate Cloth  6 each Topical Q0600  . donepezil  10 mg Oral Daily  . enoxaparin (LOVENOX) injection  40 mg Subcutaneous Q24H  . famotidine  20 mg Oral BID  . fluticasone  2 spray Each Nare Daily  . levothyroxine  50 mcg Oral QAC breakfast  . loratadine  10 mg Oral Daily  . meloxicam  7.5 mg Oral q morning - 10a  . mupirocin ointment  1 application Nasal BID  . tiZANidine  4 mg Oral TID    OBJECTIVE  Filed Vitals:   09/10/14 1751 09/10/14 1821 09/10/14 2104 09/11/14 0349  BP: 123/49 112/57 104/50 113/55  Pulse:   60 63  Temp:   97.8 F (36.6 C) 97.2 F (36.2 C)  TempSrc:   Oral Oral  Resp:   16 16  Height:      Weight:    220 lb 6.4 oz (99.973 kg)  SpO2:    100%    Intake/Output Summary (Last 24 hours) at 09/11/14 0919 Last data filed at 09/10/14 2310  Gross per 24 hour  Intake    100 ml  Output    200 ml  Net   -100 ml   Filed Weights   09/09/14 0246 09/10/14 0540 09/11/14 0349  Weight: 221 lb 14.4 oz (100.653 kg) 222 lb 14.4 oz (101.107 kg) 220 lb 6.4 oz (99.973 kg)    PHYSICAL EXAM  General: Pleasant, NAD. Neuro: Alert and oriented X 3. Moves all extremities spontaneously. Psych: Normal affect. HEENT:  Normal  Neck: Supple without bruits or JVD. Lungs:  Resp regular and unlabored, CTA. Heart: RRR no s3, s4, or murmurs. Abdomen: Soft, non-tender,  non-distended, BS + x 4.  Extremities: No clubbing, cyanosis or edema. DP/PT/Radials 2+ and equal bilaterally. Radial site stable  Accessory Clinical Findings  CBC  Recent Labs  09/08/14 1459  WBC 6.5  NEUTROABS 4.2  HGB 12.5*  HCT 37.2*  MCV 88.4  PLT 155   Basic Metabolic Panel  Recent Labs  09/08/14 1459 09/10/14 0042  NA 140 139  K 4.3 4.1  CL 108 109  CO2 25 27  GLUCOSE 119* 126*  BUN 15 15  CREATININE 1.19 1.30  CALCIUM 8.8 8.3*   Liver Function Tests  Recent Labs  09/08/14 1459  AST 23  ALT 15  ALKPHOS 100  BILITOT 0.7  PROT 5.8*  ALBUMIN 3.4*    Recent Labs  09/08/14 1459  LIPASE 33   Cardiac Enzymes  Recent Labs  09/09/14 1410 09/09/14 1900 09/10/14 0042  TROPONINI <0.03 <0.03 <0.03   BNP Invalid input(s): POCBNP D-Dimer  Recent Labs  09/08/14 1459  DDIMER 0.43    TELE  A paced  Radiology/Studies  Dg Chest 2 View  09/08/2014   CLINICAL DATA:  Shortness of Breath  EXAM:  CHEST  2 VIEW  COMPARISON:  08/10/2014  FINDINGS: Cardiomediastinal silhouette is stable. Dual lead cardiac pacemaker is unchanged in position. No acute infiltrate or pleural effusion. No pulmonary edema. Mild degenerative changes thoracic spine.  IMPRESSION: No active cardiopulmonary disease.   Electronically Signed   By: Natasha Mead M.D.   On: 09/08/2014 14:48   Nm Myocar Multi W/spect W/wall Motion / Ef  09/09/2014   HISTORY OF PRESENT ILLNESS: Nuclear Med Background  Indication for Stress Test:  Chest pain  History: 74 yo male with complaints of chest pain, history of PPM, smoker, HTN and mildly positive troponin.  Cardiac Risk Factors:  HTN, smoker, PPM  Symptoms:  Chest pain  PROCEDURE: Nuclear Pre-Procedure  Caffeine/Decaff Intake:  NPO After: MN.  Lungs:  O2 Stat:  IV 0.9% NS with Angio Cath:  Chest Size (in):  Cup Size:  Height: 5'8"  Weight:  221 lb.  BMI:  Tech Comments:  Nuclear Med Study  1 or 2 day study: 1  Stress Test Type: Lexiscan  Reading MD:  Hilty  Order Authorizing Provider: Dhungel  Resting Radionuclide: Sestamibi  Resting Radionuclide Dose:  10 mCi  Stress Radionuclide: Sestamibi  Stress Radionuclide Dose:  30 mCi  Stress Protocol  Rest HR: 61  Stress HR: 63  Rest BP: 133/67  Stress BP: 121/54  Exercise Time (min):  METS:  Dose of Adenosine (mg):  Dose of Lexiscan:  0.4 mg  Dose of Atropine (mg):  Dose of Dobutamine:  Stress Test Technologist:  Nuclear Technologist:  Rest Procedure:  Stress Procedure:  Transient Ischemic Dilatation (Normal <1.22): 0.92  Lung/Heart Ratio (Normal <0.45):  QGS EDV:  104 ml  QGS ESV:  36 ml  LV Ejection Fraction: 65%  Rest ECG:  NSR with lateral TWI  Stress ECG:  No changes  Raw Data Images:  No significant artifact  Stress Images:  Inferoapical and apical lateral pefusion defect  Rest Images:  Mild lateral perfusion defect  Subtraction (SDS): 4  IMPRESSION: Exercise Capacity: N/A  BP Response: normal  Clinical Symptoms:  Nausea, chills, dyspnea  ECG Impression:  No lexiscan changes  Comparison with Prior Nuclear Study: None  Final Impression:  Intermediate risk study. Moderate sized and intensity, partially reversible inferoapical and apical lateral perfusion defect suggestive of ischemia (possibly distal LCX or OM vessel). LVEF 65%, normal wall motion.   Electronically Signed   By: Chrystie Nose   On: 09/09/2014 14:26    ASSESSMENT AND PLAN  74 year old male with a history of hypertension, dyslipidemia, symptomatic bradycardia s/p PPM,  Dementia, and moderate aortic insufficiency who presented to [facility] on 09/08/14 with chest pain. Patient today underwent nuclear myocardial stress test which shows intermediate risk study.  Chest pain. Troponin elevated to 0.1 on admission. Subsequent levels normal. Myoview intermediate risk with inferoapical and apical lateral ischemia. EF 65%. For cardiac cath and possible PCI today.  -- S/p LHC yesterday which revealed   1. Widely patent coronaries. LAD contains luminal  irregularities. The LAD may be intramyocardial.  2. Low normal LV systolic function, EF estimated to be 50%  3. False positive myocardial perfusion interpretation -- Radial site stable.   Aortic regurgitation- moderate by 2D ECHO 03/2013. Consider follow up 2D ECHO to follow aortic valve this admission as recommended in cath note.   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   HTN controlled.  Bradycardia s/p pacemaker- stable  CKD stage 2- creat stable 1.3  Dementia.  Billy Fischer PA-C  Pager [phone]  Patient seen and examined and history reviewed. Agree with above findings and plan. Doing well post cath. No significant CAD noted so Myoview was falsely positive. Some enlargement of the aorta noted but this can be followed up as an outpatient. OK for DC from our standpoint.  Peter Swazil[place], MDFACC 09/11/2014 11:27 AM

## 2014-09-11 NOTE — Discharge Summary (Signed)
Physician Discharge Summary  Cody Vincent WUJ:[id number] DOB: 1941/02/19 DOA: 09/08/2014  PCP: Koren Bound, NP  Admit date: 09/08/2014 Discharge date: 09/11/2014  Time spent: 45 minutes  Recommendations for Outpatient Follow-up:   1. Recommend PT INR, chem 12, CBC plus differential about 1 week-unclear as to why APTT is elevated 2. Recommend outpatient follow-up with provider/cardiology 3. Recommend screening/further management dementia 4. Consider repeat TSH 3 weeks around mid April 5. Consider follow-up with Dr. Johney Frame pacemaker check-during hospitalization pacer seemed to be working fine on telemetry  6. Please get nonemergent CT scan chest to determine aortic valve dilatation as a baseline as per cardiology recommendation--his chest pain may be secondary to severe aortic stenosis? Outpatient cardiology follow-up to determine need for changes in therapies as these patients are typically preload dependent and may get chest pain because of poor perfusion/volume depletion  Discharge Diagnoses:  Principal Problem:   Chest pain Active Problems:   Aortic regurgitation   Essential hypertension   S/P placement of cardiac pacemaker   Hyperlipidemia   Consultation without specific complaint   Moderate dementia   Hypothyroidism   Diastolic dysfunction   CKD (chronic kidney disease), stage II   Chest pain at rest   Chest pain of unknown etiology   Discharge Condition: Stable and unchanged  Diet recommendation: Heart healthy low-salt prior diet  Filed Weights   09/09/14 0246 09/10/14 0540 09/11/14 0349  Weight: 100.653 kg (221 lb 14.4 oz) 101.107 kg (222 lb 14.4 oz) 99.973 kg (220 lb 6.4 oz)    History of present illness:  74 year old male, known history PPM S/P bradycardia [symptoms were sinus bradycardia-PPM placed 03/19/13-[employer] device], GERD, hypothyroid, tobacco abuse, moderate dementia by exam, HTN, and recent admission 2/12-->2/13 for dyspnea,? UTI, dementia  followed by neurologist Dr. Max Sane start Aricept] He was discharged and returned to the emergency room 09/08/14 with left-sided chest pain sharp lasting 1-2 minutes Blood pressure 87/50 without intervention improved Troponin initially negative Heart score on admission 4-troponins highest peak 0.10 Myoview performed 3/28 = inferoapical and apical lateral ischemia EF 65% Cardiac catheterization performed 3/29 =  1. Widely patent coronaries. LAD contains luminal irregularities. The LAD may be intramyocardial. 2. Low normal LV systolic function, EF estimated to be 50% 3. False positive myocardial perfusion interpretation  Patient was recommended to get aortic CT as aortic valve was noted by echocardiogram this admission to be moderately dilated right echocardiographic evaluation 03/29/13-there was noted at that echocardiogram as well severe to moderate AoV regurgitation and grade 1 diastolic dysfunction which was euvolemic this admission   Procedures:  Cardiac catheterization as above  Myoview stress test as above (i.e. Studies not automatically included, echos, thoracentesis, etc; not x-rays)  Consultations:  Cardiology  Discharge Exam: Filed Vitals:   09/11/14 0349  BP: 113/55  Pulse: 63  Temp: 97.2 F (36.2 C)  Resp: 16    Doing well. Tells me that he is not happy at his assisted living facility Tolerating some diet but not eating much Multiple glasses of water and soft drinks in the room No spontaneous complaint of fever chills nausea vomiting chest pain  General: EOMI NCAT flat affect, slow mentation, good eye contact Cardiovascular: S1-S2 no murmur rub or gallop-telemetry = paced rhythm no arrhythmia noted Respiratory: Clinically clear no added sound Skin and lower extremities-no edema  Discharge Instructions   Discharge Instructions    Diet - low sodium heart healthy    Complete by:  As directed  Discharge instructions    Complete by:  As directed    You were admitted to the hospital because of chest pain Your chest pain was not thought to be cardiac in you had tests that ruled out any other issue I would recommend that you get some lab work done within the next 1-2 weeks by her primary physician This patient should probably be screened for dementia as an outpatient with either an MMSE or Moca     Increase activity slowly    Complete by:  As directed           Current Discharge Medication List    START taking these medications   Details  aspirin 81 MG chewable tablet Chew 1 tablet (81 mg total) by mouth daily. Qty: 30 tablet, Refills: 0    carvedilol (COREG) 3.125 MG tablet Take 1 tablet (3.125 mg total) by mouth 2 (two) times daily with a meal. Qty: 60 tablet, Refills: 0      CONTINUE these medications which have NOT CHANGED   Details  albuterol (PROVENTIL HFA;VENTOLIN HFA) 108 (90 BASE) MCG/ACT inhaler Inhale 2 puffs into the lungs every 6 (six) hours as needed for wheezing or shortness of breath.    cetirizine (ZYRTEC) 10 MG tablet Take 10 mg by mouth daily.    donepezil (ARICEPT) 10 MG tablet Take 1 tablet (10 mg total) by mouth daily. Qty: 90 tablet, Refills: 3   Associated Diagnoses: Moderate dementia, without behavioral disturbance    ergocalciferol (VITAMIN D2) 50000 UNITS capsule Take 50,000 Units by mouth once a week.    fluticasone (FLONASE) 50 MCG/ACT nasal spray Place 2 sprays into both nostrils daily.    gabapentin (NEURONTIN) 100 MG capsule Take 100 mg by mouth See admin instructions. Pt takes  at bedtime x3 days, then  x3 days, then  daily    guaiFENesin (MUCINEX) 600 MG 12 hr tablet Take 600 mg by mouth 2 (two) times daily as needed for cough or to loosen phlegm.    hydrocortisone cream 1 % Apply topically 2 (two) times daily. Qty: 30 g, Refills: 0    levothyroxine (SYNTHROID, LEVOTHROID) 50 MCG tablet Take 50 mcg by mouth daily before breakfast.    losartan (COZAAR) 50 MG tablet Take 1  tablet (50 mg total) by mouth daily. Qty: 90 tablet, Refills: 1   Associated Diagnoses: Essential hypertension    meclizine (ANTIVERT) 25 MG tablet Take 25 mg by mouth 3 (three) times daily as needed for dizziness.    meloxicam (MOBIC) 7.5 MG tablet Take 7.5 mg by mouth every morning.    naproxen sodium (ANAPROX) 220 MG tablet Take 220 mg by mouth 2 (two) times daily as needed (headaches).    ranitidine (ZANTAC) 150 MG tablet Take 150 mg by mouth 2 (two) times daily.    sodium chloride (OCEAN) 0.65 % SOLN nasal spray Place 1 spray into both nostrils 2 (two) times daily as needed for congestion.    tiZANidine (ZANAFLEX) 4 MG capsule Take 4 mg by mouth 3 (three) times daily as needed for muscle spasms.    acetaminophen (TYLENOL) 325 MG tablet Take 650 mg by mouth every 6 (six) hours as needed for mild pain.    atorvastatin (LIPITOR) 10 MG tablet TAKE 1 TABLET BY MOUTH ONCE DAILY. Qty: 30 tablet, Refills: 3    ketoconazole (NIZORAL) 2 % cream Apply topically 2 (two) times daily. Qty: 15 g, Refills: 0       No Known Allergies  The results of significant diagnostics from this hospitalization (including imaging, microbiology, ancillary and laboratory) are listed below for reference.    Significant Diagnostic Studies: Dg Chest 2 View  09/08/2014   CLINICAL DATA:  Shortness of Breath  EXAM: CHEST  2 VIEW  COMPARISON:  08/10/2014  FINDINGS: Cardiomediastinal silhouette is stable. Dual lead cardiac pacemaker is unchanged in position. No acute infiltrate or pleural effusion. No pulmonary edema. Mild degenerative changes thoracic spine.  IMPRESSION: No active cardiopulmonary disease.   Electronically Signed   By: Natasha MeadLiviu  Pop M.D.   On: 09/08/2014 14:48   Nm Myocar Multi W/spect W/wall Motion / Ef  09/09/2014   HISTORY OF PRESENT ILLNESS: Nuclear Med Background  Indication for Stress Test:  Chest pain  History: 74 yo male with complaints of chest pain, history of PPM, smoker, HTN and mildly  positive troponin.  Cardiac Risk Factors:  HTN, smoker, PPM  Symptoms:  Chest pain  PROCEDURE: Nuclear Pre-Procedure  Caffeine/Decaff Intake:  NPO After: MN.  Lungs:  O2 Stat:  IV 0.9% NS with Angio Cath:  Chest Size (in):  Cup Size:  Height: 5'8"  Weight:  221 lb.  BMI:  Tech Comments:  Nuclear Med Study  1 or 2 day study: 1  Stress Test Type: Lexiscan  Reading MD: Hilty  Order Authorizing Provider: Dhungel  Resting Radionuclide: Sestamibi  Resting Radionuclide Dose:  10 mCi  Stress Radionuclide: Sestamibi  Stress Radionuclide Dose:  30 mCi  Stress Protocol  Rest HR: 61  Stress HR: 63  Rest BP: 133/67  Stress BP: 121/54  Exercise Time (min):  METS:  Dose of Adenosine (mg):  Dose of Lexiscan:  0.4 mg  Dose of Atropine (mg):  Dose of Dobutamine:  Stress Test Technologist:  Nuclear Technologist:  Rest Procedure:  Stress Procedure:  Transient Ischemic Dilatation (Normal <1.22): 0.92  Lung/Heart Ratio (Normal <0.45):  QGS EDV:  104 ml  QGS ESV:  36 ml  LV Ejection Fraction: 65%  Rest ECG:  NSR with lateral TWI  Stress ECG:  No changes  Raw Data Images:  No significant artifact  Stress Images:  Inferoapical and apical lateral pefusion defect  Rest Images:  Mild lateral perfusion defect  Subtraction (SDS): 4  IMPRESSION: Exercise Capacity: N/A  BP Response: normal  Clinical Symptoms:  Nausea, chills, dyspnea  ECG Impression:  No lexiscan changes  Comparison with Prior Nuclear Study: None  Final Impression:  Intermediate risk study. Moderate sized and intensity, partially reversible inferoapical and apical lateral perfusion defect suggestive of ischemia (possibly distal LCX or OM vessel). LVEF 65%, normal wall motion.   Electronically Signed   By: Chrystie NoseKenneth C Hilty   On: 09/09/2014 14:26    Microbiology: Recent Results (from the past 240 hour(s))  Urine culture     Status: None   Collection Time: 09/08/14  3:08 PM  Result Value Ref Range Status   Specimen Description URINE, RANDOM  Final   Special Requests URINE,  RANDOM  Final   Colony Count   Final    20,OOO COLONIES/ML Performed at [facility]    Culture   Final    Multiple bacterial morphotypes present, none predominant. Suggest appropriate recollection if clinically indicated. Performed at [facility]    Report Status 09/09/2014 FINAL  Final  MRSA PCR Screening     Status: Abnormal   Collection Time: 09/08/14  5:47 PM  Result Value Ref Range Status   MRSA by PCR POSITIVE (A) NEGATIVE Final  Comment:        The GeneXpert MRSA Assay (FDA approved for NASAL specimens only), is one component of a comprehensive MRSA colonization surveillance program. It is not intended to diagnose MRSA infection nor to guide or monitor treatment for MRSA infections. RESULT CALLED TO, READ BACK BY AND VERIFIED WITH: COOPER,S RN [id number] SKEEN,P      Labs: Basic Metabolic Panel:  Recent Labs Lab 09/08/14 1459 09/10/14 0042  NA 140 139  K 4.3 4.1  CL 108 109  CO2 25 27  GLUCOSE 119* 126*  BUN 15 15  CREATININE 1.19 1.30  CALCIUM 8.8 8.3*   Liver Function Tests:  Recent Labs Lab 09/08/14 1459  AST 23  ALT 15  ALKPHOS 100  BILITOT 0.7  PROT 5.8*  ALBUMIN 3.4*    Recent Labs Lab 09/08/14 1459  LIPASE 33   No results for input(s): AMMONIA in the last 168 hours. CBC:  Recent Labs Lab 09/08/14 1459  WBC 6.5  NEUTROABS 4.2  HGB 12.5*  HCT 37.2*  MCV 88.4  PLT 155   Cardiac Enzymes:  Recent Labs Lab 09/09/14 0023 09/09/14 0635 09/09/14 1410 09/09/14 1900 09/10/14 0042  TROPONINI 0.10* <0.03 <0.03 <0.03 <0.03   BNP: BNP (last 3 results)  Recent Labs  07/26/14 1315 09/08/14 1459  BNP 18.3 21.1    ProBNP (last 3 results) No results for input(s): PROBNP in the last 8760 hours.  CBG: No results for input(s): GLUCAP in the last 168 hours.     SignedRhetta Mura  [facility] 09/11/2014, 10:59 AM

## 2014-09-12 ENCOUNTER — Telehealth: Payer: Self-pay

## 2014-09-12 DIAGNOSIS — I351 Nonrheumatic aortic (valve) insufficiency: Secondary | ICD-10-CM

## 2014-09-12 NOTE — Telephone Encounter (Signed)
Message    Cathey,   Can you please obtain an echo for aortic regurgitation and arrange for f/u with me within the next few weeks?   Thanks.      Dr. Purvis SheffieldKoneswaran      ----- Message -----    From: Hillis RangeJames Allred, MD    Sent: 09/11/2014  9:08 PM     To: Laqueta LindenSuresh A Koneswaran, MD, *   Subject: FW: Inpatient Notes                   Thanks Gerlean RenJai      I will forward to Dr Purvis SheffieldKoneswaran who is primary cardiologist for Cody Vincent.         Order placed,forwarded to Raechel Acheerry Goins to schedule echo and follow up with Dr Purvis SheffieldKoneswaran

## 2014-10-08 ENCOUNTER — Ambulatory Visit: Payer: Medicare Other | Admitting: Cardiovascular Disease

## 2014-10-21 ENCOUNTER — Ambulatory Visit (INDEPENDENT_AMBULATORY_CARE_PROVIDER_SITE_OTHER): Payer: 59 | Admitting: Physician Assistant

## 2014-10-21 ENCOUNTER — Other Ambulatory Visit: Payer: Self-pay | Admitting: *Deleted

## 2014-10-21 ENCOUNTER — Encounter: Payer: Self-pay | Admitting: Physician Assistant

## 2014-10-21 VITALS — BP 142/62 | HR 63 | Ht 68.0 in | Wt 224.4 lb

## 2014-10-21 DIAGNOSIS — I10 Essential (primary) hypertension: Secondary | ICD-10-CM

## 2014-10-21 DIAGNOSIS — I719 Aortic aneurysm of unspecified site, without rupture: Secondary | ICD-10-CM

## 2014-10-21 DIAGNOSIS — I718 Aortic aneurysm of unspecified site, ruptured: Secondary | ICD-10-CM

## 2014-10-21 DIAGNOSIS — Z950 Presence of cardiac pacemaker: Secondary | ICD-10-CM

## 2014-10-21 DIAGNOSIS — I519 Heart disease, unspecified: Secondary | ICD-10-CM

## 2014-10-21 DIAGNOSIS — I351 Nonrheumatic aortic (valve) insufficiency: Secondary | ICD-10-CM | POA: Diagnosis not present

## 2014-10-21 DIAGNOSIS — I5189 Other ill-defined heart diseases: Secondary | ICD-10-CM

## 2014-10-21 DIAGNOSIS — R0789 Other chest pain: Secondary | ICD-10-CM

## 2014-10-21 LAB — BASIC METABOLIC PANEL
BUN: 16 mg/dL (ref 6–23)
CALCIUM: 9.5 mg/dL (ref 8.4–10.5)
CO2: 31 meq/L (ref 19–32)
CREATININE: 1.11 mg/dL (ref 0.40–1.50)
Chloride: 105 mEq/L (ref 96–112)
GFR: 69.24 mL/min (ref >60.00)
Glucose, Bld: 95 mg/dL (ref 70–99)
Potassium: 4.2 mEq/L (ref 3.5–5.1)
SODIUM: 138 meq/L (ref 135–145)

## 2014-10-21 NOTE — Addendum Note (Signed)
Addended by: Oleta MouseVERTON, Oluwatobi Visser M on: 10/21/2014 11:41 AM   Modules accepted: Orders

## 2014-10-21 NOTE — Assessment & Plan Note (Signed)
Patient has history of aortic insufficiency. Will recheck echo and CT angiogram. Follow-up with Dr. SwazilandJordan as [place].

## 2014-10-21 NOTE — Assessment & Plan Note (Addendum)
Blood Pressure stable 

## 2014-10-21 NOTE — Assessment & Plan Note (Signed)
No further chest pain. Abnormal Myoview was false positive with normal cardiac catheterization 09/10/14.

## 2014-10-21 NOTE — Progress Notes (Signed)
Cardiology Office Note   Date:  10/21/2014   ID:  Cody Vincent, DOB August 23, 1940, MRN [id number]  PCP:  Cody Bound, NP  Cardiologist:  Dr. Christianne Vincent now lives in care facility in [place] so will see Dr. Swaziland) EPS: Dr. Johney Vincent   Chief Complaint: No cardiac complaints    History of Present Illness: Cody Vincent is a 74 y.o. male who presents for post hospital follow-up. 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 He presented to the hospital with chest pain and negative troponins. Myoview performed on 09/09/14 showed inferior apical and apical lateral ischemia EF 65%. Cardiac catheterization performed the next day showed widely patent coronaries, LAD contains luminal irregularities, EF 50%, false positive myocardial perfusion interpretation. Was recommended he get an aortic CT as he aortic valve was noted by echo to be moderately dilated. Prior echo in 2014 showed moderate to severe aortic regurgitation and grade 1 diastolic dysfunction. Prior CT angios of the chest in 2014 showed no thoracic aortic aneurysm or dissection.  Patient lives in a care facility for dementia and is not very active. He denies any chest pain, palpitations, dyspnea, dyspnea on exertion, dizziness or presyncope.    Past Medical History  Diagnosis Date  . Near syncope   . Sinus bradycardia   . Diastolic murmur   . Hypertension   . Renal insufficiency   . History of tobacco abuse   . Syncope   . Dementia   . Seborrheic dermatitis   . Hyperlipidemia     Past Surgical History  Procedure Laterality Date  . Pacemaker insertion  03/19/13    SJM pacemaker implanted for symptomatic sinus bradycardia by Dr Cody Vincent  . Kidney stone surgery  06/2013  . Multiple tooth extractions      Full Dentures  . Permanent pacemaker insertion N/A 03/19/2013    Procedure: PERMANENT  PACEMAKER INSERTION;  Surgeon: Cody Rhyme, MD;  Location: [facility] CATH LAB;  Service: Cardiovascular;  Laterality: N/A;  . Left heart catheterization with coronary angiogram N/A 09/10/2014    Procedure: LEFT HEART CATHETERIZATION WITH CORONARY ANGIOGRAM;  Surgeon: Cody Records, MD;  Location: [facility] CATH LAB;  Service: Cardiovascular;  Laterality: N/A;     Current Outpatient Prescriptions  Medication Sig Dispense Refill  . albuterol (PROVENTIL HFA;VENTOLIN HFA) 108 (90 BASE) MCG/ACT inhaler Inhale 2 puffs into the lungs every 6 (six) hours as needed for wheezing or shortness of breath.    Marland Kitchen aspirin 81 MG chewable tablet Chew 1 tablet (81 mg total) by mouth daily. 30 tablet 0  . atorvastatin (LIPITOR) 10 MG tablet TAKE 1 TABLET BY MOUTH ONCE DAILY. 30 tablet 3  . carvedilol (COREG) 3.125 MG tablet Take 1 tablet (3.125 mg total) by mouth 2 (two) times daily with a meal. 60 tablet 0  . cetirizine (ZYRTEC) 10 MG tablet Take 10 mg by mouth daily.    Marland Kitchen donepezil (ARICEPT) 10 MG tablet Take 1 tablet (10 mg total) by mouth daily. 90 tablet 3  . fluticasone (FLONASE) 50 MCG/ACT nasal spray Place 2 sprays into both nostrils daily.    Marland Kitchen gabapentin (NEURONTIN) 300 MG capsule Take 300 mg by mouth at bedtime.    Marland Kitchen guaiFENesin (MUCINEX) 600 MG 12 hr tablet Take 600 mg by mouth 2 (two) times daily as needed for cough or to loosen phlegm.    . hydrocortisone cream 1 %  Apply topically 2 (two) times daily. 30 g 0  . ketoconazole (NIZORAL) 2 % cream Apply topically 2 (two) times daily. 15 g 0  . levothyroxine (SYNTHROID, LEVOTHROID) 125 MCG tablet Take 125 mcg by mouth daily before breakfast.    . losartan (COZAAR) 50 MG tablet Take 1 tablet (50 mg total) by mouth daily. 90 tablet 1  . meclizine (ANTIVERT) 25 MG tablet Take 25 mg by mouth 3 (three) times daily as needed for dizziness.    . meloxicam (MOBIC) 7.5 MG tablet Take 7.5 mg by mouth every morning.    . naproxen sodium (ANAPROX) 220 MG tablet Take 220 mg by  mouth 2 (two) times daily as needed (headaches).    . ranitidine (ZANTAC) 150 MG tablet Take 150 mg by mouth 2 (two) times daily.    . sodium chloride (OCEAN) 0.65 % SOLN nasal spray Place 1 spray into both nostrils 2 (two) times daily as needed for congestion.    Marland Kitchen. tiZANidine (ZANAFLEX) 4 MG capsule Take 4 mg by mouth 3 (three) times daily as needed for muscle spasms.    . Vitamin D, Cholecalciferol, 1000 UNITS TABS Take 1 capsule by mouth daily.     No current facility-administered medications for this visit.    Allergies:   Review of patient's allergies indicates no known allergies.    Social History:  The patient  reports that he quit smoking about 18 years ago. His smoking use included Cigarettes. He has a 117 pack-year smoking history. He has never used smokeless tobacco. He reports that he does not drink alcohol or use illicit drugs.   Family History:  The patient's    family history includes Cancer in his father; Dementia in his mother; Healthy in his brother; Heart disease in his father and mother; Hypertension in his daughter; Stroke (age of onset: 1290) in his mother.    ROS:  Please see the history of present illness.   Otherwise, review of systems are positive for depression.   All other systems are reviewed and negative.    PHYSICAL EXAM: VS:  BP 142/62 mmHg  Pulse 63  Ht 5\' 8"  (1.727 m)  Wt 224 lb 6.4 oz (101.787 kg)  BMI 34.13 kg/m2 , BMI Body mass index is 34.13 kg/(m^2). GEN: Well nourished, well developed, in no acute distress Neck: no JVD, HJR, carotid bruits, or masses Cardiac: RRR; 1 to 2/6 distolic murmur at the left sternal border, no gallop, rubs, thrill or heave,  Respiratory:  clear to auscultation bilaterally, normal work of breathing GI: soft, nontender, nondistended, + BS MS: no deformity or atrophy Extremities: Right arm without hematoma or hemorrhage at cath site good radial and brachial pulses otherwise lower extremities without cyanosis, clubbing,  edema, good distal pulses bilaterally.  Skin: warm and dry, no rash Neuro:  Strength and sensation are intact    EKG:  EKG is ordered today. The ekg ordered today demonstrates atrial paced rhythm   Recent Labs: 07/26/2014: Magnesium 1.9; TSH 3.305 09/08/2014: ALT 15; B Natriuretic Peptide 21.1; Hemoglobin 12.5*; Platelets 155 09/10/2014: BUN 15; Creatinine 1.30; Potassium 4.1; Sodium 139    Lipid Panel    Component Value Date/Time   CHOL 147 05/31/2014 1141   TRIG 161.0* 05/31/2014 1141   HDL 33.60* 05/31/2014 1141   CHOLHDL 4 05/31/2014 1141   VLDL 32.2 05/31/2014 1141   LDLCALC 81 05/31/2014 1141      Wt Readings from Last 3 Encounters:  10/21/14 224 lb 6.4 oz (101.787  kg)  09/11/14 220 lb 6.4 oz (99.973 kg)  08/09/14 220 lb (99.791 kg)      Other studies Reviewed: Additional studies/ Vincent that were reviewed today include and review of the Vincent demonstrates:  Stress Myoview 09/09/14: Stress Images:  Inferoapical and apical lateral pefusion defect   Rest Images:  Mild lateral perfusion defect    Cardiac catheterization 09/10/14 :  LEFT VENTRICULOGRAM:  Left ventricular angiogram was done in the 30 RAO projection and revealed mildly dilated cavity size with low normal LV function, with an estimated ejection fraction of 50-55%.   IMPRESSIONS:  1. Widely patent coronaries. LAD contains luminal irregularities. The LAD may be intramyocardial. 2. Low normal LV systolic function, EF estimated to be 50% 3. False positive myocardial perfusion interpretation  RECOMMENDATION:     Further workup per the treating team.  Follow aortic valve with echocardiography  Consider aortic CT angiogram to properly size and deserves a baseline for chronic follow-up. The aorta was moderately dilated by echocardiography   ASSESSMENT AND PLAN: Aortic regurgitation Patient has history of aortic insufficiency. Will recheck echo and CT angiogram. Follow-up with Dr. SwazilandJordan as  [place] lives [place] in [place] in a care facility.   Essential hypertension Blood Pressure stable   S/P placement of cardiac pacemaker Followed by Dr. Johney FrameAllred.   Diastolic dysfunction No evidence of heart failure.   Chest pain No further chest pain. Abnormal Myoview was false positive with normal cardiac catheterization 09/10/14.      Elson ClanSigned, Maryuri Warnke, PA-C  10/21/2014 10:27 AM    [facility] [address], [place], [place]  [address] Phone: [phone]; Fax: [phone]

## 2014-10-21 NOTE — Addendum Note (Signed)
Addended by: Oleta MouseVERTON, Damare Serano M on: 10/21/2014 10:53 AM   Modules accepted: Orders

## 2014-10-21 NOTE — Patient Instructions (Signed)
.  Medication Instructions:   Your physician recommends that you continue on your current medications as directed. Please refer to the Current Medication list given to you today.  Labwork:   Testing/Procedures: Your physician has requested that you have an echocardiogram. Echocardiography is a painless test that uses sound waves to create images of your heart. It provides your doctor with information about the size and shape of your heart and how well your heart's chambers and valves are working. This procedure takes approximately one hour. There are no restrictions for this procedure.  Non-Cardiac CT Angiography (CTA), is a special type of CT scan that uses a computer to produce multi-dimensional views of major blood vessels throughout the body. In CT angiography, a contrast material is injected through an IV to help visualize the blood vessels   Follow-Up:  WITH DR SwazilandJORDAN FOLLOWING THESE APPOINTMENTS WITH HIS NEXT AVAILABLE TIME   Any Other Special Instructions Will Be Listed Below (If Applicable).

## 2014-10-21 NOTE — Assessment & Plan Note (Signed)
No evidence of heart failure. 

## 2014-10-21 NOTE — Assessment & Plan Note (Signed)
Followed by Dr. Allred. 

## 2014-10-22 ENCOUNTER — Encounter: Payer: Self-pay | Admitting: *Deleted

## 2014-10-22 ENCOUNTER — Telehealth: Payer: Self-pay | Admitting: *Deleted

## 2014-10-22 NOTE — Telephone Encounter (Signed)
-----   Message from Laqueta LindenSuresh A Koneswaran, MD sent at 10/21/2014  3:01 PM EDT ----- Normal.

## 2014-10-22 NOTE — Telephone Encounter (Signed)
Mailed pt letter, unable to reach by phone, routed to pcp

## 2014-10-23 ENCOUNTER — Ambulatory Visit (HOSPITAL_COMMUNITY): Payer: Medicare Other | Attending: Cardiology

## 2014-10-23 ENCOUNTER — Other Ambulatory Visit: Payer: Self-pay

## 2014-10-23 ENCOUNTER — Ambulatory Visit (INDEPENDENT_AMBULATORY_CARE_PROVIDER_SITE_OTHER)
Admission: RE | Admit: 2014-10-23 | Discharge: 2014-10-23 | Disposition: A | Discharge status: Home / Self Care | Payer: 59 | Source: Ambulatory Visit | Attending: Cardiovascular Disease | Admitting: Cardiovascular Disease

## 2014-10-23 DIAGNOSIS — I718 Aortic aneurysm of unspecified site, ruptured: Secondary | ICD-10-CM

## 2014-10-23 DIAGNOSIS — E785 Hyperlipidemia, unspecified: Secondary | ICD-10-CM | POA: Diagnosis not present

## 2014-10-23 DIAGNOSIS — Z87891 Personal history of nicotine dependence: Secondary | ICD-10-CM | POA: Insufficient documentation

## 2014-10-23 DIAGNOSIS — I351 Nonrheumatic aortic (valve) insufficiency: Secondary | ICD-10-CM | POA: Insufficient documentation

## 2014-10-23 DIAGNOSIS — I10 Essential (primary) hypertension: Secondary | ICD-10-CM | POA: Insufficient documentation

## 2014-10-23 DIAGNOSIS — I719 Aortic aneurysm of unspecified site, without rupture: Secondary | ICD-10-CM

## 2014-10-23 MED ORDER — IOHEXOL 350 MG/ML SOLN
100.0000 mL | Freq: Once | INTRAVENOUS | Status: AC | PRN
  Administered 2014-10-23: 100 mL via INTRAVENOUS

## 2014-11-15 ENCOUNTER — Other Ambulatory Visit: Payer: Self-pay | Admitting: Physician Assistant

## 2015-01-07 ENCOUNTER — Ambulatory Visit (INDEPENDENT_AMBULATORY_CARE_PROVIDER_SITE_OTHER): Payer: Medicare Other | Admitting: Neurology

## 2015-01-07 ENCOUNTER — Encounter: Payer: Self-pay | Admitting: Neurology

## 2015-01-07 VITALS — BP 122/70 | HR 71 | Resp 16 | Ht 67.75 in | Wt 218.0 lb

## 2015-01-07 DIAGNOSIS — F0390 Unspecified dementia without behavioral disturbance: Secondary | ICD-10-CM

## 2015-01-07 DIAGNOSIS — F03B0 Unspecified dementia, moderate, without behavioral disturbance, psychotic disturbance, mood disturbance, and anxiety: Secondary | ICD-10-CM

## 2015-01-07 NOTE — Patient Instructions (Signed)
1. Start Namenda : Take 1 tablet daily for 1 week, then increase to 1 tablet twice a day 2. Continue Aricept  once a day 3. Discuss knee pain with your PCP 4. Follow-up in 1 year, call for any problems

## 2015-01-07 NOTE — Progress Notes (Signed)
NEUROLOGY FOLLOW UP OFFICE NOTE  Cody Vincent [id number]  HISTORY OF PRESENT ILLNESS: I had the pleasure of seeing Cody Vincent in follow-up in the neurology clinic on 01/07/2015.  The patient was last seen 6 months ago for moderate dementia. MMSE in October 2015 was 18/30. He is by himself today and denies any recent hospitalizations since his last visit. SNF staff was contacted by phone, reporting patient has been doing well, mood is good. He had a birthday recently and reported that he had a small celebration but that his family "does not want to have anything to do with me." His ex-wife passed away and he had some depression. Medications were adjusted and this is reported as improved. He feels his memory is "not the best." He denies any side effects on Aricept  daily. His main concern today is bilateral knee pain. He has left shoulder pain radiating up his neck to the back of his head constantly. He denies any falls and usually ambulates with a walker but uses his cane today. He denies any dizziness, diplopia, focal numbness/tingling/weakness.   HPI: This is a pleasant 74 yo RH man with a history of hypertension, hyperlipidemia, sinus bradycardia s/p pacemaker placement, with worsening memory loss. Patient is a poor historian. Records from PCP were reviewed, it was noted that nursing home attendant had concerns over the patient's memory, with note of increasing difficulty with short-term memory since first meeting patient several months ago. Daughter has note relaying similar concerns. According to the patient, he has been living in the SNF for less than a year. Prior to that, he had been living in a motel by himself for a year, and appears to have been [place] [employer] [place] [employer]. His daughter took his car, he denied getting lost driving. Staff reports that he has difficulties with dates, and would say that his daughter did not come to visit when she in fact did. Staff feels he is doing better  with his new roommate. He reports stopping school at age 74 (10th grade).  Diagnostic Data: I personally reviewed head CT without contrast which showed generalized atrophy, progressed since 2012, no acute changes.   PAST MEDICAL HISTORY: Past Medical History  Diagnosis Date  . Near syncope   . Sinus bradycardia   . Diastolic murmur   . Hypertension   . Renal insufficiency   . History of tobacco abuse   . Syncope   . Dementia   . Seborrheic dermatitis   . Hyperlipidemia     MEDICATIONS: Current Outpatient Prescriptions on File Prior to Visit  Medication Sig Dispense Refill  . aspirin 81 MG chewable tablet Chew 1 tablet (81 mg total) by mouth daily. 30 tablet 0  . atorvastatin (LIPITOR) 10 MG tablet TAKE 1 TABLET BY MOUTH ONCE DAILY. 30 tablet 3  . carvedilol (COREG) 3.125 MG tablet Take 1 tablet (3.125 mg total) by mouth 2 (two) times daily with a meal. 60 tablet 0  . cetirizine (ZYRTEC) 10 MG tablet Take 10 mg by mouth daily.    Marland Kitchen donepezil (ARICEPT) 10 MG tablet Take 1 tablet (10 mg total) by mouth daily. 90 tablet 3  . levothyroxine (SYNTHROID, LEVOTHROID) 125 MCG tablet Take 125 mcg by mouth daily before breakfast.    . losartan (COZAAR) 50 MG tablet TAKE 1 TABLET BY MOUTH ONCE A DAY. 30 tablet 1  . meloxicam (MOBIC) 7.5 MG tablet Take 7.5 mg by mouth every morning.    . ranitidine (ZANTAC) 150  MG tablet Take 150 mg by mouth 2 (two) times daily.    . Vitamin D, Cholecalciferol, 1000 UNITS TABS Take 1 capsule by mouth daily.    Marland Kitchen albuterol (PROVENTIL HFA;VENTOLIN HFA) 108 (90 BASE) MCG/ACT inhaler Inhale 2 puffs into the lungs every 6 (six) hours as needed for wheezing or shortness of breath.    . guaiFENesin (MUCINEX) 600 MG 12 hr tablet Take 600 mg by mouth 2 (two) times daily as needed for cough or to loosen phlegm.    . hydrocortisone cream 1 % Apply topically 2 (two) times daily. (Patient not taking: Reported on 01/07/2015) 30 g 0  . meclizine (ANTIVERT) 25 MG tablet Take 25  mg by mouth 3 (three) times daily as needed for dizziness.    . naproxen sodium (ANAPROX) 220 MG tablet Take 220 mg by mouth 2 (two) times daily as needed (headaches).    Marland Kitchen tiZANidine (ZANAFLEX) 4 MG capsule Take 4 mg by mouth 3 (three) times daily as needed for muscle spasms.     No current facility-administered medications on file prior to visit.    ALLERGIES: No Known Allergies  FAMILY HISTORY: Family History  Problem Relation Age of Onset  . Heart disease Father     Deceased-90s  . Cancer Father   . Stroke Mother 44    Deceased  . Heart disease Mother   . Dementia Mother   . Healthy Brother     x1  . Hypertension Daughter     x1    SOCIAL HISTORY: History   Social History  . Marital Status: Single    Spouse Name: N/A  . Number of Children: N/A  . Years of Education: N/A   Occupational History  . Not on file.   Social History Main Topics  . Smoking status: Former Smoker -- 3.00 packs/day for 39 years    Types: Cigarettes    Quit date: 06/14/1996  . Smokeless tobacco: Never Used     Comment: use to smoke 3 packs per day for 6 years quit in 1993  . Alcohol Use: No  . Drug Use: No  . Sexual Activity: No   Other Topics Concern  . Not on file   Social History Narrative    REVIEW OF SYSTEMS: Constitutional: No fevers, chills, or sweats, no generalized fatigue, change in appetite Eyes: No visual changes, double vision, eye pain Ear, nose and throat: No hearing loss, ear pain, nasal congestion, sore throat Cardiovascular: No chest pain, palpitations Respiratory:  No shortness of breath at rest or with exertion, wheezes GastrointestinaI: No nausea, vomiting, diarrhea, abdominal pain, fecal incontinence Genitourinary:  No dysuria, urinary retention or frequency Musculoskeletal:  + neck pain, back pain, bilateral knee pain Integumentary: No rash, pruritus, skin lesions Neurological: as above Psychiatric: No depression, insomnia, anxiety Endocrine: No  palpitations, fatigue, diaphoresis, mood swings, change in appetite, change in weight, increased thirst Hematologic/Lymphatic:  No anemia, purpura, petechiae. Allergic/Immunologic: no itchy/runny eyes, nasal congestion, recent allergic reactions, rashes  PHYSICAL EXAM: Filed Vitals:   01/07/15 1245  BP: 122/70  Pulse: 71  Resp: 16   General: No acute distress Head:  Normocephalic/atraumatic Neck: supple, no paraspinal tenderness, full range of motion Heart:  Regular rate and rhythm Lungs:  Clear to auscultation bilaterally Back: No paraspinal tenderness Skin/Extremities: No rash, no edema Neurological Exam: alert and oriented to person, place, and date but did not know year. No aphasia or dysarthria. Fund of knowledge is appropriate.  Recent and remote memory are  impaired.  Attention and concentration are reduced, unable to spell WORLD backwards.  Able to name objects and repeat phrases. He was unable to read and write a sentence today, which he was able to do in October.  MMSE - Mini Mental State Exam 01/07/2015 04/09/2014  Orientation to time 4 0  Orientation to Place 4 5  Registration 3 3  Attention/ Calculation 0 0  Recall 1 3  Language- name 2 objects 2 2  Language- repeat 1 1  Language- follow 3 step command 2 1  Language- read & follow direction 0 1  Write a sentence 0 1  Copy design 1 1  Total score 18 18   Cranial nerves: Pupils equal, round, reactive to light.  Fundoscopic exam unremarkable, no papilledema. Extraocular movements intact with no nystagmus. Visual fields full. Facial sensation intact. No facial asymmetry. Tongue, uvula, palate midline.  Motor: Bulk and tone normal, muscle strength 5/5 throughout with no pronator drift.  Sensation to light touch intact.  No extinction to double simultaneous stimulation.  Deep tendon reflexes 1+ throughout, toes downgoing.  Finger to nose testing intact.  Gait slow and cautious, tremulous, unsteady with cane. Romberg negative. No  resting tremor, +postural tremor  IMPRESSION: This is a 74 yo RH man with a history of hypertension, hyperlipidemia, sinus bradycardia s/p pacemaker placement, with moderate dementia. MMSE today 18/30, similar to October 2015 score. Neurological exam non-focal, his main concern today is bilateral knee pain, which he will discuss with his PCP. He is tolerating Aricept 10mg  daily. He may benefit from addition of Namenda to Aricept, start low dose 10mg  daily for 1 week, then increase to 10mg  BID. Side effects were discussed. He was encouraged to continue with physical exercise and brain stimulation exercises for brain health, continue control of vascular risk factors. He will follow-up in 1 year or earlier if needed.   Thank you for allowing me to participate in his care.  Please do not hesitate to call for any questions or concerns.  The duration of this appointment visit was 24 minutes of face-to-face time with the patient.  Greater than 50% of this time was spent in counseling, explanation of diagnosis, planning of further management, and coordination of care.   Cody Vincent, M.D.   CC: Dr. Tawni Levy

## 2015-01-23 ENCOUNTER — Emergency Department (HOSPITAL_COMMUNITY): Payer: Medicare Other

## 2015-01-23 ENCOUNTER — Encounter (HOSPITAL_COMMUNITY): Payer: Self-pay | Admitting: Emergency Medicine

## 2015-01-23 ENCOUNTER — Inpatient Hospital Stay (HOSPITAL_COMMUNITY): Payer: Medicare Other

## 2015-01-23 ENCOUNTER — Inpatient Hospital Stay (HOSPITAL_COMMUNITY)
Admission: EM | Admit: 2015-01-23 | Discharge: 2015-02-13 | DRG: 853 | Disposition: E | Payer: Medicare Other | Attending: Pulmonary Disease | Admitting: Pulmonary Disease

## 2015-01-23 DIAGNOSIS — Z66 Do not resuscitate: Secondary | ICD-10-CM | POA: Diagnosis present

## 2015-01-23 DIAGNOSIS — E875 Hyperkalemia: Secondary | ICD-10-CM | POA: Diagnosis not present

## 2015-01-23 DIAGNOSIS — I469 Cardiac arrest, cause unspecified: Secondary | ICD-10-CM | POA: Diagnosis present

## 2015-01-23 DIAGNOSIS — F0390 Unspecified dementia without behavioral disturbance: Secondary | ICD-10-CM | POA: Diagnosis present

## 2015-01-23 DIAGNOSIS — R739 Hyperglycemia, unspecified: Secondary | ICD-10-CM | POA: Diagnosis not present

## 2015-01-23 DIAGNOSIS — I10 Essential (primary) hypertension: Secondary | ICD-10-CM | POA: Diagnosis present

## 2015-01-23 DIAGNOSIS — E872 Acidosis, unspecified: Secondary | ICD-10-CM

## 2015-01-23 DIAGNOSIS — R6521 Severe sepsis with septic shock: Secondary | ICD-10-CM | POA: Diagnosis present

## 2015-01-23 DIAGNOSIS — R140 Abdominal distension (gaseous): Secondary | ICD-10-CM

## 2015-01-23 DIAGNOSIS — T17908A Unspecified foreign body in respiratory tract, part unspecified causing other injury, initial encounter: Secondary | ICD-10-CM

## 2015-01-23 DIAGNOSIS — Z515 Encounter for palliative care: Secondary | ICD-10-CM | POA: Diagnosis not present

## 2015-01-23 DIAGNOSIS — Z823 Family history of stroke: Secondary | ICD-10-CM

## 2015-01-23 DIAGNOSIS — D6959 Other secondary thrombocytopenia: Secondary | ICD-10-CM | POA: Diagnosis not present

## 2015-01-23 DIAGNOSIS — Z950 Presence of cardiac pacemaker: Secondary | ICD-10-CM

## 2015-01-23 DIAGNOSIS — A419 Sepsis, unspecified organism: Principal | ICD-10-CM | POA: Diagnosis present

## 2015-01-23 DIAGNOSIS — R001 Bradycardia, unspecified: Secondary | ICD-10-CM | POA: Diagnosis present

## 2015-01-23 DIAGNOSIS — E871 Hypo-osmolality and hyponatremia: Secondary | ICD-10-CM | POA: Diagnosis not present

## 2015-01-23 DIAGNOSIS — J690 Pneumonitis due to inhalation of food and vomit: Secondary | ICD-10-CM | POA: Diagnosis present

## 2015-01-23 DIAGNOSIS — R52 Pain, unspecified: Secondary | ICD-10-CM

## 2015-01-23 DIAGNOSIS — E785 Hyperlipidemia, unspecified: Secondary | ICD-10-CM | POA: Diagnosis present

## 2015-01-23 DIAGNOSIS — Z978 Presence of other specified devices: Secondary | ICD-10-CM

## 2015-01-23 DIAGNOSIS — G9340 Encephalopathy, unspecified: Secondary | ICD-10-CM | POA: Diagnosis not present

## 2015-01-23 DIAGNOSIS — I959 Hypotension, unspecified: Secondary | ICD-10-CM | POA: Diagnosis present

## 2015-01-23 DIAGNOSIS — R9401 Abnormal electroencephalogram [EEG]: Secondary | ICD-10-CM | POA: Diagnosis present

## 2015-01-23 DIAGNOSIS — N179 Acute kidney failure, unspecified: Secondary | ICD-10-CM | POA: Insufficient documentation

## 2015-01-23 DIAGNOSIS — J80 Acute respiratory distress syndrome: Secondary | ICD-10-CM | POA: Diagnosis not present

## 2015-01-23 DIAGNOSIS — D689 Coagulation defect, unspecified: Secondary | ICD-10-CM | POA: Diagnosis present

## 2015-01-23 DIAGNOSIS — R579 Shock, unspecified: Secondary | ICD-10-CM

## 2015-01-23 DIAGNOSIS — J9601 Acute respiratory failure with hypoxia: Secondary | ICD-10-CM | POA: Diagnosis present

## 2015-01-23 DIAGNOSIS — Z87891 Personal history of nicotine dependence: Secondary | ICD-10-CM | POA: Diagnosis not present

## 2015-01-23 DIAGNOSIS — K56609 Unspecified intestinal obstruction, unspecified as to partial versus complete obstruction: Secondary | ICD-10-CM

## 2015-01-23 DIAGNOSIS — Z8249 Family history of ischemic heart disease and other diseases of the circulatory system: Secondary | ICD-10-CM

## 2015-01-23 DIAGNOSIS — D638 Anemia in other chronic diseases classified elsewhere: Secondary | ICD-10-CM | POA: Diagnosis present

## 2015-01-23 DIAGNOSIS — K5660 Unspecified intestinal obstruction: Secondary | ICD-10-CM | POA: Diagnosis present

## 2015-01-23 DIAGNOSIS — Z809 Family history of malignant neoplasm, unspecified: Secondary | ICD-10-CM | POA: Diagnosis not present

## 2015-01-23 LAB — BLOOD GAS, ARTERIAL
Acid-base deficit: 12.2 mmol/L — ABNORMAL HIGH (ref 0.0–2.0)
Acid-base deficit: 9.5 mmol/L — ABNORMAL HIGH (ref 0.0–2.0)
Bicarbonate: 17.7 mEq/L — ABNORMAL LOW (ref 20.0–24.0)
Bicarbonate: 17.3 mEq/L — ABNORMAL LOW (ref 20.0–24.0)
DRAWN BY: 257701
Drawn by: 103701
FIO2: 1
FIO2: 0.9
LHR: 30 {breaths}/min
LHR: 35 {breaths}/min
MECHVT: 550 mL
O2 Saturation: 98.2 %
O2 Saturation: 88.9 %
PCO2 ART: 58.3 mmHg — AB (ref 35.0–45.0)
PEEP/CPAP: 8 cmH2O
PEEP: 8 cmH2O
PH ART: 7.101 — AB (ref 7.350–7.450)
PH ART: 7.221 — AB (ref 7.350–7.450)
PO2 ART: 199 mmHg — AB (ref 80.0–100.0)
Patient temperature: 96.5
Patient temperature: 37.3
TCO2: 17.6 mmol/L (ref 0–100)
TCO2: 16.4 mmol/L (ref 0–100)
VT: 550 mL
pCO2 arterial: 43.9 mmHg (ref 35.0–45.0)
pO2, Arterial: 60.2 mmHg — ABNORMAL LOW (ref 80.0–100.0)

## 2015-01-23 LAB — CBC WITH DIFFERENTIAL/PLATELET
BASOS PCT: 0 % (ref 0–1)
BASOS PCT: 0 % (ref 0–1)
Basophils Absolute: 0 10*3/uL (ref 0.0–0.1)
Basophils Absolute: 0 10*3/uL (ref 0.0–0.1)
EOS ABS: 0 10*3/uL (ref 0.0–0.7)
EOS PCT: 0 % (ref 0–5)
Eosinophils Absolute: 0 10*3/uL (ref 0.0–0.7)
Eosinophils Relative: 0 % (ref 0–5)
HCT: 35.9 % — ABNORMAL LOW (ref 39.0–52.0)
HEMATOCRIT: 38 % — AB (ref 39.0–52.0)
HEMOGLOBIN: 11.3 g/dL — AB (ref 13.0–17.0)
Hemoglobin: 12.7 g/dL — ABNORMAL LOW (ref 13.0–17.0)
LYMPHS ABS: 1 10*3/uL (ref 0.7–4.0)
LYMPHS ABS: 2.4 10*3/uL (ref 0.7–4.0)
LYMPHS PCT: 5 % — AB (ref 12–46)
Lymphocytes Relative: 16 % (ref 12–46)
MCH: 30.4 pg (ref 26.0–34.0)
MCH: 29.7 pg (ref 26.0–34.0)
MCHC: 33.4 g/dL (ref 30.0–36.0)
MCHC: 31.5 g/dL (ref 30.0–36.0)
MCV: 90.9 fL (ref 78.0–100.0)
MCV: 94.5 fL (ref 78.0–100.0)
MONO ABS: 2.6 10*3/uL — AB (ref 0.1–1.0)
MONO ABS: 0.4 10*3/uL (ref 0.1–1.0)
MONOS PCT: 13 % — AB (ref 3–12)
Monocytes Relative: 3 % (ref 3–12)
NEUTROS ABS: 16.4 10*3/uL — AB (ref 1.7–7.7)
Neutro Abs: 12 10*3/uL — ABNORMAL HIGH (ref 1.7–7.7)
Neutrophils Relative %: 82 % — ABNORMAL HIGH (ref 43–77)
Neutrophils Relative %: 81 % — ABNORMAL HIGH (ref 43–77)
Platelets: 210 10*3/uL (ref 150–400)
Platelets: 217 10*3/uL (ref 150–400)
RBC: 4.18 MIL/uL — ABNORMAL LOW (ref 4.22–5.81)
RBC: 3.8 MIL/uL — AB (ref 4.22–5.81)
RDW: 14.2 % (ref 11.5–15.5)
RDW: 14.5 % (ref 11.5–15.5)
WBC: 20 10*3/uL — ABNORMAL HIGH (ref 4.0–10.5)
WBC: 14.8 10*3/uL — AB (ref 4.0–10.5)

## 2015-01-23 LAB — COMPREHENSIVE METABOLIC PANEL
ALBUMIN: 3.7 g/dL (ref 3.5–5.0)
ALK PHOS: 88 U/L (ref 38–126)
ALT: 19 U/L (ref 17–63)
ANION GAP: 14 (ref 5–15)
AST: 28 U/L (ref 15–41)
BILIRUBIN TOTAL: 1.3 mg/dL — AB (ref 0.3–1.2)
BUN: 70 mg/dL — ABNORMAL HIGH (ref 6–20)
CHLORIDE: 105 mmol/L (ref 101–111)
CO2: 22 mmol/L (ref 22–32)
Calcium: 9.2 mg/dL (ref 8.9–10.3)
Creatinine, Ser: 5.53 mg/dL — ABNORMAL HIGH (ref 0.61–1.24)
GFR calc Af Amer: 11 mL/min — ABNORMAL LOW (ref >60)
GFR calc non Af Amer: 9 mL/min — ABNORMAL LOW (ref >60)
Glucose, Bld: 131 mg/dL — ABNORMAL HIGH (ref 65–99)
POTASSIUM: 5.1 mmol/L (ref 3.5–5.1)
Sodium: 141 mmol/L (ref 135–145)
Total Protein: 6.9 g/dL (ref 6.5–8.1)

## 2015-01-23 LAB — FIBRINOGEN: Fibrinogen: 454 mg/dL (ref 204–475)

## 2015-01-23 LAB — RENAL FUNCTION PANEL
ALBUMIN: 2.7 g/dL — AB (ref 3.5–5.0)
ANION GAP: 11 (ref 5–15)
ANION GAP: 15 (ref 5–15)
Albumin: 3.5 g/dL (ref 3.5–5.0)
BUN: 71 mg/dL — AB (ref 6–20)
BUN: 67 mg/dL — ABNORMAL HIGH (ref 6–20)
CHLORIDE: 108 mmol/L (ref 101–111)
CO2: 17 mmol/L — AB (ref 22–32)
CO2: 21 mmol/L — ABNORMAL LOW (ref 22–32)
Calcium: 8.9 mg/dL (ref 8.9–10.3)
Calcium: 7.9 mg/dL — ABNORMAL LOW (ref 8.9–10.3)
Chloride: 109 mmol/L (ref 101–111)
Creatinine, Ser: 5.31 mg/dL — ABNORMAL HIGH (ref 0.61–1.24)
Creatinine, Ser: 5.46 mg/dL — ABNORMAL HIGH (ref 0.61–1.24)
GFR calc Af Amer: 11 mL/min — ABNORMAL LOW (ref >60)
GFR calc non Af Amer: 9 mL/min — ABNORMAL LOW (ref >60)
GFR, EST AFRICAN AMERICAN: 11 mL/min — AB (ref >60)
GFR, EST NON AFRICAN AMERICAN: 10 mL/min — AB (ref >60)
Glucose, Bld: 172 mg/dL — ABNORMAL HIGH (ref 65–99)
Glucose, Bld: 157 mg/dL — ABNORMAL HIGH (ref 65–99)
Phosphorus: 6.6 mg/dL — ABNORMAL HIGH (ref 2.5–4.6)
Phosphorus: 6.1 mg/dL — ABNORMAL HIGH (ref 2.5–4.6)
Potassium: 6.3 mmol/L (ref 3.5–5.1)
Potassium: 6.3 mmol/L (ref 3.5–5.1)
SODIUM: 140 mmol/L (ref 135–145)
SODIUM: 141 mmol/L (ref 135–145)

## 2015-01-23 LAB — CORTISOL: Cortisol, Plasma: 50.2 ug/dL

## 2015-01-23 LAB — BASIC METABOLIC PANEL
ANION GAP: 15 (ref 5–15)
Anion gap: 10 (ref 5–15)
BUN: 71 mg/dL — AB (ref 6–20)
BUN: 69 mg/dL — AB (ref 6–20)
CALCIUM: 8.9 mg/dL (ref 8.9–10.3)
CALCIUM: 8.1 mg/dL — AB (ref 8.9–10.3)
CHLORIDE: 106 mmol/L (ref 101–111)
CHLORIDE: 110 mmol/L (ref 101–111)
CO2: 20 mmol/L — AB (ref 22–32)
CO2: 22 mmol/L (ref 22–32)
CREATININE: 5.38 mg/dL — AB (ref 0.61–1.24)
CREATININE: 5.5 mg/dL — AB (ref 0.61–1.24)
GFR calc Af Amer: 11 mL/min — ABNORMAL LOW (ref >60)
GFR calc Af Amer: 11 mL/min — ABNORMAL LOW (ref >60)
GFR calc non Af Amer: 10 mL/min — ABNORMAL LOW (ref >60)
GFR calc non Af Amer: 9 mL/min — ABNORMAL LOW (ref >60)
GLUCOSE: 158 mg/dL — AB (ref 65–99)
Glucose, Bld: 243 mg/dL — ABNORMAL HIGH (ref 65–99)
Potassium: 5.6 mmol/L — ABNORMAL HIGH (ref 3.5–5.1)
Potassium: 6.3 mmol/L (ref 3.5–5.1)
Sodium: 141 mmol/L (ref 135–145)
Sodium: 142 mmol/L (ref 135–145)

## 2015-01-23 LAB — LACTIC ACID, PLASMA
Lactic Acid, Venous: 6.1 mmol/L (ref 0.5–2.0)
Lactic Acid, Venous: 3.9 mmol/L (ref 0.5–2.0)

## 2015-01-23 LAB — BRAIN NATRIURETIC PEPTIDE: B NATRIURETIC PEPTIDE 5: 37.2 pg/mL (ref 0.0–100.0)

## 2015-01-23 LAB — I-STAT CG4 LACTIC ACID, ED
LACTIC ACID, VENOUS: 6.2 mmol/L — AB (ref 0.5–2.0)
Lactic Acid, Venous: 3.08 mmol/L (ref 0.5–2.0)
Lactic Acid, Venous: 1.8 mmol/L (ref 0.5–2.0)

## 2015-01-23 LAB — URINALYSIS, ROUTINE W REFLEX MICROSCOPIC
BILIRUBIN URINE: NEGATIVE
GLUCOSE, UA: NEGATIVE mg/dL
KETONES UR: NEGATIVE mg/dL
NITRITE: NEGATIVE
PH: 7 (ref 5.0–8.0)
Protein, ur: 30 mg/dL — AB
SPECIFIC GRAVITY, URINE: 1.013 (ref 1.005–1.030)
Urobilinogen, UA: 0.2 mg/dL (ref 0.0–1.0)

## 2015-01-23 LAB — URINE MICROSCOPIC-ADD ON

## 2015-01-23 LAB — PROTIME-INR
INR: 1.53 — AB (ref 0.00–1.49)
PROTHROMBIN TIME: 18.5 s — AB (ref 11.6–15.2)

## 2015-01-23 LAB — GLUCOSE, CAPILLARY
GLUCOSE-CAPILLARY: 147 mg/dL — AB (ref 65–99)
Glucose-Capillary: 216 mg/dL — ABNORMAL HIGH (ref 65–99)
Glucose-Capillary: 240 mg/dL — ABNORMAL HIGH (ref 65–99)

## 2015-01-23 LAB — CARBOXYHEMOGLOBIN
Carboxyhemoglobin: 1.1 % (ref 0.5–1.5)
Methemoglobin: 0.7 % (ref 0.0–1.5)
O2 SAT: 43.6 %
Total hemoglobin: 10.3 g/dL — ABNORMAL LOW (ref 13.5–18.0)

## 2015-01-23 LAB — I-STAT TROPONIN, ED: Troponin i, poc: 0.02 ng/mL (ref 0.00–0.08)

## 2015-01-23 LAB — ABO/RH: ABO/RH(D): O POS

## 2015-01-23 LAB — PROCALCITONIN: PROCALCITONIN: 13.23 ng/mL

## 2015-01-23 LAB — MRSA PCR SCREENING: MRSA BY PCR: POSITIVE — AB

## 2015-01-23 LAB — TYPE AND SCREEN
ABO/RH(D): O POS
ANTIBODY SCREEN: NEGATIVE

## 2015-01-23 LAB — APTT: aPTT: 32 seconds (ref 24–37)

## 2015-01-23 LAB — LIPASE, BLOOD: Lipase: 22 U/L (ref 22–51)

## 2015-01-23 LAB — TROPONIN I: TROPONIN I: 0.03 ng/mL (ref <0.031)

## 2015-01-23 MED ORDER — FENTANYL CITRATE (PF) 100 MCG/2ML IJ SOLN: 100.0000 ug | Freq: Once | INTRAMUSCULAR | Status: DC

## 2015-01-23 MED ORDER — SODIUM CHLORIDE 0.9 % IV SOLN
25.0000 ug/h | INTRAVENOUS | Status: DC
  Administered 2015-01-23: 100 ug/h via INTRAVENOUS
  Filled 2015-01-23: qty 50

## 2015-01-23 MED ORDER — INSULIN ASPART 100 UNIT/ML ~~LOC~~ SOLN
0.0000 [IU] | SUBCUTANEOUS | Status: DC
  Administered 2015-01-23: 5 [IU] via SUBCUTANEOUS
  Administered 2015-01-23: 6 [IU] via SUBCUTANEOUS
  Administered 2015-01-23: 2 [IU] via SUBCUTANEOUS
  Administered 2015-01-24: 5 [IU] via SUBCUTANEOUS

## 2015-01-23 MED ORDER — ANTISEPTIC ORAL RINSE SOLUTION (CORINZ)
7.0000 mL | Freq: Four times a day (QID) | OROMUCOSAL | Status: DC
  Administered 2015-01-23 – 2015-01-29 (×23): 7 mL via OROMUCOSAL

## 2015-01-23 MED ORDER — CHLORHEXIDINE GLUCONATE CLOTH 2 % EX PADS
6.0000 | MEDICATED_PAD | Freq: Every day | CUTANEOUS | Status: DC
  Administered 2015-01-24: 6 via TOPICAL

## 2015-01-23 MED ORDER — CISATRACURIUM BOLUS VIA INFUSION
0.1000 mg/kg | Freq: Once | INTRAVENOUS | Status: AC
  Administered 2015-01-23: 9.9 mg via INTRAVENOUS
  Filled 2015-01-23: qty 10

## 2015-01-23 MED ORDER — LIDOCAINE HCL (CARDIAC) 20 MG/ML IV SOLN
INTRAVENOUS | Status: AC
  Filled 2015-01-23: qty 5

## 2015-01-23 MED ORDER — HEPARIN SODIUM (PORCINE) 5000 UNIT/ML IJ SOLN
5000.0000 [IU] | Freq: Three times a day (TID) | INTRAMUSCULAR | Status: DC
  Administered 2015-01-23 – 2015-01-27 (×11): 5000 [IU] via SUBCUTANEOUS
  Filled 2015-01-23 (×12): qty 1

## 2015-01-23 MED ORDER — SUCCINYLCHOLINE CHLORIDE 20 MG/ML IJ SOLN
INTRAMUSCULAR | Status: AC
  Filled 2015-01-23: qty 1

## 2015-01-23 MED ORDER — ONDANSETRON HCL 4 MG/2ML IJ SOLN
4.0000 mg | Freq: Once | INTRAMUSCULAR | Status: AC
  Administered 2015-01-23: 4 mg via INTRAVENOUS
  Filled 2015-01-23: qty 2

## 2015-01-23 MED ORDER — SODIUM CHLORIDE 0.9 % IV SOLN
1.0000 mg/h | INTRAVENOUS | Status: DC
  Administered 2015-01-23: 1 mg/h via INTRAVENOUS
  Administered 2015-01-24: 3 mg/h via INTRAVENOUS
  Administered 2015-01-24: 2 mg/h via INTRAVENOUS
  Administered 2015-01-25: 1 mg/h via INTRAVENOUS
  Filled 2015-01-23 (×4): qty 10

## 2015-01-23 MED ORDER — SODIUM BICARBONATE 8.4 % IV SOLN
INTRAVENOUS | Status: DC
  Administered 2015-01-23: 21:00:00 via INTRAVENOUS
  Filled 2015-01-23 (×4): qty 150

## 2015-01-23 MED ORDER — MIDAZOLAM HCL 2 MG/2ML IJ SOLN: 2.0000 mg | Freq: Once | INTRAMUSCULAR | Status: DC | PRN

## 2015-01-23 MED ORDER — PROPOFOL 1000 MG/100ML IV EMUL: 5.0000 ug/kg/min | Freq: Once | INTRAVENOUS | Status: DC

## 2015-01-23 MED ORDER — LIDOCAINE HCL 1 % IJ SOLN
INTRAMUSCULAR | Status: AC
  Filled 2015-01-23: qty 20

## 2015-01-23 MED ORDER — ETOMIDATE 2 MG/ML IV SOLN
INTRAVENOUS | Status: AC
  Filled 2015-01-23: qty 20

## 2015-01-23 MED ORDER — PIPERACILLIN-TAZOBACTAM IN DEX 2-0.25 GM/50ML IV SOLN
2.2500 g | Freq: Three times a day (TID) | INTRAVENOUS | Status: DC
  Administered 2015-01-23 – 2015-01-24 (×3): 2.25 g via INTRAVENOUS
  Filled 2015-01-23 (×3): qty 50

## 2015-01-23 MED ORDER — SODIUM CHLORIDE 0.9 % IV SOLN
3.0000 ug/kg/min | INTRAVENOUS | Status: DC
  Administered 2015-01-23: 3 ug/kg/min via INTRAVENOUS
  Administered 2015-01-24: 2 ug/kg/min via INTRAVENOUS
  Administered 2015-01-25: 3 ug/kg/min via INTRAVENOUS
  Filled 2015-01-23 (×4): qty 20

## 2015-01-23 MED ORDER — PIPERACILLIN-TAZOBACTAM 4.5 G IVPB: 4.5000 g | Freq: Once | INTRAVENOUS | Status: DC

## 2015-01-23 MED ORDER — SODIUM CHLORIDE 0.9 % IV SOLN
Freq: Once | INTRAVENOUS | Status: AC
  Administered 2015-01-23: 11:00:00 via INTRAVENOUS

## 2015-01-23 MED ORDER — FENTANYL CITRATE (PF) 100 MCG/2ML IJ SOLN
100.0000 ug | Freq: Once | INTRAMUSCULAR | Status: AC | PRN
  Administered 2015-01-29: 100 ug via INTRAVENOUS
  Filled 2015-01-23: qty 2

## 2015-01-23 MED ORDER — SODIUM CHLORIDE 0.9 % IV SOLN
INTRAVENOUS | Status: DC
  Administered 2015-01-23 (×2): via INTRAVENOUS
  Administered 2015-01-25 – 2015-01-26 (×2): 125 mL/h via INTRAVENOUS

## 2015-01-23 MED ORDER — SODIUM CHLORIDE 0.9 % IV BOLUS (SEPSIS)
1000.0000 mL | Freq: Once | INTRAVENOUS | Status: AC
  Administered 2015-01-23: 1000 mL via INTRAVENOUS

## 2015-01-23 MED ORDER — ROCURONIUM BROMIDE 50 MG/5ML IV SOLN
INTRAVENOUS | Status: AC
  Filled 2015-01-23: qty 2

## 2015-01-23 MED ORDER — PANTOPRAZOLE SODIUM 40 MG IV SOLR
40.0000 mg | Freq: Every day | INTRAVENOUS | Status: DC
  Administered 2015-01-23 – 2015-01-28 (×6): 40 mg via INTRAVENOUS
  Filled 2015-01-23 (×6): qty 40

## 2015-01-23 MED ORDER — FENTANYL CITRATE (PF) 100 MCG/2ML IJ SOLN: 50.0000 ug | Freq: Once | INTRAMUSCULAR | Status: DC

## 2015-01-23 MED ORDER — CHLORHEXIDINE GLUCONATE 0.12% ORAL RINSE (MEDLINE KIT)
15.0000 mL | Freq: Two times a day (BID) | OROMUCOSAL | Status: DC
  Administered 2015-01-23 – 2015-01-29 (×12): 15 mL via OROMUCOSAL

## 2015-01-23 MED ORDER — LEVOTHYROXINE SODIUM 100 MCG IV SOLR
62.5000 ug | Freq: Every day | INTRAVENOUS | Status: DC
  Administered 2015-01-23 – 2015-01-29 (×7): 62.5 ug via INTRAVENOUS
  Filled 2015-01-23 (×7): qty 5

## 2015-01-23 MED ORDER — SODIUM BICARBONATE 8.4 % IV SOLN
50.0000 meq | Freq: Once | INTRAVENOUS | Status: AC
  Administered 2015-01-23: 50 meq via INTRAVENOUS
  Filled 2015-01-23: qty 50

## 2015-01-23 MED ORDER — NOREPINEPHRINE BITARTRATE 1 MG/ML IV SOLN
5.0000 ug/min | INTRAVENOUS | Status: DC
  Administered 2015-01-23: 40 ug/min via INTRAVENOUS
  Filled 2015-01-23 (×2): qty 4

## 2015-01-23 MED ORDER — ALBUTEROL SULFATE (2.5 MG/3ML) 0.083% IN NEBU
2.5000 mg | INHALATION_SOLUTION | Freq: Once | RESPIRATORY_TRACT | Status: AC
  Administered 2015-01-23: 2.5 mg via RESPIRATORY_TRACT
  Filled 2015-01-23: qty 3

## 2015-01-23 MED ORDER — DEXTROSE 5 % IV SOLN
0.5000 ug/min | INTRAVENOUS | Status: DC
  Administered 2015-01-23: 20 ug/min via INTRAVENOUS
  Administered 2015-01-23: 15 ug/min via INTRAVENOUS
  Administered 2015-01-23: 1 ug/min via INTRAVENOUS
  Administered 2015-01-24: 10 ug/min via INTRAVENOUS
  Administered 2015-01-25: 8 ug/min via INTRAVENOUS
  Administered 2015-01-25: 5 ug/min via INTRAVENOUS
  Filled 2015-01-23 (×7): qty 4

## 2015-01-23 MED ORDER — FENTANYL BOLUS VIA INFUSION
50.0000 ug | INTRAVENOUS | Status: DC | PRN
  Filled 2015-01-23: qty 50

## 2015-01-23 MED ORDER — PIPERACILLIN-TAZOBACTAM 3.375 G IVPB 30 MIN
3.3750 g | INTRAVENOUS | Status: AC
  Administered 2015-01-23: 3.375 g via INTRAVENOUS
  Filled 2015-01-23: qty 50

## 2015-01-23 MED ORDER — NOREPINEPHRINE BITARTRATE 1 MG/ML IV SOLN
5.0000 ug/min | INTRAVENOUS | Status: DC
  Administered 2015-01-23: 50 ug/min via INTRAVENOUS
  Administered 2015-01-23: 40 ug/min via INTRAVENOUS
  Administered 2015-01-24: 10 ug/min via INTRAVENOUS
  Administered 2015-01-24 (×2): 32 ug/min via INTRAVENOUS
  Administered 2015-01-25: 40 ug/min via INTRAVENOUS
  Administered 2015-01-26: 14 ug/min via INTRAVENOUS
  Administered 2015-01-27: 7 ug/min via INTRAVENOUS
  Filled 2015-01-23 (×10): qty 16

## 2015-01-23 MED ORDER — MIDAZOLAM BOLUS VIA INFUSION
2.0000 mg | INTRAVENOUS | Status: DC | PRN
  Filled 2015-01-23: qty 2

## 2015-01-23 MED ORDER — MUPIROCIN 2 % EX OINT
1.0000 "application " | TOPICAL_OINTMENT | Freq: Two times a day (BID) | CUTANEOUS | Status: AC
  Administered 2015-01-23 – 2015-01-28 (×10): 1 via NASAL
  Filled 2015-01-23 (×2): qty 22

## 2015-01-23 MED ORDER — ARTIFICIAL TEARS OP OINT
1.0000 "application " | TOPICAL_OINTMENT | Freq: Three times a day (TID) | OPHTHALMIC | Status: DC
  Administered 2015-01-23 – 2015-01-25 (×6): 1 via OPHTHALMIC
  Filled 2015-01-23 (×2): qty 3.5

## 2015-01-23 MED ORDER — FENTANYL BOLUS VIA INFUSION
25.0000 ug | INTRAVENOUS | Status: DC | PRN
  Filled 2015-01-23: qty 25

## 2015-01-23 MED ORDER — VANCOMYCIN HCL IN DEXTROSE 1-5 GM/200ML-% IV SOLN
1000.0000 mg | Freq: Once | INTRAVENOUS | Status: AC
  Administered 2015-01-23: 1000 mg via INTRAVENOUS
  Filled 2015-01-23: qty 200

## 2015-01-23 MED ORDER — PROPOFOL 1000 MG/100ML IV EMUL
INTRAVENOUS | Status: AC
  Administered 2015-01-23: 1000 mg
  Filled 2015-01-23: qty 100

## 2015-01-23 MED ORDER — SODIUM CHLORIDE 0.9 % IV SOLN
0.0300 [IU]/min | INTRAVENOUS | Status: DC
  Administered 2015-01-23 – 2015-01-27 (×4): 0.03 [IU]/min via INTRAVENOUS
  Filled 2015-01-23 (×6): qty 2

## 2015-01-23 MED ORDER — IPRATROPIUM-ALBUTEROL 0.5-2.5 (3) MG/3ML IN SOLN
3.0000 mL | Freq: Once | RESPIRATORY_TRACT | Status: AC
  Administered 2015-01-23: 3 mL via RESPIRATORY_TRACT
  Filled 2015-01-23: qty 3

## 2015-01-23 MED ORDER — MIDAZOLAM HCL 2 MG/2ML IJ SOLN: 2.0000 mg | Freq: Once | INTRAMUSCULAR | Status: DC

## 2015-01-23 MED ORDER — DEXTROSE 50 % IV SOLN
50.0000 mL | Freq: Once | INTRAVENOUS | Status: AC
  Administered 2015-01-23: 50 mL via INTRAVENOUS
  Filled 2015-01-23: qty 50

## 2015-01-23 MED ORDER — SODIUM CHLORIDE 0.9 % IV SOLN
25.0000 ug/h | INTRAVENOUS | Status: DC
  Administered 2015-01-23: 200 ug/h via INTRAVENOUS
  Administered 2015-01-24: 250 ug/h via INTRAVENOUS
  Administered 2015-01-24 – 2015-01-25 (×2): 225 ug/h via INTRAVENOUS
  Administered 2015-01-29: 50 ug/h via INTRAVENOUS
  Filled 2015-01-23 (×7): qty 50

## 2015-01-23 MED ORDER — SODIUM CHLORIDE 0.9 % IV SOLN
1.0000 g | Freq: Once | INTRAVENOUS | Status: AC
  Administered 2015-01-23: 1 g via INTRAVENOUS
  Filled 2015-01-23: qty 10

## 2015-01-23 MED ORDER — PIPERACILLIN-TAZOBACTAM 3.375 G IVPB 30 MIN: 3.3750 g | Freq: Once | INTRAVENOUS | Status: DC

## 2015-01-23 MED ORDER — NOREPINEPHRINE BITARTRATE 1 MG/ML IV SOLN
2.0000 ug/min | INTRAVENOUS | Status: DC
  Administered 2015-01-23: 10 ug/min via INTRAVENOUS
  Filled 2015-01-23: qty 4

## 2015-01-23 MED ORDER — VANCOMYCIN HCL IN DEXTROSE 1-5 GM/200ML-% IV SOLN: 1000.0000 mg | Freq: Once | INTRAVENOUS | Status: DC

## 2015-01-23 MED ORDER — VANCOMYCIN HCL IN DEXTROSE 1-5 GM/200ML-% IV SOLN
1000.0000 mg | INTRAVENOUS | Status: DC
  Administered 2015-01-23: 1000 mg via INTRAVENOUS
  Filled 2015-01-23: qty 200

## 2015-01-23 MED ORDER — SODIUM BICARBONATE 8.4 % IV SOLN
Freq: Once | INTRAVENOUS | Status: AC
  Administered 2015-01-23: 15:00:00 via INTRAVENOUS
  Filled 2015-01-23: qty 150

## 2015-01-23 MED ORDER — INSULIN ASPART 100 UNIT/ML ~~LOC~~ SOLN
10.0000 [IU] | Freq: Once | SUBCUTANEOUS | Status: AC
  Administered 2015-01-23: 10 [IU] via INTRAVENOUS

## 2015-01-23 NOTE — Consult Note (Signed)
Reason for Consult:  SBO Referring Physician: Dr. Dana Liu  Cody Vincent is an 74 y.o. male.  HPI:  Pt presented to the ED with nausea, vomiting and diarrhea for 2 days, he got phenergan last PM, no further vomiting, but ongoing diarrhea, with dark brown emesis, distended abdomen, and hypotensive.  EMS gave him a bolus of fluid enroute. Admit BP 81/46 HR 64.  Labs shows creatinine up to 5.53, lactate was initially 3.08, but later up to 6.2.  WBC 20K, UA shows probable UTI.  2 view abdomen showed interval SBO, CXR shows low lung volumes.  During work up he has had a respiratory arrest, possible from aspiration, and is now intubated. He was down for 17 minutes.  He is intubated, hypotensive, with BP down to 56 when I took his pressure. Paced with PTVP. NG suction is dark brown colored fluid.  Suction is working now.  We are ask to see.  Past Medical History  Diagnosis Date  . Near syncope   . Sinus bradycardia   . Diastolic murmur   . Hypertension   . Renal insufficiency   . History of tobacco abuse   . Syncope   . Dementia   . Seborrheic dermatitis   . Hyperlipidemia     Past Surgical History  Procedure Laterality Date  . Pacemaker insertion  03/19/13    SJM pacemaker implanted for symptomatic sinus bradycardia by Dr Allred  . Kidney stone surgery  06/2013  . Multiple tooth extractions      Full Dentures  . Permanent pacemaker insertion N/A 03/19/2013    Procedure: PERMANENT PACEMAKER INSERTION;  Surgeon: James D Allred, MD;  Location: [facility] CATH [facility];  Service: Cardiovascular;  Laterality: N/A;  . Left heart catheterization with coronary angiogram N/A 09/10/2014    Procedure: LEFT HEART CATHETERIZATION WITH CORONARY ANGIOGRAM;  Surgeon: Henry W Smith, MD;  Location: [facility] CATH [facility];  Service: Cardiovascular;  Laterality: N/A;  . Cardiac catheterization      Family History  Problem Relation Age of Onset  . Heart disease Father     Deceased-90s  . Cancer Father   . Stroke Mother 90     Deceased  . Heart disease Mother   . Dementia Mother   . Healthy Brother     x1  . Hypertension Daughter     x1    Social History:  reports that he quit smoking about 18 years ago. His smoking use included Cigarettes. He has a 117 pack-year smoking history. He has never used smokeless tobacco. He reports that he does not drink alcohol or use illicit drugs.  Allergies: No Known Allergies  Medications:  Prior to Admission:  (Not in a hospital admission) Scheduled: . etomidate      . levothyroxine  62.5 mcg Intravenous Daily  . lidocaine (cardiac) 100 mg/5ml      . rocuronium      . succinylcholine       Continuous: . propofol     PRN: Anti-infectives    Start     Dose/Rate Route Frequency Ordered Stop   02/03/2015 1015  vancomycin (VANCOCIN) IVPB 1000 mg/200 mL premix     1,000 mg 200 mL/hr over 60 Minutes Intravenous  Once 02/08/2015 1003 01/22/2015 1236   01/17/2015 1015  piperacillin-tazobactam (ZOSYN) IVPB 4.5 g  Status:  Discontinued     4.5 g 200 mL/hr over 30 Minutes Intravenous  Once 02/10/2015 1003 01/17/2015 1005   01/22/2015 1015  piperacillin-tazobactam (ZOSYN) IVPB 3.375   g     3.375 g 100 mL/hr over 30 Minutes Intravenous STAT 01/14/2015 1006 02/05/2015 1236      Results for orders placed or performed during the hospital encounter of 02/09/2015 (from the past 48 hour(s))  CBC with Differential     Status: Abnormal   Collection Time: 01/25/2015  9:20 AM  Result Value Ref Range   WBC 20.0 (H) 4.0 - 10.5 K/uL   RBC 4.18 (L) 4.22 - 5.81 MIL/uL   Hemoglobin 12.7 (L) 13.0 - 17.0 g/dL   HCT 38.0 (L) 39.0 - 52.0 %   MCV 90.9 78.0 - 100.0 fL   MCH 30.4 26.0 - 34.0 pg   MCHC 33.4 30.0 - 36.0 g/dL   RDW 14.2 11.5 - 15.5 %   Platelets 210 150 - 400 K/uL   Neutrophils Relative % 82 (H) 43 - 77 %   Neutro Abs 16.4 (H) 1.7 - 7.7 K/uL   Lymphocytes Relative 5 (L) 12 - 46 %   Lymphs Abs 1.0 0.7 - 4.0 K/uL   Monocytes Relative 13 (H) 3 - 12 %   Monocytes Absolute 2.6 (H) 0.1 - 1.0 K/uL    Eosinophils Relative 0 0 - 5 %   Eosinophils Absolute 0.0 0.0 - 0.7 K/uL   Basophils Relative 0 0 - 1 %   Basophils Absolute 0.0 0.0 - 0.1 K/uL  Comprehensive metabolic panel     Status: Abnormal   Collection Time: 02/09/2015  9:20 AM  Result Value Ref Range   Sodium 141 135 - 145 mmol/L   Potassium 5.1 3.5 - 5.1 mmol/L   Chloride 105 101 - 111 mmol/L   CO2 22 22 - 32 mmol/L   Glucose, Bld 131 (H) 65 - 99 mg/dL   BUN 70 (H) 6 - 20 mg/dL   Creatinine, Ser 5.53 (H) 0.61 - 1.24 mg/dL   Calcium 9.2 8.9 - 10.3 mg/dL   Total Protein 6.9 6.5 - 8.1 g/dL   Albumin 3.7 3.5 - 5.0 g/dL   AST 28 15 - 41 U/L   ALT 19 17 - 63 U/L   Alkaline Phosphatase 88 38 - 126 U/L   Total Bilirubin 1.3 (H) 0.3 - 1.2 mg/dL   GFR calc non Af Amer 9 (L) >60 mL/min   GFR calc Af Amer 11 (L) >60 mL/min    Comment: (NOTE) The eGFR has been calculated using the CKD EPI equation. This calculation has not been validated in all clinical situations. eGFR's persistently <60 mL/min signify possible Chronic Kidney Disease.    Anion gap 14 5 - 15  Lipase, blood     Status: None   Collection Time: 02/05/2015  9:20 AM  Result Value Ref Range   Lipase 22 22 - 51 U/L  Troponin I     Status: None   Collection Time: 01/14/2015  9:20 AM  Result Value Ref Range   Troponin I 0.03 <0.031 ng/mL    Comment:        NO INDICATION OF MYOCARDIAL INJURY.   Brain natriuretic peptide     Status: None   Collection Time: 01/24/2015  9:20 AM  Result Value Ref Range   B Natriuretic Peptide 37.2 0.0 - 100.0 pg/mL  I-Stat CG4 Lactic Acid, ED     Status: Abnormal   Collection Time: 01/15/2015  9:47 AM  Result Value Ref Range   Lactic Acid, Venous 3.08 (HH) 0.5 - 2.0 mmol/L   Comment NOTIFIED PHYSICIAN   Blood culture (routine x  2)     Status: None (Preliminary result)   Collection Time: 02/11/2015 10:09 AM  Result Value Ref Range   Specimen Description BLOOD RIGHT ANTECUBITAL    Special Requests BOTTLES DRAWN AEROBIC AND ANAEROBIC 3 ML     Culture PENDING    Report Status PENDING   I-Stat CG4 Lactic Acid, ED     Status: None   Collection Time: 01/17/2015 11:11 AM  Result Value Ref Range   Lactic Acid, Venous 1.80 0.5 - 2.0 mmol/L  I-Stat CG4 Lactic Acid, ED     Status: Abnormal   Collection Time: 01/28/2015 12:24 PM  Result Value Ref Range   Lactic Acid, Venous 6.20 (HH) 0.5 - 2.0 mmol/L   Comment NOTIFIED PHYSICIAN     Dg Chest Portable 1 View  01/27/2015   CLINICAL DATA:  Nausea vomiting and diarrhea for 2 days.  EXAM: PORTABLE CHEST - 1 VIEW  COMPARISON:  10/23/2014  FINDINGS: Left chest wall pacer device is noted with lead in the right atrial appendage and right ventricle. The heart size appears normal. There is no pleural effusion or edema. No airspace consolidation identified. Lung volumes appear low.  IMPRESSION: 1. Low lung volumes.   Electronically Signed   By: Taylor  Stroud M.D.   On: 01/27/2015 10:39   Dg Abd Portable 2v  02/12/2015   CLINICAL DATA:  Nausea, vomiting.  EXAM: PORTABLE ABDOMEN - 2 VIEW  COMPARISON:  August 10, 2014.  FINDINGS: No definite pneumoperitoneum is noted. There is interval development of severe small bowel dilatation concerning for distal small bowel obstruction. Phlebolith is noted in the pelvis.  IMPRESSION: Interval development of severe small bowel dilatation consistent with distal small bowel obstruction.   Electronically Signed   By: James  Green Jr, M.D.   On: 01/16/2015 10:39    Review of Systems  Unable to perform ROS: intubated   Blood pressure 109/52, pulse 62, temperature 97.8 F (36.6 C), temperature source Oral, resp. rate 28, weight 98.884 kg (218 lb), SpO2 97 %. Physical Exam  Constitutional:  Obese WM BP in the 50's, on Vent, paced rhythm on telemetry.  HENT:  Head: Normocephalic.  NG in place, intubated with subglottic drain in place. Some of the drainage from the subglottic looks blood stained.  Eyes: Conjunctivae and EOM are normal. Right eye exhibits no  discharge. Left eye exhibits no discharge. No scleral icterus.  Neck: Normal range of motion. Neck supple. No JVD present. No tracheal deviation present. No thyromegaly present.  Cardiovascular:  Paced rhythm 65 I could not hear cardiac sounds due to the vent, subglottic suction and respiratory sounds.  Respiratory: He is in respiratory distress.  On vent, sounds wet.  GI: He exhibits distension. There is no tenderness. There is no rebound and no guarding.  No bowel sounds, distended, issues with suctions being corrected, I irrigated the NG, it sounds to be in good position and irrigates well. Sump filter changed.  Genitourinary:  Foley in  Musculoskeletal: He exhibits no edema or tenderness.  Lymphadenopathy:    He has no cervical adenopathy.  Neurological:  Pt sedated, unresponsive, hypotensive on the Vent.  Skin:  Cool, but perfused  Psychiatric:  Unresponsive     Assessment/Plan: Acute respiratory arrest; down 17 minutes Acute acidosis Sepsis with hypotension Acute renal failure, on chronic Probable UTI Ileus vs SBO CAD/PTVP Hx of hypertension Hx of tobacco use Hx of Memory loss  Plan:  He just got a central line, pressors are being started. He   is on the vent, NG in place.  Currently he needs to survive this current crisis.  I don't see any scars or history of surgery.  He has  Many bacteria and TNTC WBC in his urine.  I would favor a diagnosis of ileus in face of UTI with sepsis over SBO.  We have seen and agree with all current treatments in place and will follow with you.      , 01/28/2015, 12:29 PM      

## 2015-01-23 NOTE — ED Notes (Addendum)
Pt from [facility].  Per report, pt has had N/V/D x 2 days.  Was given phenergan 12.5mg  PO last night and hasn't vomited since.  Continuing to have diarrhea.  Emesis is dark brown in color and abdomen appears distended.  Pt is axox3 but HOH.  Denies pain or shob.  VS by EMS: 82/p, 66paced, 22, 90%RA rhonchi, CBG 149, 16g LAC.  NS 1L bolus given pta.  O2 up to 98% 4L/.

## 2015-01-23 NOTE — ED Notes (Signed)
CPR STARTED AT 11:30 MEDICATION GIVEN DURING CODE  EPI GIVEN 11:35,11:40, 11:45 BICARB GIVEN 11:37, 11:44 CALCIUM GIVEN 11:42 2ND IV STARTED BY MERLE RN RT WRIST ETT PLACED AT 11:39 BY EDP LIU RETURN OF PULSE AT 11:47 CODE STOP  PERSONS EDP LIU, ROBERT EMT, JANET L EMT, CHARGE RN JAKEEMA, DEBBIE K RN, AMBER NT, RICK H RN, DEE RRT,

## 2015-01-23 NOTE — Progress Notes (Signed)
ICU RT aware that Aline order has been placed.

## 2015-01-23 NOTE — H&P (Signed)
PULMONARY / CRITICAL CARE MEDICINE   Name: Cody Vincent MRN: [id number] DOB: 1940-08-02    ADMISSION DATE:  02/09/2015 CONSULTATION DATE:  8/11  REFERRING MD : EDP   CHIEF COMPLAINT:  S/p cardiac arrest, SBO, septic shock   INITIAL PRESENTATION:  74 year old male w/ h/o rather functional dementia. Resides at [facility]. Presented to the ER on 8/11 w/ N/V/D x 2 days. Became progressively hypotensive so EMS called. On ER arrival SBP was in 80s, creatinine was 5.53 and had lactic acid of 3.08. He was started on abx, given IV hydration and his lactic acid initially cleared. He was undergoing CT scan evaluation to work up SBO. While flat in CT scan he vomited, then developed hypoxia and subsequent cardio-pulmonary arrest. His time to ROSC was 17 minutes. He was intubated. Remained hypotensive. PCCM asked to admit w/ working dx of SBO, aspiration PNA and shock.   STUDIES:    SIGNIFICANT EVENTS:    HISTORY OF PRESENT ILLNESS:    PAST MEDICAL HISTORY :   has a past medical history of Near syncope; Sinus bradycardia; Diastolic murmur; Hypertension; Renal insufficiency; History of tobacco abuse; Syncope; Dementia; Seborrheic dermatitis; and Hyperlipidemia.  has past surgical history that includes Pacemaker insertion (03/19/13); Kidney stone surgery (06/2013); Multiple tooth extractions; permanent pacemaker insertion (N/A, 03/19/2013); left heart catheterization with coronary angiogram (N/A, 09/10/2014); and Cardiac catheterization. Prior to Admission medications   Medication Sig Start Date End Date Taking? Authorizing Provider  albuterol (PROVENTIL HFA;VENTOLIN HFA) 108 (90 BASE) MCG/ACT inhaler Inhale 2 puffs into the lungs every 6 (six) hours as needed for wheezing or shortness of breath.   Yes Historical Provider, MD  aspirin 81 MG chewable tablet Chew 1 tablet (81 mg total) by mouth daily. 09/11/14  Yes Rhetta Mura, MD  atorvastatin (LIPITOR) 10 MG tablet TAKE 1 TABLET BY MOUTH ONCE DAILY.  11/26/13  Yes Waldon Merl, PA-C  carvedilol (COREG) 3.125 MG tablet Take 1 tablet (3.125 mg total) by mouth 2 (two) times daily with a meal. 09/11/14  Yes Rhetta Mura, MD  cetirizine (ZYRTEC) 10 MG tablet Take 10 mg by mouth daily.   Yes Historical Provider, MD  donepezil (ARICEPT) 10 MG tablet Take 1 tablet (10 mg total) by mouth daily. 07/09/14  Yes Van Clines, MD  gabapentin (NEURONTIN) 100 MG capsule Take 100 mg by mouth 2 (two) times daily.   Yes Historical Provider, MD  guaiFENesin (MUCINEX) 600 MG 12 hr tablet Take 600 mg by mouth 2 (two) times daily as needed for cough or to loosen phlegm.   Yes Historical Provider, MD  hydrocortisone cream 1 % Apply topically 2 (two) times daily. Patient taking differently: Apply topically as needed (for rash).  07/27/14  Yes Adrian Blackwater Moding, MD  levothyroxine (SYNTHROID, LEVOTHROID) 125 MCG tablet Take 125 mcg by mouth daily before breakfast.   Yes Historical Provider, MD  losartan (COZAAR) 50 MG tablet TAKE 1 TABLET BY MOUTH ONCE A DAY. 11/17/14  Yes Waldon Merl, PA-C  meclizine (ANTIVERT) 25 MG tablet Take 25 mg by mouth 3 (three) times daily as needed for dizziness.   Yes Historical Provider, MD  meloxicam (MOBIC) 15 MG tablet Take 15 mg by mouth every morning. With food or with a full glass of water   Yes Historical Provider, MD  memantine (NAMENDA) 10 MG tablet Take 10 mg by mouth 2 (two) times daily.   Yes Historical Provider, MD  naproxen sodium (ANAPROX) 220 MG tablet Take 220  mg by mouth 2 (two) times daily as needed (headaches).   Yes Historical Provider, MD  nystatin cream (MYCOSTATIN) Apply 1 application topically 2 (two) times daily as needed for dry skin.   Yes Historical Provider, MD  promethazine (PHENERGAN) 12.5 MG tablet Take 12.5 mg by mouth every 12 (twelve) hours as needed for nausea or vomiting.   Yes Historical Provider, MD  ranitidine (ZANTAC) 150 MG tablet Take 150 mg by mouth 2 (two) times daily.   Yes Historical  Provider, MD  sertraline (ZOLOFT) 50 MG tablet Take 50 mg by mouth at bedtime.    Yes Historical Provider, MD  tiZANidine (ZANAFLEX) 4 MG capsule Take 4 mg by mouth 3 (three) times daily as needed for muscle spasms.   Yes Historical Provider, MD  Vitamin D, Cholecalciferol, 1000 UNITS TABS Take 1 capsule by mouth daily.   Yes Historical Provider, MD   No Known Allergies  FAMILY HISTORY:  indicated that his mother is deceased. He indicated that his father is deceased. He indicated that his brother is alive.  SOCIAL HISTORY:  reports that he quit smoking about 18 years ago. His smoking use included Cigarettes. He has a 117 pack-year smoking history. He has never used smokeless tobacco. He reports that he does not drink alcohol or use illicit drugs.  REVIEW OF SYSTEMS:  Unable   SUBJECTIVE: critically ill  VITAL SIGNS: Temp:  [96.7 F (35.9 C)-97.8 F (36.6 C)] 96.9 F (36.1 C) (08/11 1400) Pulse Rate:  [30-69] 31 (08/11 1400) Resp:  [22-36] 29 (08/11 1400) BP: (81-113)/(39-62) 98/43 mmHg (08/11 1400) SpO2:  [83 %-97 %] 94 % (08/11 1400) FiO2 (%):  [50 %-100 %] 50 % (08/11 1444) Weight:  [98.884 kg (218 lb)] 98.884 kg (218 lb) (08/11 1200) HEMODYNAMICS:   VENTILATOR SETTINGS: Vent Mode:  [-] PRVC FiO2 (%):  [50 %-100 %] 50 % Set Rate:  [20 bmp-35 bmp] 35 bmp Vt Set:  [550 mL] 550 mL PEEP:  [5 cmH20-8 cmH20] 8 cmH20 INTAKE / OUTPUT:  Intake/Output Summary (Last 24 hours) at 01/31/2015 1452 Last data filed at 01/15/2015 1417  Gross per 24 hour  Intake   3250 ml  Output   1300 ml  Net   1950 ml    PHYSICAL EXAMINATION: General:  Acutely ill appearing white male, currently in refractory shock s/p cardiac arrest Neuro:  GCS 3 does not respond to noxious stimuli  HEENT:  Orally intubated  Cardiovascular:  Huston Foley rrr paced on tele  Lungs:  Coarse scattered rhonchi, marked accessory muscle use  Abdomen:  Distended, tympanic hypoactive  Musculoskeletal:  Intact  Skin:  Cool and  mottled   LABS:  CBC  Recent Labs Lab 01/27/2015 0920 30-Jan-2015 1215  WBC 20.0* 14.8*  HGB 12.7* 11.3*  HCT 38.0* 35.9*  PLT 210 217   Coag's No results for input(s): APTT, INR in the last 168 hours. BMET  Recent Labs Lab Jan 30, 2015 0920 30-Jan-2015 1215  NA 141 141  K 5.1 5.6*  CL 105 106  CO2 22 20*  BUN 70* 71*  CREATININE 5.53* 5.38*  GLUCOSE 131* 243*   Electrolytes  Recent Labs Lab 02/04/2015 0920 02/07/2015 1215  CALCIUM 9.2 8.9   Sepsis Markers  Recent Labs Lab 01/29/2015 0947 30-Jan-2015 1111 02/01/2015 1224  LATICACIDVEN 3.08* 1.80 6.20*   ABG  Recent Labs Lab 01/17/2015 1215  PHART 7.101*  PCO2ART 58.3*  PO2ART 199*   Liver Enzymes  Recent Labs Lab January 30, 2015 0920  AST 28  ALT 19  ALKPHOS 88  BILITOT 1.3*  ALBUMIN 3.7   Cardiac Enzymes  Recent Labs Lab 01/31/2015 0920  TROPONINI 0.03   Glucose No results for input(s): GLUCAP in the last 168 hours.  Imaging Dg Chest Port 1 View  01/20/2015   CLINICAL DATA:  Right IJ line placement  EXAM: PORTABLE CHEST - 1 VIEW  COMPARISON:  Portable chest x-ray of 01/18/2015  FINDINGS: Unfortunately, the defibrillator pad overlies the lower right heart shadow, and the tip of the right IJ central venous line may not be completely visualized. The right IJ central venous line tip is seen to a least the SVC-RA junction. No pneumothorax is seen. There is patchy airspace disease suspicious for pneumonia. Cardiomegaly is stable. Permanent pacemaker remains. The tip of the endotracheal tube is approximately 3.4 cm above the carina.  IMPRESSION: 1. The tip of the right IJ central venous line may be partially obscured by the defibrillator pad, but it appears to extend to at least the SVC -RA junction. No pneumothorax. 2. Patchy airspace disease suspicious for pneumonia.   Electronically Signed   By: Dwyane Dee M.D.   On: 02/06/2015 13:54   Dg Chest Portable 1 View  02/05/2015   CLINICAL DATA:  Endotracheal and nasogastric  tube placements.  EXAM: PORTABLE CHEST - 1 VIEW  COMPARISON:  02/05/2015 at 1010 hours  FINDINGS: Endotracheal tube has been placed and terminates approximately 3 cm above the carina. Enteric tube courses into the left upper abdomen with tip not imaged. Cardiac silhouette remains mildly enlarged with a dual lead pacemaker again noted. There is increased pulmonary vascular congestion with mild, patchy opacities in the right greater than left mid and lower lungs. No segmental airspace consolidation, pleural effusion, or pneumothorax is identified.  IMPRESSION: 1. Endotracheal and enteric tubes as above. 2. Increased pulmonary vascular congestion with likely mild edema.   Electronically Signed   By: Sebastian Ache   On: 02/05/2015 12:49   Dg Chest Portable 1 View  01/22/2015   CLINICAL DATA:  Nausea vomiting and diarrhea for 2 days.  EXAM: PORTABLE CHEST - 1 VIEW  COMPARISON:  10/23/2014  FINDINGS: Left chest wall pacer device is noted with lead in the right atrial appendage and right ventricle. The heart size appears normal. There is no pleural effusion or edema. No airspace consolidation identified. Lung volumes appear low.  IMPRESSION: 1. Low lung volumes.   Electronically Signed   By: Signa Kell M.D.   On: 02/07/2015 10:39   Dg Abd Portable 2v  02/10/2015   CLINICAL DATA:  Nausea, vomiting.  EXAM: PORTABLE ABDOMEN - 2 VIEW  COMPARISON:  August 10, 2014.  FINDINGS: No definite pneumoperitoneum is noted. There is interval development of severe small bowel dilatation concerning for distal small bowel obstruction. Phlebolith is noted in the pelvis.  IMPRESSION: Interval development of severe small bowel dilatation consistent with distal small bowel obstruction.   Electronically Signed   By: Lupita Raider, M.D.   On: 01/31/2015 10:39     ASSESSMENT / PLAN:  PULMONARY OETT 8/11>>> A: Acute hypoxic and hypercarbic respiratory arrest (P/F ratio <200) Hypoxic respiratory failure in setting of  aspiration PNA and evolving ARDS P:   ards protocol NMB protocol given P/F ratio <200 and difficulty oxygenating  fent and versed gtt Sputum culture   CARDIOVASCULAR CVL right IJ CVL 8/11>>>> A:  Refractory Septic shock/MODS S/p cardiopulmonary PEA arrest (17 minutes to ROSC in spite of provider ACLS) H/o symptomatic bradycardia  w/ PPM  > has completed 30 ml/kg; still in shock > I am concerned that there may be ischemic bowel contributing to his level of shock. Also suspect that acidosis is contributing  P:  Volume resuscitation completed; will keep CVP goal 8-12 MAP goal > 65 On levophed, and vasopressin and now adding epineprhine gtt  Ck cortisol; consider stress dose steroids   RENAL A:   Acute renal failure + anion gap acidosis (lactic acidosis)-->s/p arrest and persistent shock Hyperkalemia  P:   Repeat blood chemistry, then trend serial chemistries  Bicarb gtt X 1 liter given profound acidosis  Strict I&O Renal dose meds May need CRRT if K rising and no improvement-->although plan on talking w/ family before we would consider this   GASTROINTESTINAL A:   N/V and abd pain SBO P:   NPO Bowel rest NGT to LIWS PPI   HEMATOLOGIC A:   Anemia of chronic disease  Leukocytosis  P:  [place] heparin AND SCDs as he is on NMB Trend CBC Transfuse for Hgb < 7   INFECTIOUS A:   Septic shock likely bacterial translocation from gut as well as UT source  Aspiration PNA  P:   BCx2 8/11>>> UC 8/11>>> Sputum: resp culture 8/11>>> Abx: vanc 8/11>>> Zosyn 8/11>>>  ENDOCRINE A:   Hyperglycemia   P:   ssi protocol   NEUROLOGIC A:   Acute encephalopathy in setting of sepsis, acidosis and s/p cardiac arrest: Concerned about possible hypoxic injury  P:   RASS goal: -5 NMB protocol w/ BIS monitoring  Fentanyl and versed gtt   FAMILY  - Updates: pending   - Inter-disciplinary family meet or Palliative Care meeting due by: 8/16     TODAY'S SUMMARY:  refractory  shock MODS. Working dx: prob UTI,  SBO w/ bacterial translocation vs ischemic bowel. We are adding epinephrine to vasopressin and levophed. He is profoundly acidotic and hyperkalemic. Family is on way in. He will need HD but do not think he can survive this and currently he is not a surgical candidate. Will recommend DNR w/ no escalation. I do not think he can survive this.   Simonne Martinet ACNP-BC [facility] Pulmonary/Critical Care Pager # [phone] OR # [phone] if no answer     22-Feb-2015, 2:52 PM

## 2015-01-23 NOTE — Progress Notes (Signed)
RT assisted with transporting PT from [facility] ED Resp B to [facility] ICU 1226. Transported on 100% and was uneventful.  Vent settings in EPIC- RN is at bedside.

## 2015-01-23 NOTE — ED Notes (Signed)
UPPER AND LOWER DENTURES, WATCH AND CLOTHING WENT TO ICU

## 2015-01-23 NOTE — ED Notes (Signed)
CRITICAL CARE NP PETE at bedside. PRESENT TO EVALUATE PT. PLACING CENTRAL LINE. NESTOR MD ALSO PRESENT TO EVALUATE PRESENT. UPDATED BY PETE NP.

## 2015-01-23 NOTE — ED Notes (Signed)
Bed: WA01 Expected date: 02/08/15 Expected time:  Means of arrival:  Comments: EMS HYPOTENSIVE

## 2015-01-23 NOTE — ED Notes (Signed)
POC Lactic 6.20 Rn Ashely L. DR Verdie Mosher, NP Cindee Lame notified

## 2015-01-23 NOTE — ED Notes (Signed)
??? ?? ????? ?? ?? ? ?? ??  ??? ???   6???? ?? ????? ??? ?????. 6??? ???? ??? ?? ? ?? ?? ??? ??????

## 2015-01-23 NOTE — ED Notes (Signed)
MD at bedside.with ultrasound. Rn to start IV and draw labs

## 2015-01-23 NOTE — ED Provider Notes (Signed)
CSN: [id number]     Arrival date & time 02/02/2015  6213 History   First MD Initiated Contact with Patient 02/01/2015 [phone]     Chief Complaint  Patient presents with  . Hypotension  . Emesis  . Diarrhea     (Consider location/radiation/quality/duration/timing/severity/associated sxs/prior Treatment) HPI 74 year old male who presents with nausea, vomiting, diarrhea and hypotension. He has a history of dementia, hyperlipidemia, hypertension, and history of pacemaker implantation. He presents from [facility], with reported 2 days of nausea, vomiting, and diarrhea. The patient yesterday was given Phenergan, but appeared to be sick today so was brought in by EMS at the nursing home was noted to have systolic blood pressures in the 80s as well as an requiring a new oxygen requirement. Although they note that patient was alert and oriented 3 when he left the facility, patient is alert and oriented 1 here. He denies any chest pain, difficulty breathing, but does report abdominal distention, and low abdominal pain.  Received 1 L of IVF prior to arrival by EMS   Past Medical History  Diagnosis Date  . Near syncope   . Sinus bradycardia   . Diastolic murmur   . Hypertension   . Renal insufficiency   . History of tobacco abuse   . Syncope   . Dementia   . Seborrheic dermatitis   . Hyperlipidemia    Past Surgical History  Procedure Laterality Date  . Pacemaker insertion  03/19/13    SJM pacemaker implanted for symptomatic sinus bradycardia by Dr Johney Frame  . Kidney stone surgery  06/2013  . Multiple tooth extractions      Full Dentures  . Permanent pacemaker insertion N/A 03/19/2013    Procedure: PERMANENT PACEMAKER INSERTION;  Surgeon: Gardiner Rhyme, MD;  Location: [facility] CATH LAB;  Service: Cardiovascular;  Laterality: N/A;  . Left heart catheterization with coronary angiogram N/A 09/10/2014    Procedure: LEFT HEART CATHETERIZATION WITH CORONARY ANGIOGRAM;  Surgeon: Lyn Records,  MD;  Location: [facility] CATH LAB;  Service: Cardiovascular;  Laterality: N/A;  . Cardiac catheterization     Family History  Problem Relation Age of Onset  . Heart disease Father     Deceased-90s  . Cancer Father   . Stroke Mother 100    Deceased  . Heart disease Mother   . Dementia Mother   . Healthy Brother     x1  . Hypertension Daughter     x1   Social History  Substance Use Topics  . Smoking status: Former Smoker -- 3.00 packs/day for 39 years    Types: Cigarettes    Quit date: 06/14/1996  . Smokeless tobacco: Never Used     Comment: use to smoke 3 packs per day for 6 years quit in 1993  . Alcohol Use: No    Review of Systems  Unable to perform ROS: Dementia      Allergies  Review of patient's allergies indicates no known allergies.  Home Medications   Prior to Admission medications   Medication Sig Start Date End Date Taking? Authorizing Provider  albuterol (PROVENTIL HFA;VENTOLIN HFA) 108 (90 BASE) MCG/ACT inhaler Inhale 2 puffs into the lungs every 6 (six) hours as needed for wheezing or shortness of breath.   Yes Historical Provider, MD  aspirin 81 MG chewable tablet Chew 1 tablet (81 mg total) by mouth daily. 09/11/14  Yes Rhetta Mura, MD  atorvastatin (LIPITOR) 10 MG tablet TAKE 1 TABLET BY MOUTH ONCE DAILY. 11/26/13  Yes Chrissie Noa  Enid Skeens, PA-C  carvedilol (COREG) 3.125 MG tablet Take 1 tablet (3.125 mg total) by mouth 2 (two) times daily with a meal. 09/11/14  Yes Rhetta Mura, MD  cetirizine (ZYRTEC) 10 MG tablet Take 10 mg by mouth daily.   Yes Historical Provider, MD  donepezil (ARICEPT) 10 MG tablet Take 1 tablet (10 mg total) by mouth daily. 07/09/14  Yes Van Clines, MD  gabapentin (NEURONTIN) 100 MG capsule Take 100 mg by mouth 2 (two) times daily.   Yes Historical Provider, MD  guaiFENesin (MUCINEX) 600 MG 12 hr tablet Take 600 mg by mouth 2 (two) times daily as needed for cough or to loosen phlegm.   Yes Historical Provider, MD   hydrocortisone cream 1 % Apply topically 2 (two) times daily. Patient taking differently: Apply topically as needed (for rash).  07/27/14  Yes Adrian Blackwater Moding, MD  levothyroxine (SYNTHROID, LEVOTHROID) 125 MCG tablet Take 125 mcg by mouth daily before breakfast.   Yes Historical Provider, MD  losartan (COZAAR) 50 MG tablet TAKE 1 TABLET BY MOUTH ONCE A DAY. 11/17/14  Yes Waldon Merl, PA-C  meclizine (ANTIVERT) 25 MG tablet Take 25 mg by mouth 3 (three) times daily as needed for dizziness.   Yes Historical Provider, MD  meloxicam (MOBIC) 15 MG tablet Take 15 mg by mouth every morning. With food or with a full glass of water   Yes Historical Provider, MD  memantine (NAMENDA) 10 MG tablet Take 10 mg by mouth 2 (two) times daily.   Yes Historical Provider, MD  naproxen sodium (ANAPROX) 220 MG tablet Take 220 mg by mouth 2 (two) times daily as needed (headaches).   Yes Historical Provider, MD  nystatin cream (MYCOSTATIN) Apply 1 application topically 2 (two) times daily as needed for dry skin.   Yes Historical Provider, MD  promethazine (PHENERGAN) 12.5 MG tablet Take 12.5 mg by mouth every 12 (twelve) hours as needed for nausea or vomiting.   Yes Historical Provider, MD  ranitidine (ZANTAC) 150 MG tablet Take 150 mg by mouth 2 (two) times daily.   Yes Historical Provider, MD  sertraline (ZOLOFT) 50 MG tablet Take 50 mg by mouth at bedtime.    Yes Historical Provider, MD  tiZANidine (ZANAFLEX) 4 MG capsule Take 4 mg by mouth 3 (three) times daily as needed for muscle spasms.   Yes Historical Provider, MD  Vitamin D, Cholecalciferol, 1000 UNITS TABS Take 1 capsule by mouth daily.   Yes Historical Provider, MD   BP 98/43 mmHg  Pulse 31  Temp(Src) 96.9 F (36.1 C) (Oral)  Resp 29  Wt 218 lb (98.884 kg)  SpO2 94% Physical Exam Physical Exam  Nursing note and vitals reviewed. Constitutional: Elderly man, non-toxic in appearance, in mild respiratory distress Head: Normocephalic and atraumatic.   Mouth/Throat: Oropharynx is clear. Dry mucous membranes.  Neck: Normal range of motion. Neck supple.  Cardiovascular: Normal rate and regular rhythm.  No edema. +2 symmetric radial pulses bilaterally Pulmonary/Chest: increased effort with tachypnea, coarse rhonchorous breath sounds in all lung fields  Abdominal: Soft. Significantly distended. No major tenderness to palpation. There is no rebound and no guarding.  Musculoskeletal: No deformities Neurological: Alert, no facial droop,moves all extremities symmetrically Skin: Skin is warm and dry.  Psychiatric: Cooperative  ED Course  INTUBATION Date/Time: 02/05/2015 3:00 PM Performed by: Crista Curb DUO Authorized by: Crista Curb DUO Consent: The procedure was performed in an emergent situation. Indications: respiratory failure Intubation method: video-assisted Patient status: unconscious Tube  size: 8.0 mm Tube type: cuffed Number of attempts: 1 Cricoid pressure: no Cords visualized: yes Post-procedure assessment: chest rise,  ETCO2 monitor and CO2 detector Breath sounds: equal Cuff inflated: yes ETT to lip: 22 cm Tube secured with: ETT holder Chest x-ray findings: endotracheal tube in appropriate position   (including critical care time) Labs Review Labs Reviewed  CBC WITH DIFFERENTIAL/PLATELET - Abnormal; Notable for the following:    WBC 20.0 (*)    RBC 4.18 (*)    Hemoglobin 12.7 (*)    HCT 38.0 (*)    Neutrophils Relative % 82 (*)    Neutro Abs 16.4 (*)    Lymphocytes Relative 5 (*)    Monocytes Relative 13 (*)    Monocytes Absolute 2.6 (*)    All other components within normal limits  COMPREHENSIVE METABOLIC PANEL - Abnormal; Notable for the following:    Glucose, Bld 131 (*)    BUN 70 (*)    Creatinine, Ser 5.53 (*)    Total Bilirubin 1.3 (*)    GFR calc non Af Amer 9 (*)    GFR calc Af Amer 11 (*)    All other components within normal limits  URINALYSIS, ROUTINE W REFLEX MICROSCOPIC (NOT AT [facility]) - Abnormal;  Notable for the following:    Color, Urine AMBER (*)    APPearance TURBID (*)    Hgb urine dipstick MODERATE (*)    Protein, ur 30 (*)    Leukocytes, UA LARGE (*)    All other components within normal limits  BLOOD GAS, ARTERIAL - Abnormal; Notable for the following:    pH, Arterial 7.101 (*)    pCO2 arterial 58.3 (*)    pO2, Arterial 199 (*)    Bicarbonate 17.7 (*)    Acid-base deficit 12.2 (*)    All other components within normal limits  CBC WITH DIFFERENTIAL/PLATELET - Abnormal; Notable for the following:    WBC 14.8 (*)    RBC 3.80 (*)    Hemoglobin 11.3 (*)    HCT 35.9 (*)    Neutrophils Relative % 81 (*)    Neutro Abs 12.0 (*)    All other components within normal limits  BASIC METABOLIC PANEL - Abnormal; Notable for the following:    Potassium 5.6 (*)    CO2 20 (*)    Glucose, Bld 243 (*)    BUN 71 (*)    Creatinine, Ser 5.38 (*)    GFR calc non Af Amer 10 (*)    GFR calc Af Amer 11 (*)    All other components within normal limits  URINE MICROSCOPIC-ADD ON - Abnormal; Notable for the following:    Squamous Epithelial / LPF FEW (*)    Bacteria, UA MANY (*)    All other components within normal limits  I-STAT CG4 LACTIC ACID, ED - Abnormal; Notable for the following:    Lactic Acid, Venous 3.08 (*)    All other components within normal limits  I-STAT CG4 LACTIC ACID, ED - Abnormal; Notable for the following:    Lactic Acid, Venous 6.20 (*)    All other components within normal limits  CULTURE, BLOOD (ROUTINE X 2)  CULTURE, BLOOD (ROUTINE X 2)  LIPASE, BLOOD  TROPONIN I  BRAIN NATRIURETIC PEPTIDE  LACTIC ACID, PLASMA  LACTIC ACID, PLASMA  CORTISOL  PROTIME-INR  APTT  FIBRINOGEN  PROCALCITONIN  RENAL FUNCTION PANEL  RENAL FUNCTION PANEL  I-STAT CG4 LACTIC ACID, ED  I-STAT TROPOININ, ED  I-STAT CG4 LACTIC  ACID, ED  I-STAT CG4 LACTIC ACID, ED  I-STAT CG4 LACTIC ACID, ED  TYPE AND SCREEN    Imaging Review Dg Chest Port 1 View  02-18-15   CLINICAL  DATA:  Right IJ line placement  EXAM: PORTABLE CHEST - 1 VIEW  COMPARISON:  Portable chest x-ray of 02-18-15  FINDINGS: Unfortunately, the defibrillator pad overlies the lower right heart shadow, and the tip of the right IJ central venous line may not be completely visualized. The right IJ central venous line tip is seen to a least the SVC-RA junction. No pneumothorax is seen. There is patchy airspace disease suspicious for pneumonia. Cardiomegaly is stable. Permanent pacemaker remains. The tip of the endotracheal tube is approximately 3.4 cm above the carina.  IMPRESSION: 1. The tip of the right IJ central venous line may be partially obscured by the defibrillator pad, but it appears to extend to at least the SVC -RA junction. No pneumothorax. 2. Patchy airspace disease suspicious for pneumonia.   Electronically Signed   By: Dwyane Dee M.D.   On: 02/18/15 13:54   Dg Chest Portable 1 View  02-18-2015   CLINICAL DATA:  Endotracheal and nasogastric tube placements.  EXAM: PORTABLE CHEST - 1 VIEW  COMPARISON:  02-18-2015 at 1010 hours  FINDINGS: Endotracheal tube has been placed and terminates approximately 3 cm above the carina. Enteric tube courses into the left upper abdomen with tip not imaged. Cardiac silhouette remains mildly enlarged with a dual lead pacemaker again noted. There is increased pulmonary vascular congestion with mild, patchy opacities in the right greater than left mid and lower lungs. No segmental airspace consolidation, pleural effusion, or pneumothorax is identified.  IMPRESSION: 1. Endotracheal and enteric tubes as above. 2. Increased pulmonary vascular congestion with likely mild edema.   Electronically Signed   By: Sebastian Ache   On: Feb 18, 2015 12:49   Dg Chest Portable 1 View  02/18/2015   CLINICAL DATA:  Nausea vomiting and diarrhea for 2 days.  EXAM: PORTABLE CHEST - 1 VIEW  COMPARISON:  10/23/2014  FINDINGS: Left chest wall pacer device is noted with lead in the right atrial  appendage and right ventricle. The heart size appears normal. There is no pleural effusion or edema. No airspace consolidation identified. Lung volumes appear low.  IMPRESSION: 1. Low lung volumes.   Electronically Signed   By: Signa Kell M.D.   On: Feb 18, 2015 10:39   Dg Abd Portable 2v  02/18/2015   CLINICAL DATA:  Nausea, vomiting.  EXAM: PORTABLE ABDOMEN - 2 VIEW  COMPARISON:  August 10, 2014.  FINDINGS: No definite pneumoperitoneum is noted. There is interval development of severe small bowel dilatation concerning for distal small bowel obstruction. Phlebolith is noted in the pelvis.  IMPRESSION: Interval development of severe small bowel dilatation consistent with distal small bowel obstruction.   Electronically Signed   By: Lupita Raider, M.D.   On: 02/18/15 10:39     EKG Interpretation None      MDM   Final diagnoses:  Acute kidney injury  Acute respiratory failure with hypoxia  Aspiration into airway, initial encounter  Small bowel obstruction  Lactic acidosis  Cardiac arrest   In short, this is a 74 year old male with history of pacemaker placement, hypertension, hyperlipidemia who presents with 2 days of nausea, vomiting, diarrhea and hypotension. He on arrival is an elderly appearing man with some mild respiratory distress, but is nontoxic in appearance. He has a 4 L oxygen requirement, and I suspect  that he likely aspirated, as he has coarse rhonchorous breath sounds on exam. He is also hypotensive with systolic blood pressure in the 80s, but not tachycardic. He does have a pacemaker, and is unlikely to have a tachycardic response. He appears dry on exam, there is also concern for intra-abdominal process as he has a distended abdomen. No peritoneal signs are noted on exam.  IV access established and he received 2 L of IV fluids, with improvement in his blood pressure 100s systolic. Bedside x-rays visualized and reveals dilated loops of small bowel, concerning for bowel  obstruction. A chest x-ray shows no evidence of infiltrate or significant pulmonary edema. He does have initial elevated lactate of 3.0 and leukocytosis of 20. He is empirically treated with IV vancomycin and Zosyn for possible intra-abdominal sepsis.  The plan was for patient to undergo CT abdomen and pelvis without contrast for evaluation of intra-abdominal pathology. While undergoing CT, patient had episode of large-volume emesis, and was noted to be in more significant respiratory distress. It was felt at this time the patient required intubation. > 1.5 L of feculent emesis obtained via NG tube prior to this.  He was brought back to the resuscitation bay from the CT scanner, patient had loss of pulses. CPR was subsequently started, and patient was intubated. He received 3 rounds of epinephrine, and subsequently had return of spontaneous circulation with total time down 17 minutes. Post resuscitation EKG notable for diffuse ST depressions and with elevated lactate and troponin, likely reflective of global ischemia during respiratory/cardiac arrest.   Discussed with intensivist and admitted to ICU for ongoing management. General surgery also consulted and will continue to follow.     CRITICAL CARE Performed by: Lavera Guise   Total critical care time: 50 minutes  Critical care time was exclusive of separately billable procedures and treating other patients.  Critical care was necessary to treat or prevent imminent or life-threatening deterioration.  Critical care was time spent personally by me on the following activities: development of treatment plan with patient and/or surrogate as well as nursing, discussions with consultants, evaluation of patient's response to treatment, examination of patient, obtaining history from patient or surrogate, ordering and performing treatments and interventions, ordering and review of laboratory studies, ordering and review of radiographic studies, pulse  oximetry and re-evaluation of patient's condition.   Lavera Guise, MD 01/24/2015 [phone]

## 2015-01-23 NOTE — Progress Notes (Signed)
Pt on ARDS protocol with a rate of 35 and a vt of 550 (8cc). Called the [facility] and talked to Dr. Vaughan Basta based on current ABG result. Dr. Vaughan Basta suggested to leave Vt on 550, but adjust PEEP/FI02 per Protocol as BP will tolerate. RT will continue to monitor.

## 2015-01-23 NOTE — ED Notes (Addendum)
Lactic acid results given to Dr. Verdie Mosher at bedside

## 2015-01-23 NOTE — Progress Notes (Signed)
eLink Physician-Brief Progress Note Patient Name: GREGOIRE BENNIS DOB: 1940/10/04 MRN: [id number]   Date of Service  02/11/2015  HPI/Events of Note  Multiple issues: 1. Do we still want A-line placed? And 2. Do we still want NaHCO3 IV infusion. BP = 124/43. No issues with non invasive BP. Initial pH = 7.101 and then 7.221.  eICU Interventions  Will order: 1. D/C A-line order. 2. Continue NaHCO3 IV infusion.      Intervention Category Major Interventions: Hypotension - evaluation and management  Kanetra Ho Eugene 01/17/2015, 8:12 PM

## 2015-01-23 NOTE — Progress Notes (Signed)
RT assisted with intubation (procedure completed by ED MD). PT is now intubated with 7.5 ETT at 24 lip- tube holder in place, positive End Tidal and bilateral breath sounds, CXR pending- RN at bedside.

## 2015-01-23 NOTE — Progress Notes (Signed)
ANTIBIOTIC CONSULT NOTE - INITIAL  Pharmacy Consult for vancomycin and zosyn Indication: sepsis  No Known Allergies  Patient Measurements: Weight: 218 lb (98.884 kg) (Used weight from recent visit in order to expedite order)   Vital Signs: Temp: 96.9 F (36.1 C) (08/11 1400) Temp Source: Oral (08/11 0906) BP: 98/43 mmHg (08/11 1400) Pulse Rate: 31 (08/11 1400) Intake/Output from previous day:   Intake/Output from this shift: Total I/O In: 3250 [I.V.:3250] Out: -   Labs:  Recent Labs  02/08/2015 0920 01/28/2015 1215  WBC 20.0* 14.8*  HGB 12.7* 11.3*  PLT 210 217  CREATININE 5.53* 5.38*   Estimated Creatinine Clearance: 14.1 mL/min (by C-G formula based on Cr of 5.38). No results for input(s): VANCOTROUGH, VANCOPEAK, VANCORANDOM, GENTTROUGH, GENTPEAK, GENTRANDOM, TOBRATROUGH, TOBRAPEAK, TOBRARND, AMIKACINPEAK, AMIKACINTROU, AMIKACIN in the last 72 hours.   Microbiology: Recent Results (from the past 720 hour(s))  Blood culture (routine x 2)     Status: None (Preliminary result)   Collection Time: 02/11/2015 10:09 AM  Result Value Ref Range Status   Specimen Description BLOOD RIGHT ANTECUBITAL  Final   Special Requests BOTTLES DRAWN AEROBIC AND ANAEROBIC 3 ML  Final   Culture PENDING  Incomplete   Report Status PENDING  Incomplete    Medical History: Past Medical History  Diagnosis Date  . Near syncope   . Sinus bradycardia   . Diastolic murmur   . Hypertension   . Renal insufficiency   . History of tobacco abuse   . Syncope   . Dementia   . Seborrheic dermatitis   . Hyperlipidemia     Assessment:  74 y.o. male presented to the ED with nausea, vomiting and diarrhea for 2 days, he got phenergan last PM, no further vomiting, but ongoing diarrhea, with dark brown emesis, distended abdomen, and hypotensive. EMS gave him a bolus of fluid enroute. Admit BP 81/46 HR 64. Labs shows creatinine up to 5.53, lactate was initially 3.08, but later up to 6.2. WBC 20K, UA  shows probable UTI. 2 view abdomen showed interval SBO, CXR shows low lung volumes. During work up he has had a respiratory arrest, possible from aspiration, and is now intubated. He was down for 17 minutes. He is intubated, hypotensive.  Pharmacy consulted to dose vancomycin and zosyn for sepsis.   Scr 5.38, CrCl ~ 78mls/min (12N)  Goal of Therapy:  Vancomycin trough level 15-20 mcg/ml vanc and zosyn per renal function  Plan:   Vancomycin 1gm IV q48h  Zosyn 2.25mg  IV q8h  Follow renal function, cultures, clinical course  Arley Phenix RPh 01/22/2015, 2:13 PM Pager [phone]   Iona Hansen E 01/16/2015,2:08 PM

## 2015-01-23 NOTE — ED Notes (Signed)
RN aware of BP - Set to cycle every 15 mins.

## 2015-01-23 NOTE — Progress Notes (Signed)
Pharmacy - Brief Note    Pharmacy asked to quad-strength norepinephrine ( /221ml).  Spoke with Abla, RN and informed her to adjust concentration in Alaris pump for this change.  Juliette Alcide, PharmD, BCPS.   Pager: [phone] 01/16/2015 5:30 PM

## 2015-01-23 NOTE — Progress Notes (Signed)
Long discussion w/ daughter Misty Stanley. He is in refractory MODS. Made Mr Wiebelhaus DNR w/ no escalation.   Simonne Martinet ACNP-BC [facility] Pulmonary/Critical Care Pager # [phone] OR # [phone] if no answer

## 2015-01-23 NOTE — Progress Notes (Signed)
74 yr old male from [facility] with admitting dx cardiac arrest

## 2015-01-23 NOTE — ED Notes (Signed)
63

## 2015-01-24 ENCOUNTER — Inpatient Hospital Stay (HOSPITAL_COMMUNITY): Payer: Medicare Other

## 2015-01-24 DIAGNOSIS — R579 Shock, unspecified: Secondary | ICD-10-CM | POA: Insufficient documentation

## 2015-01-24 DIAGNOSIS — A419 Sepsis, unspecified organism: Principal | ICD-10-CM

## 2015-01-24 DIAGNOSIS — R6521 Severe sepsis with septic shock: Secondary | ICD-10-CM

## 2015-01-24 DIAGNOSIS — E872 Acidosis, unspecified: Secondary | ICD-10-CM | POA: Insufficient documentation

## 2015-01-24 DIAGNOSIS — I469 Cardiac arrest, cause unspecified: Secondary | ICD-10-CM

## 2015-01-24 LAB — CBC WITH DIFFERENTIAL/PLATELET
Basophils Absolute: 0 10*3/uL (ref 0.0–0.1)
Basophils Relative: 0 % (ref 0–1)
EOS ABS: 0.3 10*3/uL (ref 0.0–0.7)
EOS PCT: 3 % (ref 0–5)
HCT: 28.8 % — ABNORMAL LOW (ref 39.0–52.0)
Hemoglobin: 10.1 g/dL — ABNORMAL LOW (ref 13.0–17.0)
LYMPHS PCT: 12 % (ref 12–46)
Lymphs Abs: 1 10*3/uL (ref 0.7–4.0)
MCH: 30.4 pg (ref 26.0–34.0)
MCHC: 35.1 g/dL (ref 30.0–36.0)
MCV: 86.7 fL (ref 78.0–100.0)
MONOS PCT: 4 % (ref 3–12)
Monocytes Absolute: 0.3 10*3/uL (ref 0.1–1.0)
Neutro Abs: 7 10*3/uL (ref 1.7–7.7)
Neutrophils Relative %: 81 % — ABNORMAL HIGH (ref 43–77)
PLATELETS: 139 10*3/uL — AB (ref 150–400)
RBC: 3.32 MIL/uL — AB (ref 4.22–5.81)
RDW: 13.8 % (ref 11.5–15.5)
WBC: 8.6 10*3/uL (ref 4.0–10.5)

## 2015-01-24 LAB — RENAL FUNCTION PANEL
ALBUMIN: 2 g/dL — AB (ref 3.5–5.0)
ANION GAP: 15 (ref 5–15)
Albumin: 1.9 g/dL — ABNORMAL LOW (ref 3.5–5.0)
Albumin: 1.9 g/dL — ABNORMAL LOW (ref 3.5–5.0)
Albumin: 1.9 g/dL — ABNORMAL LOW (ref 3.5–5.0)
Anion gap: 13 (ref 5–15)
Anion gap: 12 (ref 5–15)
Anion gap: 15 (ref 5–15)
BUN: 70 mg/dL — AB (ref 6–20)
BUN: 70 mg/dL — ABNORMAL HIGH (ref 6–20)
BUN: 72 mg/dL — ABNORMAL HIGH (ref 6–20)
BUN: 74 mg/dL — AB (ref 6–20)
CHLORIDE: 102 mmol/L (ref 101–111)
CO2: 22 mmol/L (ref 22–32)
CO2: 22 mmol/L (ref 22–32)
CO2: 20 mmol/L — AB (ref 22–32)
CO2: 19 mmol/L — ABNORMAL LOW (ref 22–32)
CREATININE: 5.37 mg/dL — AB (ref 0.61–1.24)
CREATININE: 5.37 mg/dL — AB (ref 0.61–1.24)
CREATININE: 5.22 mg/dL — AB (ref 0.61–1.24)
Calcium: 7.3 mg/dL — ABNORMAL LOW (ref 8.9–10.3)
Calcium: 7.2 mg/dL — ABNORMAL LOW (ref 8.9–10.3)
Calcium: 7.2 mg/dL — ABNORMAL LOW (ref 8.9–10.3)
Calcium: 7.1 mg/dL — ABNORMAL LOW (ref 8.9–10.3)
Chloride: 100 mmol/L — ABNORMAL LOW (ref 101–111)
Chloride: 100 mmol/L — ABNORMAL LOW (ref 101–111)
Chloride: 100 mmol/L — ABNORMAL LOW (ref 101–111)
Creatinine, Ser: 5.37 mg/dL — ABNORMAL HIGH (ref 0.61–1.24)
GFR calc Af Amer: 11 mL/min — ABNORMAL LOW (ref >60)
GFR calc Af Amer: 11 mL/min — ABNORMAL LOW (ref >60)
GFR calc non Af Amer: 10 mL/min — ABNORMAL LOW (ref >60)
GFR, EST AFRICAN AMERICAN: 11 mL/min — AB (ref >60)
GFR, EST AFRICAN AMERICAN: 11 mL/min — AB (ref >60)
GFR, EST NON AFRICAN AMERICAN: 10 mL/min — AB (ref >60)
GFR, EST NON AFRICAN AMERICAN: 10 mL/min — AB (ref >60)
GFR, EST NON AFRICAN AMERICAN: 10 mL/min — AB (ref >60)
GLUCOSE: 174 mg/dL — AB (ref 65–99)
Glucose, Bld: 315 mg/dL — ABNORMAL HIGH (ref 65–99)
Glucose, Bld: 292 mg/dL — ABNORMAL HIGH (ref 65–99)
Glucose, Bld: 133 mg/dL — ABNORMAL HIGH (ref 65–99)
POTASSIUM: 3.7 mmol/L (ref 3.5–5.1)
Phosphorus: 1.5 mg/dL — ABNORMAL LOW (ref 2.5–4.6)
Phosphorus: 1.6 mg/dL — ABNORMAL LOW (ref 2.5–4.6)
Phosphorus: 3.6 mg/dL (ref 2.5–4.6)
Phosphorus: 4 mg/dL (ref 2.5–4.6)
Potassium: 4.1 mmol/L (ref 3.5–5.1)
Potassium: 4 mmol/L (ref 3.5–5.1)
Potassium: 3.7 mmol/L (ref 3.5–5.1)
SODIUM: 134 mmol/L — AB (ref 135–145)
SODIUM: 135 mmol/L (ref 135–145)
Sodium: 135 mmol/L (ref 135–145)
Sodium: 136 mmol/L (ref 135–145)

## 2015-01-24 LAB — GLUCOSE, CAPILLARY
Glucose-Capillary: 220 mg/dL — ABNORMAL HIGH (ref 65–99)
Glucose-Capillary: 234 mg/dL — ABNORMAL HIGH (ref 65–99)
Glucose-Capillary: 156 mg/dL — ABNORMAL HIGH (ref 65–99)
Glucose-Capillary: 128 mg/dL — ABNORMAL HIGH (ref 65–99)
Glucose-Capillary: 112 mg/dL — ABNORMAL HIGH (ref 65–99)

## 2015-01-24 LAB — LACTIC ACID, PLASMA
LACTIC ACID, VENOUS: 4.7 mmol/L — AB (ref 0.5–2.0)
Lactic Acid, Venous: 4.5 mmol/L (ref 0.5–2.0)
Lactic Acid, Venous: 4.9 mmol/L (ref 0.5–2.0)

## 2015-01-24 LAB — MAGNESIUM: Magnesium: 0.9 mg/dL — CL (ref 1.7–2.4)

## 2015-01-24 LAB — PROCALCITONIN: PROCALCITONIN: 49.11 ng/mL

## 2015-01-24 MED ORDER — SODIUM CHLORIDE 0.9 % IV BOLUS (SEPSIS)
1000.0000 mL | Freq: Once | INTRAVENOUS | Status: AC
  Administered 2015-01-24: 1000 mL via INTRAVENOUS

## 2015-01-24 MED ORDER — PIPERACILLIN-TAZOBACTAM IN DEX 2-0.25 GM/50ML IV SOLN
2.2500 g | Freq: Four times a day (QID) | INTRAVENOUS | Status: DC
  Administered 2015-01-24 – 2015-01-28 (×16): 2.25 g via INTRAVENOUS
  Filled 2015-01-24 (×19): qty 50

## 2015-01-24 MED ORDER — CHLORHEXIDINE GLUCONATE 0.12 % MT SOLN
OROMUCOSAL | Status: AC
  Filled 2015-01-24: qty 15

## 2015-01-24 MED ORDER — INSULIN ASPART 100 UNIT/ML ~~LOC~~ SOLN
0.0000 [IU] | SUBCUTANEOUS | Status: DC
  Administered 2015-01-24: 3 [IU] via SUBCUTANEOUS
  Administered 2015-01-24: 4 [IU] via SUBCUTANEOUS
  Administered 2015-01-24: 11 [IU] via SUBCUTANEOUS

## 2015-01-24 MED ORDER — SODIUM PHOSPHATE 3 MMOLE/ML IV SOLN
30.0000 mmol | Freq: Once | INTRAVENOUS | Status: AC
  Administered 2015-01-24: 30 mmol via INTRAVENOUS
  Filled 2015-01-24: qty 10

## 2015-01-24 MED ORDER — MAGNESIUM SULFATE 2 GM/50ML IV SOLN
2.0000 g | Freq: Once | INTRAVENOUS | Status: AC
  Administered 2015-01-24: 2 g via INTRAVENOUS

## 2015-01-24 MED ORDER — SODIUM CHLORIDE 0.9 % IV SOLN: INTRAVENOUS | Status: DC | PRN

## 2015-01-24 NOTE — Progress Notes (Signed)
Patient ID: Cody Vincent, male   DOB: [birth year], 74 y.o.   MRN: [id number]  General Surgery - [facility]  HD#: 2  Subjective: Patient in ICU, intubated, sedated.  Patient made DNR with no escalation per family.  Objective: Vital signs in last 24 hours: Temp:  [96.5 F (35.8 C)-99.1 F (37.3 C)] 99 F (37.2 C) (08/12 0630) Pulse Rate:  [59-74] 60 (08/12 0630) Resp:  [22-36] 35 (08/12 0630) BP: (47-153)/(11-81) 102/46 mmHg (08/12 0630) SpO2:  [46 %-100 %] 100 % (08/12 0630) FiO2 (%):  [50 %-100 %] 50 % (08/12 0400) Weight:  [98.884 kg (218 lb)-105.6 kg (232 lb 12.9 oz)] 105.6 kg (232 lb 12.9 oz) (08/12 0400) Last BM Date: 01/24/2015  Intake/Output from previous day: 08/11 0701 - 08/12 0700 In: 10649.4 [I.V.:9109.4; NG/GT:80; IV Piggyback:1460] Out: 1470 [Urine:770] Intake/Output this shift:    Physical Exam: HEENT - sclerae clear, mucous membranes moist Neck - soft, right IJ line Cor - RRR @ 60 Abdomen - much softer, no mass; no surgical incisions; NG with small bilious  Lab Results:   Recent Labs  01/28/2015 0920 01/15/2015 1215  WBC 20.0* 14.8*  HGB 12.7* 11.3*  HCT 38.0* 35.9*  PLT 210 217   BMET  Recent Labs  01/29/2015 1552 01/24/15 0554  NA 142 135  K 6.3* 4.1  CL 110 100*  CO2 22 22  GLUCOSE 158* 315*  BUN 69* 70*  CREATININE 5.50* 5.37*  CALCIUM 8.1* 7.3*   PT/INR  Recent Labs  01/26/2015 1453  LABPROT 18.5*  INR 1.53*   Comprehensive Metabolic Panel:    Component Value Date/Time   NA 135 01/24/2015 0554   NA 142 02/04/2015 1552   K 4.1 01/24/2015 0554   K 6.3* 01/13/2015 1552   CL 100* 01/24/2015 0554   CL 110 02/06/2015 1552   CO2 22 01/24/2015 0554   CO2 22 01/15/2015 1552   BUN 70* 01/24/2015 0554   BUN 69* 02/08/2015 1552   CREATININE 5.37* 01/24/2015 0554   CREATININE 5.50* 02/07/2015 1552   CREATININE 1.16 10/11/2013 1546   CREATININE 1.27 03/20/2012 1553   GLUCOSE 315* 01/24/2015 0554   GLUCOSE 158*  02/07/2015 1552   CALCIUM 7.3* 01/24/2015 0554   CALCIUM 8.1* 02/05/2015 1552   AST 28 01/18/2015 0920   AST 23 09/08/2014 1459   ALT 19 02/11/2015 0920   ALT 15 09/08/2014 1459   ALKPHOS 88 02/06/2015 0920   ALKPHOS 100 09/08/2014 1459   BILITOT 1.3* 01/22/2015 0920   BILITOT 0.7 09/08/2014 1459   PROT 6.9 01/29/2015 0920   PROT 5.8* 09/08/2014 1459   ALBUMIN 2.0* 01/24/2015 0554   ALBUMIN 3.5 01/29/2015 1412    Studies/Results: Dg Chest Port 1 View  01/13/2015   CLINICAL DATA:  Right IJ line placement  EXAM: PORTABLE CHEST - 1 VIEW  COMPARISON:  Portable chest x-ray of 01/19/2015  FINDINGS: Unfortunately, the defibrillator pad overlies the lower right heart shadow, and the tip of the right IJ central venous line may not be completely visualized. The right IJ central venous line tip is seen to a least the SVC-RA junction. No pneumothorax is seen. There is patchy airspace disease suspicious for pneumonia. Cardiomegaly is stable. Permanent pacemaker remains. The tip of the endotracheal tube is approximately 3.4 cm above the carina.  IMPRESSION: 1. The tip of the right IJ central venous line may be partially obscured by the defibrillator pad, but it appears to extend to at least the  SVC -RA junction. No pneumothorax. 2. Patchy airspace disease suspicious for pneumonia.   Electronically Signed   By: Dwyane Dee M.D.   On: 01/22/2015 13:54   Dg Chest Portable 1 View  01/29/2015   CLINICAL DATA:  Endotracheal and nasogastric tube placements.  EXAM: PORTABLE CHEST - 1 VIEW  COMPARISON:  01/18/2015 at 1010 hours  FINDINGS: Endotracheal tube has been placed and terminates approximately 3 cm above the carina. Enteric tube courses into the left upper abdomen with tip not imaged. Cardiac silhouette remains mildly enlarged with a dual lead pacemaker again noted. There is increased pulmonary vascular congestion with mild, patchy opacities in the right greater than left mid and lower lungs. No segmental  airspace consolidation, pleural effusion, or pneumothorax is identified.  IMPRESSION: 1. Endotracheal and enteric tubes as above. 2. Increased pulmonary vascular congestion with likely mild edema.   Electronically Signed   By: Sebastian Ache   On: 01/24/2015 12:49   Dg Chest Portable 1 View  01/22/2015   CLINICAL DATA:  Nausea vomiting and diarrhea for 2 days.  EXAM: PORTABLE CHEST - 1 VIEW  COMPARISON:  10/23/2014  FINDINGS: Left chest wall pacer device is noted with lead in the right atrial appendage and right ventricle. The heart size appears normal. There is no pleural effusion or edema. No airspace consolidation identified. Lung volumes appear low.  IMPRESSION: 1. Low lung volumes.   Electronically Signed   By: Signa Kell M.D.   On: 02/12/2015 10:39   Dg Abd Portable 2v  01/14/2015   CLINICAL DATA:  Nausea, vomiting.  EXAM: PORTABLE ABDOMEN - 2 VIEW  COMPARISON:  August 10, 2014.  FINDINGS: No definite pneumoperitoneum is noted. There is interval development of severe small bowel dilatation concerning for distal small bowel obstruction. Phlebolith is noted in the pelvis.  IMPRESSION: Interval development of severe small bowel dilatation consistent with distal small bowel obstruction.   Electronically Signed   By: Lupita Raider, M.D.   On: 02/12/2015 10:39    Anti-infectives: Anti-infectives    Start     Dose/Rate Route Frequency Ordered Stop   02/12/2015 1430  vancomycin (VANCOCIN) IVPB 1000 mg/200 mL premix     1,000 mg 200 mL/hr over 60 Minutes Intravenous Every 48 hours 01/29/2015 1415     02/10/2015 1430  piperacillin-tazobactam (ZOSYN) IVPB 2.25 g     2.25 g 100 mL/hr over 30 Minutes Intravenous 3 times per day 02/02/2015 1415     02/12/2015 1400  piperacillin-tazobactam (ZOSYN) IVPB 3.375 g  Status:  Discontinued     3.375 g 100 mL/hr over 30 Minutes Intravenous  Once 02/04/2015 1349 01/13/2015 1415   01/14/2015 1400  vancomycin (VANCOCIN) IVPB 1000 mg/200 mL premix  Status:  Discontinued      1,000 mg 200 mL/hr over 60 Minutes Intravenous  Once 01/22/2015 1349 01/26/2015 1415   01/24/2015 1015  vancomycin (VANCOCIN) IVPB 1000 mg/200 mL premix     1,000 mg 200 mL/hr over 60 Minutes Intravenous  Once 01/25/2015 1003 02/12/2015 1236   01/24/2015 1015  piperacillin-tazobactam (ZOSYN) IVPB 4.5 g  Status:  Discontinued     4.5 g 200 mL/hr over 30 Minutes Intravenous  Once 01/26/2015 1003 01/27/2015 1005   01/24/2015 1015  piperacillin-tazobactam (ZOSYN) IVPB 3.375 g     3.375 g 100 mL/hr over 30 Minutes Intravenous STAT 02/12/2015 1006 02/08/2015 1236      Assessment & Plans: 1. Septic shock  IV Vanco, Zosyn empirically  ?source - consider  intra-abdominal source - CT scan may be helpful if patient stabilizes enough to undergo scan  Continue NPO, NG 2.  Respiratory failure / aspiration event 3.  Cardiac arrest / resuscitation  ? Neurologic status - potential for recovery  Will follow.  No role for surgical intervention at this point.  Prognosis poor.  Velora Heckler, MD, [facility] Office: [phone]   Heith Haigler Judie Petit 01/24/2015

## 2015-01-24 NOTE — Progress Notes (Signed)
CRITICAL VALUE ALERT  Critical value received:  Magnesium 0.9  Date of notification:  01/24/15  Time of notification:  0940  Critical value read back:Yes.    Nurse who received alert:  D. Dayna Barker, RN  MD notified (1st page):  Spoke to P. Tanja Port (on unit)  Time of first page:  0940  MD notified (2nd page):  Time of second page:  Responding MD:  P. Babcock/ J. Nestor  Time MD responded:  Immediate  Orders entered.

## 2015-01-24 NOTE — Progress Notes (Signed)
eLink Physician-Brief Progress Note Patient Name: JOVANNE RIGGENBACH DOB: [birth year] MRN: [id number]   Date of Service  01/24/2015  HPI/Events of Note  Patient is not clearing his Lactic Acid: 4.5 >> 4.7 >> 4.9. BP = 120/43. CVP = 5.   eICU Interventions  Will bolus with 0.9 NaCl 1 liter IV over 1 hour now. Recheck CVP post bolus. Would line to keep CVP >= 10. Would check COOX when CVP >= 10.      Intervention Category Major Interventions: Acid-Base disturbance - evaluation and management;Shock - evaluation and management;Sepsis - evaluation and management  Lenell Antu 01/24/2015, 10:57 PM

## 2015-01-24 NOTE — Progress Notes (Signed)
Inpatient Diabetes Program Recommendations  AACE/ADA: New Consensus Statement on Inpatient Glycemic Control (2013)  Target Ranges:  Prepandial:   less than 140 mg/dL      Peak postprandial:   less than 180 mg/dL (1-2 hours)      Critically ill patients:  140 - 180 mg/dL   Results for Cody Vincent, Cody Vincent (MRN [id number]) as of 01/24/2015 11:07  Ref. Range 01/17/2015 23:21 01/24/2015 03:22 01/24/2015 08:23  Glucose-Capillary Latest Ref Range: 65-99 mg/dL 295 (H) 188 (H) 416 (H)    Admit w/ SBO/ Hypotension.   Suffered Cardiopulmonary Arrest on the CT scanner and then developed Septic Shock. No Hx of DM.   Current Orders: RESIST SSI Q4hrs (increased this AM).   Intubated/Sedated/ Vasopressors.      MD- If aggressive care desired by family, please consider starting ICU Glycemic Control Protocol Phase 2 (IV Insulin).    Will follow Ambrose Finland RN, MSN, CDE Diabetes Coordinator Inpatient Glycemic Control Team Team Pager: [phone] (8a-5p)

## 2015-01-24 NOTE — Progress Notes (Signed)
Echocardiogram 2D Echocardiogram has been performed.  Cody Vincent 01/24/2015, 11:50 AM

## 2015-01-24 NOTE — Progress Notes (Signed)
PULMONARY / CRITICAL CARE MEDICINE   Name: Cody Vincent MRN: [id number] DOB: 07/27/1940    ADMISSION DATE:  01/22/2015 CONSULTATION DATE:  8/11  REFERRING MD : EDP   CHIEF COMPLAINT:  S/p cardiac arrest, SBO, septic shock   INITIAL PRESENTATION:  74 year old male w/ h/o rather functional dementia. Resides at [facility]. Presented to the ER on 8/11 w/ N/V/D x 2 days. Became progressively hypotensive so EMS called. On ER arrival SBP was in 80s, creatinine was 5.53 and had lactic acid of 3.08. He was started on abx, given IV hydration and his lactic acid initially cleared. He was undergoing CT scan evaluation to work up SBO. While flat in CT scan he vomited, then developed hypoxia and subsequent cardio-pulmonary arrest. His time to ROSC was 17 minutes. He was intubated. Remained hypotensive. PCCM asked to admit w/ working dx of SBO, aspiration PNA and shock.   STUDIES:  Renal US 8/12>>>  SIGNIFICANT EVENTS: 8/11: admitted-->while in CT scan suffered 17 min PEA arrest. Admitted to ICU in MODS & refractory shock requiring 3 pressors. Placed on Nimbex d/t PF ratio <200.  Met w/ daughter Lattie Haw. Discussed poor prognosis, made DNR but cont current level of care w/ no further escalation.  8/12: BP holding on levo/vaso and epi gtts. CVP @ goal. WBC ct trending down, creatinine appeared to leveled off, ordered Renal US.   REVIEW OF SYSTEMS:  Unable   SUBJECTIVE: critically ill  VITAL SIGNS: Temp:  [96.5 F (35.8 C)-99.1 F (37.3 C)] 99 F (37.2 C) (08/12 0630) Pulse Rate:  [59-74] 60 (08/12 0630) Resp:  [22-36] 35 (08/12 0630) BP: (47-153)/(11-81) 102/46 mmHg (08/12 0630) SpO2:  [46 %-100 %] 100 % (08/12 0630) FiO2 (%):  [40 %-100 %] 40 % (08/12 0811) Weight:  [98.884 kg (218 lb)-105.6 kg (232 lb 12.9 oz)] 105.6 kg (232 lb 12.9 oz) (08/12 0400) HEMODYNAMICS: CVP:  [9 mmHg-12 mmHg] 10 mmHg VENTILATOR SETTINGS: Vent Mode:  [-] PRVC FiO2 (%):  [40 %-100 %] 40 % Set Rate:  [20 bmp-35 bmp] 35  bmp Vt Set:  [550 mL] 550 mL PEEP:  [5 cmH20-10 cmH20] 5 cmH20 Plateau Pressure:  [24 cmH20-29 cmH20] 26 cmH20 INTAKE / OUTPUT:  Intake/Output Summary (Last 24 hours) at 01/24/15 0828 Last data filed at 01/24/15 2197  Gross per 24 hour  Intake 10649.36 ml  Output   1470 ml  Net 9179.36 ml    PHYSICAL EXAMINATION: General:  Remains critically ill, still on three pressors. abd softer Neuro:  GCS currently on NMB gtt HEENT:  Orally intubated  Cardiovascular:  Loletha Grayer rrr paced on tele  Lungs: basilar rales  Abdomen:  Distended, tympanic hypoactive-->softer  GU: urine less cloudy but still low uop  Musculoskeletal:  Intact  Skin:  Cool and mottled   LABS:  CBC  Recent Labs Lab 01/18/2015 0920 02/05/2015 1215  WBC 20.0* 14.8*  HGB 12.7* 11.3*  HCT 38.0* 35.9*  PLT 210 217   Coag's  Recent Labs Lab 02/09/2015 1453  APTT 32  INR 1.53*   BMET  Recent Labs Lab 01/13/2015 1412 01/21/2015 1552 01/24/15 0554  NA 140 142 135  K 6.3* 6.3* 4.1  CL 108 110 100*  CO2 17* 22 22  BUN 67* 69* 70*  CREATININE 5.31* 5.50* 5.37*  GLUCOSE 172* 158* 315*   Electrolytes  Recent Labs Lab 02/02/2015 1354 02/09/2015 1412 02/04/2015 1552 01/24/15 0554  CALCIUM 7.9* 8.9 8.1* 7.3*  PHOS 6.1* 6.6*  --  1.5*  Sepsis Markers  Recent Labs Lab 02/01/2015 1224 02/09/2015 1409 01/31/2015 1450 01/27/2015 1453 01/24/15 0550  LATICACIDVEN 6.20* 6.1* 3.9*  --   --   PROCALCITON  --   --   --  13.23 49.11   ABG  Recent Labs Lab 01/22/2015 1215 02/07/2015 1625  PHART 7.101* 7.221*  PCO2ART 58.3* 43.9  PO2ART 199* 60.2*   Liver Enzymes  Recent Labs Lab 01/18/2015 0920 01/17/2015 1354 01/25/2015 1412 01/24/15 0554  AST 28  --   --   --   ALT 19  --   --   --   ALKPHOS 88  --   --   --   BILITOT 1.3*  --   --   --   ALBUMIN 3.7 2.7* 3.5 2.0*   Cardiac Enzymes  Recent Labs Lab 02/01/2015 0920  TROPONINI 0.03   Glucose  Recent Labs Lab 01/15/2015 1610 02/08/2015 1952 02/04/2015 2321  01/24/15 0322  GLUCAP 147* 216* 240* 220*    Imaging Dg Chest Port 1 View  02/05/2015   CLINICAL DATA:  Right IJ line placement  EXAM: PORTABLE CHEST - 1 VIEW  COMPARISON:  Portable chest x-ray of 01/31/2015  FINDINGS: Unfortunately, the defibrillator pad overlies the lower right heart shadow, and the tip of the right IJ central venous line may not be completely visualized. The right IJ central venous line tip is seen to a least the SVC-RA junction. No pneumothorax is seen. There is patchy airspace disease suspicious for pneumonia. Cardiomegaly is stable. Permanent pacemaker remains. The tip of the endotracheal tube is approximately 3.4 cm above the carina.  IMPRESSION: 1. The tip of the right IJ central venous line may be partially obscured by the defibrillator pad, but it appears to extend to at least the SVC -RA junction. No pneumothorax. 2. Patchy airspace disease suspicious for pneumonia.   Electronically Signed   By: Ivar Drape M.D.   On: 02/04/2015 13:54   Dg Chest Portable 1 View  01/28/2015   CLINICAL DATA:  Endotracheal and nasogastric tube placements.  EXAM: PORTABLE CHEST - 1 VIEW  COMPARISON:  01/28/2015 at 1010 hours  FINDINGS: Endotracheal tube has been placed and terminates approximately 3 cm above the carina. Enteric tube courses into the left upper abdomen with tip not imaged. Cardiac silhouette remains mildly enlarged with a dual lead pacemaker again noted. There is increased pulmonary vascular congestion with mild, patchy opacities in the right greater than left mid and lower lungs. No segmental airspace consolidation, pleural effusion, or pneumothorax is identified.  IMPRESSION: 1. Endotracheal and enteric tubes as above. 2. Increased pulmonary vascular congestion with likely mild edema.   Electronically Signed   By: Logan Bores   On: 02/01/2015 12:49   Dg Chest Portable 1 View  01/25/2015   CLINICAL DATA:  Nausea vomiting and diarrhea for 2 days.  EXAM: PORTABLE CHEST - 1 VIEW   COMPARISON:  10/23/2014  FINDINGS: Left chest wall pacer device is noted with lead in the right atrial appendage and right ventricle. The heart size appears normal. There is no pleural effusion or edema. No airspace consolidation identified. Lung volumes appear low.  IMPRESSION: 1. Low lung volumes.   Electronically Signed   By: Kerby Moors M.D.   On: 01/14/2015 10:39   Dg Abd Portable 2v  01/13/2015   CLINICAL DATA:  Nausea, vomiting.  EXAM: PORTABLE ABDOMEN - 2 VIEW  COMPARISON:  August 10, 2014.  FINDINGS: No definite pneumoperitoneum is noted. There is interval  development of severe small bowel dilatation concerning for distal small bowel obstruction. Phlebolith is noted in the pelvis.  IMPRESSION: Interval development of severe small bowel dilatation consistent with distal small bowel obstruction.   Electronically Signed   By: Marijo Conception, M.D.   On: 01/21/2015 10:39     ASSESSMENT / PLAN:  PULMONARY OETT 8/11>>> A: Acute hypoxic and hypercarbic respiratory arrest (P/F ratio <200) Hypoxic respiratory failure in setting of aspiration PNA and evolving ARDS P:   ards protocol NMB protocol given P/F ratio <200 and difficulty oxygenating; will plan on stopping NMB on 8/13 fent and versed gtt; to continue for now F/u Sputum culture   CARDIOVASCULAR CVL right IJ CVL 8/11>>>> A:  Refractory Septic shock/MODS S/p cardiopulmonary PEA arrest (17 minutes to ROSC in spite of provider ACLS) H/o symptomatic bradycardia w/ PPM  > on three pressors: levo/vaso/epi. CVP at goal. Holding goal MAP.  P:  Volume resuscitation completed; will keep CVP goal 8-12 MAP goal > 65 On levophed, vasopressin and, epineprhine gtt -->wean epi off first   RENAL A:   Acute renal failure-->appears to have hit plateau  + anion gap acidosis (lactic acidosis)-->s/p arrest and persistent shock-->acidosis improved  Hyperkalemia--> resolved  Hypophosphatemia  P:   Cont IV hydration w/ goal to keep even  volume status  Renal dose meds D/c bicarb gtt and follow chemistry  Will get bedside renal US this am.  Cont strict I&O  GASTROINTESTINAL A:   N/V and abd pain SBO-->abd softer  P:   NPO Bowel rest NGT to LIWS PPI  Repeat flat plat abd Will hold off on CT abd/pelvis for now   HEMATOLOGIC A:   Anemia of chronic disease  Leukocytosis  Mild coagulopathy in setting of sepsis  P:  [place] heparin AND SCDs as he is on NMB Trend CBC Transfuse for Hgb < 7   INFECTIOUS A:   Septic shock likely [place] source +/- bacterial translocation from gut  Aspiration PNA  P:   BCx2 8/11>>> UC 8/11>>> Sputum: resp culture 8/11: few gpc pairs/chains>>> Abx: vanc 8/11>>> Zosyn 8/11>>>  ENDOCRINE A:   Hyperglycemia   P:   ssi protocol   NEUROLOGIC A:   Acute encephalopathy in setting of sepsis, acidosis and s/p cardiac arrest: Concerned about possible hypoxic injury  P:   RASS goal: -5 NMB protocol w/ BIS monitoring  Fentanyl and versed gtt   FAMILY  - Updates: pending   - Inter-disciplinary family meet or Palliative Care meeting due by: 8/16     TODAY'S SUMMARY:  refractory shock MODS. Working dx: prob UTI,  SBO w/ bacterial translocation vs ischemic bowel.he is holding on epinephrine to vasopressin and levophed. His acidosis has improved. Today will d/c bicarb gtt, replace PO4, ck bedside renal US (if this is positive could be something we could treat), wean pressors. Will cont NMB for another 24 hrs. For now we will cont current aggressive care. Will update family. He is DNR and no escalation of care; however if he does have hydro we would escalate rx.   Erick Colace ACNP-BC [place] Pager # [phone] OR # [phone] if no answer   01/24/2015, 8:28 AM

## 2015-01-24 NOTE — Procedures (Signed)
Central Venous Catheter Insertion Procedure Note MAEL DELAP [id number] [birth year]  Procedure: Insertion of Central Venous Catheter Indications: Assessment of intravascular volume, Drug and/or fluid administration and Frequent blood sampling  Procedure Details Consent: Unable to obtain consent because of emergent medical necessity. Time Out: Verified patient identification, verified procedure, site/side was marked, verified correct patient position, special equipment/implants available, medications/allergies/relevent history reviewed, required imaging and test results available.  Performed Real time [place] was used to ID and cannulate the vessel   Maximum sterile technique was used including antiseptics, cap, gloves, gown, hand hygiene, mask and sheet. Skin prep: Chlorhexidine; local anesthetic administered A antimicrobial bonded/coated triple lumen catheter was placed in the right internal jugular vein using the Seldinger technique.  Evaluation Blood flow good Complications: No apparent complications Patient did tolerate procedure well. Chest X-ray ordered to verify placement.  CXR: pending.  Phillipe Clemon,PETE 01/24/2015, 11:11 AM

## 2015-01-24 NOTE — Procedures (Signed)
Arterial Catheter Insertion Procedure Note Cody Vincent [id number] [birth year]  Procedure: Insertion of Arterial Catheter  Indications: Blood pressure monitoring and Frequent blood sampling  Procedure Details Consent: Unable to obtain consent because of emergent medical necessity. Time Out: Verified patient identification, verified procedure, site/side was marked, verified correct patient position, special equipment/implants available, medications/allergies/relevent history reviewed, required imaging and test results available.  Performed  Maximum sterile technique was used including cap, gloves, gown, hand hygiene, mask and sheet. Skin prep: Chlorhexidine; local anesthetic administered 20 gauge catheter was inserted into left radial artery using the Seldinger technique.  Evaluation Blood flow good; BP tracing good. Complications: No apparent complications.   Suzan Garibaldi 01/24/2015

## 2015-01-24 NOTE — Progress Notes (Signed)
ANTIBIOTIC CONSULT NOTE - FOLLOW UP  Pharmacy Consult for vancomycin and zosyn Indication: sepsis  No Known Allergies  Patient Measurements: Height:  (170.2 cm) Weight: 232 lb 12.9 oz (105.6 kg) IBW/kg (Calculated) : 66.1  Vital Signs: Temp: 99 F (37.2 C) (08/12 0630) Temp Source: Core (Comment) (08/12 0400) BP: 102/46 mmHg (08/12 0630) Pulse Rate: 60 (08/12 0630) Intake/Output from previous day: 08/11 0701 - 08/12 0700 In: 10649.4 [I.V.:9109.4; NG/GT:80; IV Piggyback:1460] Out: 1470 [Urine:770] Intake/Output from this shift:    Labs:  Recent Labs  01/20/2015 0920 01/17/2015 1215  01/15/2015 1412 02/09/2015 1552 01/24/15 0554  WBC 20.0* 14.8*  --   --   --   --   HGB 12.7* 11.3*  --   --   --   --   PLT 210 217  --   --   --   --   CREATININE 5.53* 5.38*  < > 5.31* 5.50* 5.37*  < > = values in this interval not displayed. Estimated Creatinine Clearance: 14.4 mL/min (by C-G formula based on Cr of 5.37). No results for input(s): VANCOTROUGH, VANCOPEAK, VANCORANDOM, GENTTROUGH, GENTPEAK, GENTRANDOM, TOBRATROUGH, TOBRAPEAK, TOBRARND, AMIKACINPEAK, AMIKACINTROU, AMIKACIN in the last 72 hours.   Microbiology: Recent Results (from the past 720 hour(s))  Blood culture (routine x 2)     Status: None (Preliminary result)   Collection Time: 02/12/2015 10:09 AM  Result Value Ref Range Status   Specimen Description BLOOD RIGHT ANTECUBITAL  Final   Special Requests BOTTLES DRAWN AEROBIC AND ANAEROBIC 3 ML  Final   Culture PENDING  Incomplete   Report Status PENDING  Incomplete  MRSA PCR Screening     Status: Abnormal   Collection Time: 02/03/2015  2:45 PM  Result Value Ref Range Status   MRSA by PCR POSITIVE (A) NEGATIVE Final    Comment:        The GeneXpert MRSA Assay (FDA approved for NASAL specimens only), is one component of a comprehensive MRSA colonization surveillance program. It is not intended to diagnose MRSA infection nor to guide or monitor treatment for MRSA  infections. RESULT CALLED TO, READ BACK BY AND VERIFIED WITH: FAPFAW A. AT 1700 ON 02/05/2015 BY INGLEE     Anti-infectives    Start     Dose/Rate Route Frequency Ordered Stop   01/24/2015 1430  vancomycin (VANCOCIN) IVPB 1000 mg/200 mL premix     1,000 mg 200 mL/hr over 60 Minutes Intravenous Every 48 hours 02/12/2015 1415     01/19/2015 1430  piperacillin-tazobactam (ZOSYN) IVPB 2.25 g     2.25 g 100 mL/hr over 30 Minutes Intravenous 3 times per day 01/18/2015 1415     02/02/2015 1400  piperacillin-tazobactam (ZOSYN) IVPB 3.375 g  Status:  Discontinued     3.375 g 100 mL/hr over 30 Minutes Intravenous  Once 01/22/2015 1349 01/29/2015 1415   01/28/2015 1400  vancomycin (VANCOCIN) IVPB 1000 mg/200 mL premix  Status:  Discontinued     1,000 mg 200 mL/hr over 60 Minutes Intravenous  Once 01/19/2015 1349 02/01/2015 1415   02/07/2015 1015  vancomycin (VANCOCIN) IVPB 1000 mg/200 mL premix     1,000 mg 200 mL/hr over 60 Minutes Intravenous  Once 02/09/2015 1003 02/11/2015 1236   01/29/2015 1015  piperacillin-tazobactam (ZOSYN) IVPB 4.5 g  Status:  Discontinued     4.5 g 200 mL/hr over 30 Minutes Intravenous  Once 01/22/2015 1003 02/12/2015 1005   02/09/2015 1015  piperacillin-tazobactam (ZOSYN) IVPB 3.375 g     3.375  g 100 mL/hr over 30 Minutes Intravenous STAT 01/16/2015 1006 01/18/2015 1236      Assessment: 74 y.o. male presented to the ED on 8/12 from [facility] facility with nausea, vomiting and diarrhea for 2 days, he got phenergan last PM, no further vomiting, but ongoing diarrhea, with dark brown emesis, distended abdomen, and hypotensive. EMS gave him a bolus of fluid enroute. Admit BP 81/46 HR 64. Labs shows creatinine up to 5.53, lactate was initially 3.08, but later up to 6.2. WBC 20K, UA shows probable UTI. 2 view abdomen showed interval SBO, CXR shows low lung volumes. During work up he has had a respiratory arrest, possible from aspiration, and is now intubated. He was down for 17 minutes. He is intubated,  hypotensive. Pharmacy consulted to dose vancomycin and zosyn for sepsis.   Today, 01/24/2015:  Tmax 99.1  Wbc 14.8  PCT up 49  Scr elevated at 5.37 (crcl~14 CG, 12N)  8/11 >>vanc  >> 8/11 >>zosyn  >>    8/11 blood:x2: ngtd 8/11 TA:  8/11 MRSA PCR (+) on chlx and bactroban   Goal of Therapy:  Vancomycin trough level 15-20 mcg/ml  Plan:  -  Vancomycin 2gm IV  given on 8/11 (1gm at 11a and 1gm at 1626)-- will d/c standing vanc and check random at 1600 on 8/13 to assess clearance.  Will redose if level is <20 - change Zosyn  to 2.25mg  IV q6h - Follow renal function closely and cultures   Banyan Goodchild P 01/24/2015,7:54 AM

## 2015-01-24 NOTE — Progress Notes (Signed)
CSW consulted to assist with d/c planning needs. Pt is from [facility]  ( Assisted Living Facility ). Pt requiring vent at this time. CSW will be available to assist with d/c planning. SNF placement may be required at time of d/c.  Cori Razor LCSW [phone]

## 2015-01-24 NOTE — Progress Notes (Signed)
Initial Nutrition Assessment  DOCUMENTATION CODES:   Obesity unspecified  INTERVENTION:  - RD will continue to monitor GOC and needs related to this and medical course  NUTRITION DIAGNOSIS:   Increased nutrient needs related to acute illness as evidenced by estimated needs.  GOAL:   Provide needs based on ASPEN/SCCM guidelines  MONITOR:   Vent status, Weight trends, Labs, I & O's  REASON FOR ASSESSMENT:   Ventilator  ASSESSMENT:   74 year old male w/ h/o rather functional dementia. Resides at [facility]. Presented to the ER on 8/11 w/ N/V/D x 2 days. Became progressively hypotensive so EMS called. On ER arrival SBP was in 80s, creatinine was 5.53 and had lactic acid of 3.08. He was started on abx, given IV hydration and his lactic acid initially cleared. He was undergoing CT scan evaluation to work up SBO. While flat in CT scan he vomited, then developed hypoxia and subsequent cardio-pulmonary arrest. His time to ROSC was 17 minutes. He was intubated. Remained hypotensive. PCCM asked to admit w/ working dx of SBO, aspiration PNA and shock.   Pt seen for new vent. BMI indicates obesity. Pt NPO with OGT in place on vent.  Patient is currently intubated on ventilator support MV: 18.9 L/min Temp (24hrs), Avg:98.3 F (36.8 C), Min:96.5 F (35.8 C), Max:99.1 F (37.3 C)  Propofol: none  No family present to provide PTA information. Per weight hx review, pt has been gaining weight recently. Per CCM NP and MD note yesterday afternoon: Given his multiple organ failure he was made DO NOT RESUSCITATE with no escalation of care.   Will continue to monitor GOC and status related to pressor support. Unable to perform physical assessment as pt is chemically paralyzed but no visible wasting present.  Medications reviewed. Labs reviewed; CBGs: 97 and 107 mg/dL, Na: 161 mmol/L, K: 2.9 mmol/L, Cl: 97 mmol/L, Ca: 7.9 mg/dL, Phos: 2.3 mg/dL, Mg: 1.5 mg/dL.  Drips: Levophed @ 32 mcg/min,  Vasopressin @ 0.03 units/min, Fentanyl @ 250 mcg/hr, Epinephrine @ 10 mcg/min, Nimbex @ 2 mcg/kg/min.    Diet Order:  NPO  Skin:  Reviewed, no issues  Last BM:  8/11  Height:   Ht Readings from Last 1 Encounters:  Feb 05, 2015  (1.702 m)    Weight:   Wt Readings from Last 1 Encounters:  01/24/15 232 lb 12.9 oz (105.6 kg)    Ideal Body Weight:  67.27 kg (kg)  BMI:  Body mass index is 36.45 kg/(m^2).  Estimated Nutritional Needs:   Kcal:  0960-4540  Protein:  127 grams  Fluid:  1.7-2 L/day  EDUCATION NEEDS:   No education needs identified at this time     Trenton Gammon, RD, LDN Inpatient Clinical Dietitian Pager # [phone] After hours/weekend pager # [phone]

## 2015-01-25 ENCOUNTER — Inpatient Hospital Stay (HOSPITAL_COMMUNITY): Payer: Medicare Other

## 2015-01-25 DIAGNOSIS — J9601 Acute respiratory failure with hypoxia: Secondary | ICD-10-CM

## 2015-01-25 DIAGNOSIS — N179 Acute kidney failure, unspecified: Secondary | ICD-10-CM

## 2015-01-25 DIAGNOSIS — R579 Shock, unspecified: Secondary | ICD-10-CM

## 2015-01-25 DIAGNOSIS — E872 Acidosis: Secondary | ICD-10-CM

## 2015-01-25 DIAGNOSIS — J80 Acute respiratory distress syndrome: Secondary | ICD-10-CM

## 2015-01-25 LAB — LACTIC ACID, PLASMA
Lactic Acid, Venous: 5.2 mmol/L (ref 0.5–2.0)
Lactic Acid, Venous: 3.3 mmol/L (ref 0.5–2.0)

## 2015-01-25 LAB — RENAL FUNCTION PANEL
Albumin: 1.7 g/dL — ABNORMAL LOW (ref 3.5–5.0)
Anion gap: 16 — ABNORMAL HIGH (ref 5–15)
BUN: 68 mg/dL — ABNORMAL HIGH (ref 6–20)
CO2: 16 mmol/L — ABNORMAL LOW (ref 22–32)
Calcium: 6.8 mg/dL — ABNORMAL LOW (ref 8.9–10.3)
Chloride: 101 mmol/L (ref 101–111)
Creatinine, Ser: 5.04 mg/dL — ABNORMAL HIGH (ref 0.61–1.24)
GFR calc Af Amer: 12 mL/min — ABNORMAL LOW (ref >60)
GFR calc non Af Amer: 10 mL/min — ABNORMAL LOW (ref >60)
GLUCOSE: 277 mg/dL — AB (ref 65–99)
PHOSPHORUS: 3.4 mg/dL (ref 2.5–4.6)
Potassium: 3.7 mmol/L (ref 3.5–5.1)
SODIUM: 133 mmol/L — AB (ref 135–145)

## 2015-01-25 LAB — PROCALCITONIN: Procalcitonin: 36.62 ng/mL

## 2015-01-25 LAB — GLUCOSE, CAPILLARY
GLUCOSE-CAPILLARY: 112 mg/dL — AB (ref 65–99)
GLUCOSE-CAPILLARY: 133 mg/dL — AB (ref 65–99)
GLUCOSE-CAPILLARY: 195 mg/dL — AB (ref 65–99)
GLUCOSE-CAPILLARY: 201 mg/dL — AB (ref 65–99)
GLUCOSE-CAPILLARY: 34 mg/dL — AB (ref 65–99)
GLUCOSE-CAPILLARY: 54 mg/dL — AB (ref 65–99)
GLUCOSE-CAPILLARY: 78 mg/dL (ref 65–99)
Glucose-Capillary: 77 mg/dL (ref 65–99)
Glucose-Capillary: 43 mg/dL — CL (ref 65–99)
Glucose-Capillary: 59 mg/dL — ABNORMAL LOW (ref 65–99)

## 2015-01-25 LAB — BLOOD GAS, ARTERIAL
ACID-BASE DEFICIT: 5.1 mmol/L — AB (ref 0.0–2.0)
BICARBONATE: 17.9 meq/L — AB (ref 20.0–24.0)
Drawn by: 235321
FIO2: 0.3
LHR: 35 {breaths}/min
MECHVT: 550 mL
O2 Saturation: 87.7 %
PCO2 ART: 28 mmHg — AB (ref 35.0–45.0)
PEEP/CPAP: 5 cmH2O
PH ART: 7.423 (ref 7.350–7.450)
Patient temperature: 99.5
TCO2: 16.7 mmol/L (ref 0–100)
pO2, Arterial: 56.4 mmHg — ABNORMAL LOW (ref 80.0–100.0)

## 2015-01-25 LAB — CBC WITH DIFFERENTIAL/PLATELET
Basophils Absolute: 0 10*3/uL (ref 0.0–0.1)
Basophils Relative: 0 % (ref 0–1)
EOS ABS: 0.4 10*3/uL (ref 0.0–0.7)
EOS PCT: 5 % (ref 0–5)
HCT: 25.1 % — ABNORMAL LOW (ref 39.0–52.0)
Hemoglobin: 8.4 g/dL — ABNORMAL LOW (ref 13.0–17.0)
LYMPHS PCT: 7 % — AB (ref 12–46)
Lymphs Abs: 0.6 10*3/uL — ABNORMAL LOW (ref 0.7–4.0)
MCH: 28.8 pg (ref 26.0–34.0)
MCHC: 33.5 g/dL (ref 30.0–36.0)
MCV: 86 fL (ref 78.0–100.0)
MONO ABS: 0.2 10*3/uL (ref 0.1–1.0)
Monocytes Relative: 2 % — ABNORMAL LOW (ref 3–12)
NEUTROS ABS: 7.1 10*3/uL (ref 1.7–7.7)
Neutrophils Relative %: 86 % — ABNORMAL HIGH (ref 43–77)
Platelets: 94 10*3/uL — ABNORMAL LOW (ref 150–400)
RBC: 2.92 MIL/uL — AB (ref 4.22–5.81)
RDW: 14 % (ref 11.5–15.5)
WBC Morphology: INCREASED
WBC: 8.3 10*3/uL (ref 4.0–10.5)

## 2015-01-25 LAB — TROPONIN I: TROPONIN I: 0.17 ng/mL — AB (ref <0.031)

## 2015-01-25 LAB — MAGNESIUM: Magnesium: 1.2 mg/dL — ABNORMAL LOW (ref 1.7–2.4)

## 2015-01-25 MED ORDER — MAGNESIUM SULFATE IN D5W 10-5 MG/ML-% IV SOLN
1.0000 g | Freq: Once | INTRAVENOUS | Status: AC
  Administered 2015-01-25: 1 g via INTRAVENOUS
  Filled 2015-01-25: qty 100

## 2015-01-25 MED ORDER — DEXTROSE 50 % IV SOLN
50.0000 mL | Freq: Once | INTRAVENOUS | Status: AC
  Administered 2015-01-25: 50 mL via INTRAVENOUS

## 2015-01-25 MED ORDER — DEXTROSE 50 % IV SOLN
INTRAVENOUS | Status: AC
  Administered 2015-01-25: 50 mL
  Filled 2015-01-25: qty 50

## 2015-01-25 MED ORDER — DEXTROSE 10 % IV SOLN
INTRAVENOUS | Status: DC
  Administered 2015-01-25: 01:00:00 via INTRAVENOUS
  Administered 2015-01-25 – 2015-01-26 (×3): 75 mL/h via INTRAVENOUS
  Administered 2015-01-26: 05:00:00 via INTRAVENOUS
  Administered 2015-01-28: 75 mL/h via INTRAVENOUS
  Administered 2015-01-28: via INTRAVENOUS
  Filled 2015-01-25 (×3): qty 1000

## 2015-01-25 MED ORDER — CHLORHEXIDINE GLUCONATE CLOTH 2 % EX PADS
6.0000 | MEDICATED_PAD | Freq: Every day | CUTANEOUS | Status: AC
  Administered 2015-01-25 – 2015-01-28 (×3): 6 via TOPICAL

## 2015-01-25 MED ORDER — DEXTROSE 50 % IV SOLN
25.0000 mL | Freq: Once | INTRAVENOUS | Status: AC
  Administered 2015-01-25: 25 mL via INTRAVENOUS

## 2015-01-25 MED ORDER — DEXTROSE 50 % IV SOLN
INTRAVENOUS | Status: AC
  Filled 2015-01-25: qty 50

## 2015-01-25 NOTE — Progress Notes (Signed)
Patient ID: Cody Vincent, male   DOB: 12/12/1940, 74 y.o.   MRN: [id number]  General Surgery - [facility]  HD#: 2  Subjective: Patient in ICU, intubated, sedated, paralyzed.  Patient DNR with no escalation per family.  Pressors weaning.  Objective: Vital signs in last 24 hours: Temp:  [98.4 F (36.9 C)-99.7 F (37.6 C)] 99.3 F (37.4 C) (08/13 0700) Pulse Rate:  [59-67] 64 (08/13 0700) Resp:  [28-35] 35 (08/13 0700) BP: (108-178)/(32-54) 108/54 mmHg (08/12 2300) SpO2:  [92 %-100 %] 95 % (08/13 0700) Arterial Line BP: (72-173)/(23-69) 157/51 mmHg (08/13 0737) FiO2 (%):  [30 %-40 %] 40 % (08/13 0746) Weight:  [111.9 kg (246 lb 11.1 oz)] 111.9 kg (246 lb 11.1 oz) (08/13 0541) Last BM Date: 01/29/2015  Intake/Output from previous day: 08/12 0701 - 08/13 0700 In: 7804.5 [I.V.:6194.5; NG/GT:90; IV Piggyback:1510] Out: 1095 [Urine:845; Emesis/NG output:250] Intake/Output this shift:    Physical Exam: HEENT - sclerae clear, mucous membranes moist Neck - soft, right IJ line Cor - RRR Abdomen - soft, no mass; no surgical incisions; NG with small bilious  Lab Results:   Recent Labs  01/24/15 0810 01/25/15 0500  WBC 8.6 8.3  HGB 10.1* 8.4*  HCT 28.8* 25.1*  PLT 139* 94*   BMET  Recent Labs  01/24/15 2145 01/25/15 0500  NA 136 133*  K 3.7 3.7  CL 102 101  CO2 19* 16*  GLUCOSE 133* 277*  BUN 74* 68*  CREATININE 5.22* 5.04*  CALCIUM 7.1* 6.8*   PT/INR  Recent Labs  01/22/2015 1453  LABPROT 18.5*  INR 1.53*   Comprehensive Metabolic Panel:    Component Value Date/Time   NA 133* 01/25/2015 0500   NA 136 01/24/2015 2145   K 3.7 01/25/2015 0500   K 3.7 01/24/2015 2145   CL 101 01/25/2015 0500   CL 102 01/24/2015 2145   CO2 16* 01/25/2015 0500   CO2 19* 01/24/2015 2145   BUN 68* 01/25/2015 0500   BUN 74* 01/24/2015 2145   CREATININE 5.04* 01/25/2015 0500   CREATININE 5.22* 01/24/2015 2145   CREATININE 1.16 10/11/2013 1546   CREATININE 1.27 03/20/2012 1553   GLUCOSE 277* 01/25/2015 0500   GLUCOSE 133* 01/24/2015 2145   CALCIUM 6.8* 01/25/2015 0500   CALCIUM 7.1* 01/24/2015 2145   AST 28 01/15/2015 0920   AST 23 09/08/2014 1459   ALT 19 02/12/2015 0920   ALT 15 09/08/2014 1459   ALKPHOS 88 01/27/2015 0920   ALKPHOS 100 09/08/2014 1459   BILITOT 1.3* 02/12/2015 0920   BILITOT 0.7 09/08/2014 1459   PROT 6.9 01/29/2015 0920   PROT 5.8* 09/08/2014 1459   ALBUMIN 1.7* 01/25/2015 0500   ALBUMIN 1.9* 01/24/2015 2145    Studies/Results: US Renal Port  01/24/2015   CLINICAL DATA:  Acute respiratory distress syndrome with elevated BUN and creatinine.  EXAM: RENAL / URINARY TRACT ULTRASOUND COMPLETE  COMPARISON:  CT scan 01/11/2014  FINDINGS: Right Kidney:  Length: 9.9 cm. Mild renal cortical thinning and increased echogenicity suggesting medical renal disease. No hydronephrosis. There is a small exophytic cystic lesion projecting off the upper pole region of the kidney which measures 1.4 x 1.6 x 1.6 cm.  Left Kidney:  Length: 10.5 cm. Normal renal cortical thickness and echogenicity without focal lesions or hydronephrosis. There is a 8 mm midpole calculus noted.  Bladder:  Decompressed by a Foley catheter.  IMPRESSION: 1. Right renal cortical thinning and increased echogenicity suggesting medical renal disease. The  left kidney appears normal. 2. No hydronephrosis. 3. 1.6 cm upper pole right renal cystic lesion is likely a complex cyst. No change since prior CT scan. 4. 8 mm midpole left renal calculus. 5. Bladder decompressed by Foley catheter.   Electronically Signed   By: Rudie Meyer M.D.   On: 01/24/2015 10:42   Dg Chest Port 1 View  February 14, 2015   CLINICAL DATA:  Right IJ line placement  EXAM: PORTABLE CHEST - 1 VIEW  COMPARISON:  Portable chest x-ray of 02/14/15  FINDINGS: Unfortunately, the defibrillator pad overlies the lower right heart shadow, and the tip of the right IJ central venous line may not be completely  visualized. The right IJ central venous line tip is seen to a least the SVC-RA junction. No pneumothorax is seen. There is patchy airspace disease suspicious for pneumonia. Cardiomegaly is stable. Permanent pacemaker remains. The tip of the endotracheal tube is approximately 3.4 cm above the carina.  IMPRESSION: 1. The tip of the right IJ central venous line may be partially obscured by the defibrillator pad, but it appears to extend to at least the SVC -RA junction. No pneumothorax. 2. Patchy airspace disease suspicious for pneumonia.   Electronically Signed   By: Dwyane Dee M.D.   On: 14-Feb-2015 13:54   Dg Chest Portable 1 View  February 14, 2015   CLINICAL DATA:  Endotracheal and nasogastric tube placements.  EXAM: PORTABLE CHEST - 1 VIEW  COMPARISON:  02/14/15 at 1010 hours  FINDINGS: Endotracheal tube has been placed and terminates approximately 3 cm above the carina. Enteric tube courses into the left upper abdomen with tip not imaged. Cardiac silhouette remains mildly enlarged with a dual lead pacemaker again noted. There is increased pulmonary vascular congestion with mild, patchy opacities in the right greater than left mid and lower lungs. No segmental airspace consolidation, pleural effusion, or pneumothorax is identified.  IMPRESSION: 1. Endotracheal and enteric tubes as above. 2. Increased pulmonary vascular congestion with likely mild edema.   Electronically Signed   By: Sebastian Ache   On: Feb 14, 2015 12:49   Dg Chest Portable 1 View  2015/02/14   CLINICAL DATA:  Nausea vomiting and diarrhea for 2 days.  EXAM: PORTABLE CHEST - 1 VIEW  COMPARISON:  10/23/2014  FINDINGS: Left chest wall pacer device is noted with lead in the right atrial appendage and right ventricle. The heart size appears normal. There is no pleural effusion or edema. No airspace consolidation identified. Lung volumes appear low.  IMPRESSION: 1. Low lung volumes.   Electronically Signed   By: Signa Kell M.D.   On: 02-14-2015 10:39    Dg Abd Portable 1v  01/24/2015   CLINICAL DATA:  Abdominal distention.  EXAM: PORTABLE ABDOMEN - 1 VIEW  COMPARISON:  Abdominal radiographs from 1 day prior.  FINDINGS: An enteric tube is seen in the proximal stomach, with the tip likely in the gastric fundus, although evaluation is limited by motion degradation. There are persistent mildly to moderately dilated small bowel loops in the mid abdomen measuring up to 4.4 cm diameter, improved in the interval, previously 6.0 cm maximum diameter. No evidence of pneumatosis or pneumoperitoneum. Degenerative changes are present throughout the visualized thoracolumbar spine. A catheter tip overlies the midline deep pelvis.  IMPRESSION: Interval decreased caliber of persistently mildly to moderately dilated mid small bowel loops. Findings are nonspecific and represent either improving obstruction or improving ileus.   Electronically Signed   By: Delbert Phenix M.D.   On: 01/24/2015 09:38  Dg Abd Portable 2v  02/03/2015   CLINICAL DATA:  Nausea, vomiting.  EXAM: PORTABLE ABDOMEN - 2 VIEW  COMPARISON:  August 10, 2014.  FINDINGS: No definite pneumoperitoneum is noted. There is interval development of severe small bowel dilatation concerning for distal small bowel obstruction. Phlebolith is noted in the pelvis.  IMPRESSION: Interval development of severe small bowel dilatation consistent with distal small bowel obstruction.   Electronically Signed   By: Lupita Raider, M.D.   On: 01/31/2015 10:39    Anti-infectives: Anti-infectives    Start     Dose/Rate Route Frequency Ordered Stop   01/24/15 1100  piperacillin-tazobactam (ZOSYN) IVPB 2.25 g     2.25 g 100 mL/hr over 30 Minutes Intravenous Every 6 hours 01/24/15 0802     02/10/2015 1430  vancomycin (VANCOCIN) IVPB 1000 mg/200 mL premix  Status:  Discontinued     1,000 mg 200 mL/hr over 60 Minutes Intravenous Every 48 hours 01/14/2015 1415 01/24/15 0802   02/03/2015 1430  piperacillin-tazobactam (ZOSYN) IVPB  2.25 g  Status:  Discontinued     2.25 g 100 mL/hr over 30 Minutes Intravenous 3 times per day 02/08/2015 1415 01/24/15 0802   01/29/2015 1400  piperacillin-tazobactam (ZOSYN) IVPB 3.375 g  Status:  Discontinued     3.375 g 100 mL/hr over 30 Minutes Intravenous  Once 01/25/2015 1349 01/25/2015 1415   02/06/2015 1400  vancomycin (VANCOCIN) IVPB 1000 mg/200 mL premix  Status:  Discontinued     1,000 mg 200 mL/hr over 60 Minutes Intravenous  Once 01/15/2015 1349 01/13/2015 1415   01/25/2015 1015  vancomycin (VANCOCIN) IVPB 1000 mg/200 mL premix     1,000 mg 200 mL/hr over 60 Minutes Intravenous  Once 01/25/2015 1003 01/15/2015 1236   01/22/2015 1015  piperacillin-tazobactam (ZOSYN) IVPB 4.5 g  Status:  Discontinued     4.5 g 200 mL/hr over 30 Minutes Intravenous  Once 01/14/2015 1003 01/29/2015 1005   01/20/2015 1015  piperacillin-tazobactam (ZOSYN) IVPB 3.375 g     3.375 g 100 mL/hr over 30 Minutes Intravenous STAT 02/12/2015 1006 02/06/2015 1236      Assessment & Plans: 1. Septic shock  IV Vanco, Zosyn empirically  ?source - consider intra-abdominal source - CT scan may be helpful if patient stabilizes enough to undergo scan  Continue NPO, NG 2.  Respiratory failure / aspiration event 3.  Cardiac arrest / resuscitation  ? Neurologic status - potential for recovery  Will follow.  No role for surgical intervention at this point.  Prognosis poor.     Angee Gupton C. 01/25/2015

## 2015-01-25 NOTE — Progress Notes (Signed)
eLink Physician-Brief Progress Note Patient Name: Cody Vincent DOB: 19-Jan-1941 MRN: [id number]   Date of Service  01/25/2015  HPI/Events of Note  hypoglycemic  eICU Interventions  D10 started & increased to 75/h Dc insulin coverage     Intervention Category Intermediate Interventions: Other:  Cody Vincent,RAKESH V. 01/25/2015, 3:45 AM

## 2015-01-25 NOTE — Progress Notes (Signed)
PULMONARY / CRITICAL CARE MEDICINE   Name: Cody Vincent MRN: [id number] DOB: February 23, 1941    ADMISSION DATE:  02/06/2015 CONSULTATION DATE:  8/11  REFERRING MD : EDP   CHIEF COMPLAINT:  S/p cardiac arrest, SBO, septic shock   INITIAL PRESENTATION:  74 year old male w/ h/o rather functional dementia. Resides at [facility]. Presented to the ER on 8/11 w/ N/V/D x 2 days. Became progressively hypotensive so EMS called. On ER arrival SBP was in 80s, creatinine was 5.53 and had lactic acid of 3.08. He was started on abx, given IV hydration and his lactic acid initially cleared. He was undergoing CT scan evaluation to work up SBO. While flat in CT scan he vomited, then developed hypoxia and subsequent cardio-pulmonary arrest. His time to ROSC was 17 minutes. He was intubated. Remained hypotensive. PCCM asked to admit w/ working dx of SBO, aspiration PNA and shock.   STUDIES:  Renal US 8/12>>>  SIGNIFICANT EVENTS: 8/11: admitted-->while in CT scan suffered 17 min PEA arrest. Admitted to ICU in MODS & refractory shock requiring 3 pressors. Placed on Nimbex d/t PF ratio <200.  Met w/ daughter Lattie Haw. Discussed poor prognosis, made DNR but cont current level of care w/ no further escalation.  8/12: BP holding on levo/vaso and epi gtts. CVP @ goal. WBC ct trending down, creatinine appeared to leveled off, ordered Renal US.    SUBJECTIVE/OVERNIGHT/INTERVAL HX 01/25/2015 : 40% fio2. On nimbex , fent. Versed -> BIS 32. On epi and levophed gtt. Lactic acidosis still persists   VITAL SIGNS: Temp:  [98.4 F (36.9 C)-99.7 F (37.6 C)] 99.1 F (37.3 C) (08/13 0800) Pulse Rate:  [59-67] 66 (08/13 0800) Resp:  [28-35] 35 (08/13 0800) BP: (108-178)/(32-54) 108/54 mmHg (08/12 2300) SpO2:  [92 %-100 %] 95 % (08/13 0800) Arterial Line BP: (72-173)/(23-69) 133/49 mmHg (08/13 0810) FiO2 (%):  [30 %-40 %] 40 % (08/13 0800) Weight:  [111.9 kg (246 lb 11.1 oz)] 111.9 kg (246 lb 11.1 oz) (08/13  0541) HEMODYNAMICS: CVP:  [5 mmHg-24 mmHg] 12 mmHg VENTILATOR SETTINGS: Vent Mode:  [-] PRVC FiO2 (%):  [30 %-40 %] 40 % Set Rate:  [35 bmp] 35 bmp Vt Set:  [550 mL] 550 mL PEEP:  [5 cmH20] 5 cmH20 Plateau Pressure:  [23 cmH20-27 cmH20] 27 cmH20 INTAKE / OUTPUT:  Intake/Output Summary (Last 24 hours) at 01/25/15 0953 Last data filed at 01/25/15 0800  Gross per 24 hour  Intake 7527.86 ml  Output   1155 ml  Net 6372.86 ml    PHYSICAL EXAMINATION: General: Looks  critically ill, still on three pressors. abd softer Neuro:  BIS 32 - on nimbex, fent, versed HEENT:  Orally intubated  Cardiovascular:  Loletha Grayer rrr paced on tele  Lungs: basilar rales  Abdomen:  Distended, tympanic hypoactive-->softer  GU: urine less cloudy but still low uop  Musculoskeletal:  Intact  Skin:  Cool and mottled   LABS: PULMONARY  Recent Labs Lab 01/28/2015 1215 01/13/2015 1625 02/11/2015 1830 01/25/15 0310  PHART 7.101* 7.221*  --  7.423  PCO2ART 58.3* 43.9  --  28.0*  PO2ART 199* 60.2*  --  56.4*  HCO3 17.7* 17.3*  --  17.9*  TCO2 17.6 16.4  --  16.7  O2SAT 98.2 88.9 43.6 87.7    CBC  Recent Labs Lab 01/24/2015 1215 01/24/15 0810 01/25/15 0500  HGB 11.3* 10.1* 8.4*  HCT 35.9* 28.8* 25.1*  WBC 14.8* 8.6 8.3  PLT 217 139* 94*    COAGULATION  Recent Labs Lab 01/21/2015 1453  INR 1.53*    CARDIAC   Recent Labs Lab 02/01/2015 0920  TROPONINI 0.03   No results for input(s): PROBNP in the last 168 hours.   CHEMISTRY  Recent Labs Lab 01/24/15 0554 01/24/15 0810 01/24/15 1743 01/24/15 2145 01/25/15 0500  NA 135 134* 135 136 133*  K 4.1 4.0 3.7 3.7 3.7  CL 100* 100* 100* 102 101  CO2 22 22 20* 19* 16*  GLUCOSE 315* 292* 174* 133* 277*  BUN 70* 70* 72* 74* 68*  CREATININE 5.37* 5.37* 5.37* 5.22* 5.04*  CALCIUM 7.3* 7.2* 7.2* 7.1* 6.8*  MG  --  0.9*  --   --  1.2*  PHOS 1.5* 1.6* 3.6 4.0 3.4   Estimated Creatinine Clearance: 15.8 mL/min (by C-G formula based on Cr of  5.04).   LIVER  Recent Labs Lab 02/05/2015 0920  02/04/2015 1453 01/24/15 0554 01/24/15 0810 01/24/15 1743 01/24/15 2145 01/25/15 0500  AST 28  --   --   --   --   --   --   --   ALT 19  --   --   --   --   --   --   --   ALKPHOS 88  --   --   --   --   --   --   --   BILITOT 1.3*  --   --   --   --   --   --   --   PROT 6.9  --   --   --   --   --   --   --   ALBUMIN 3.7  < >  --  2.0* 1.9* 1.9* 1.9* 1.7*  INR  --   --  1.53*  --   --   --   --   --   < > = values in this interval not displayed.   INFECTIOUS  Recent Labs Lab 01/27/2015 1453 01/24/15 0550  01/24/15 1743 01/24/15 2145 01/25/15 0500  LATICACIDVEN  --   --   < > 4.7* 4.9* 5.2*  PROCALCITON 13.23 49.11  --   --   --  36.62  < > = values in this interval not displayed.   ENDOCRINE CBG (last 3)   Recent Labs  01/25/15 0033 01/25/15 0335 01/25/15 0810  GLUCAP 78 34* 77         IMAGING x48h  - image(s) personally visualized  -   highlighted in bold US Renal Port  01/24/2015   CLINICAL DATA:  Acute respiratory distress syndrome with elevated BUN and creatinine.  EXAM: RENAL / URINARY TRACT ULTRASOUND COMPLETE  COMPARISON:  CT scan 01/11/2014  FINDINGS: Right Kidney:  Length: 9.9 cm. Mild renal cortical thinning and increased echogenicity suggesting medical renal disease. No hydronephrosis. There is a small exophytic cystic lesion projecting off the upper pole region of the kidney which measures 1.4 x 1.6 x 1.6 cm.  Left Kidney:  Length: 10.5 cm. Normal renal cortical thickness and echogenicity without focal lesions or hydronephrosis. There is a 8 mm midpole calculus noted.  Bladder:  Decompressed by a Foley catheter.  IMPRESSION: 1. Right renal cortical thinning and increased echogenicity suggesting medical renal disease. The left kidney appears normal. 2. No hydronephrosis. 3. 1.6 cm upper pole right renal cystic lesion is likely a complex cyst. No change since prior CT scan. 4. 8 mm midpole left renal  calculus. 5. Bladder decompressed by Foley  catheter.   Electronically Signed   By: Marijo Sanes M.D.   On: 01/24/2015 10:42   Dg Chest Port 1 View  01/25/2015   CLINICAL DATA:  ARDS.  Shock.  Cardiac arrest.  EXAM: PORTABLE CHEST - 1 VIEW  COMPARISON:  01/19/2015  FINDINGS: Endotracheal tube, central venous catheter and NG tube appear in good position. Pacemaker in place.  Progressive bilateral pulmonary infiltrates with increased consolidation particularly at the left lung base. I suspect the patient has developed small bilateral pleural effusions as well.  The overall heart size and vascularity are normal.  IMPRESSION: Progressive bilateral pulmonary infiltrates. Probable new small effusions.   Electronically Signed   By: Lorriane Shire M.D.   On: 01/25/2015 08:43   Dg Chest Port 1 View  01/22/2015   CLINICAL DATA:  Right IJ line placement  EXAM: PORTABLE CHEST - 1 VIEW  COMPARISON:  Portable chest x-ray of 01/29/2015  FINDINGS: Unfortunately, the defibrillator pad overlies the lower right heart shadow, and the tip of the right IJ central venous line may not be completely visualized. The right IJ central venous line tip is seen to a least the SVC-RA junction. No pneumothorax is seen. There is patchy airspace disease suspicious for pneumonia. Cardiomegaly is stable. Permanent pacemaker remains. The tip of the endotracheal tube is approximately 3.4 cm above the carina.  IMPRESSION: 1. The tip of the right IJ central venous line may be partially obscured by the defibrillator pad, but it appears to extend to at least the SVC -RA junction. No pneumothorax. 2. Patchy airspace disease suspicious for pneumonia.   Electronically Signed   By: Ivar Drape M.D.   On: 02/02/2015 13:54   Dg Chest Portable 1 View  01/19/2015   CLINICAL DATA:  Endotracheal and nasogastric tube placements.  EXAM: PORTABLE CHEST - 1 VIEW  COMPARISON:  01/14/2015 at 1010 hours  FINDINGS: Endotracheal tube has been placed and terminates  approximately 3 cm above the carina. Enteric tube courses into the left upper abdomen with tip not imaged. Cardiac silhouette remains mildly enlarged with a dual lead pacemaker again noted. There is increased pulmonary vascular congestion with mild, patchy opacities in the right greater than left mid and lower lungs. No segmental airspace consolidation, pleural effusion, or pneumothorax is identified.  IMPRESSION: 1. Endotracheal and enteric tubes as above. 2. Increased pulmonary vascular congestion with likely mild edema.   Electronically Signed   By: Logan Bores   On: 01/22/2015 12:49   Dg Chest Portable 1 View  02/02/2015   CLINICAL DATA:  Nausea vomiting and diarrhea for 2 days.  EXAM: PORTABLE CHEST - 1 VIEW  COMPARISON:  10/23/2014  FINDINGS: Left chest wall pacer device is noted with lead in the right atrial appendage and right ventricle. The heart size appears normal. There is no pleural effusion or edema. No airspace consolidation identified. Lung volumes appear low.  IMPRESSION: 1. Low lung volumes.   Electronically Signed   By: Kerby Moors M.D.   On: 01/28/2015 10:39   Dg Abd Portable 1v  01/24/2015   CLINICAL DATA:  Abdominal distention.  EXAM: PORTABLE ABDOMEN - 1 VIEW  COMPARISON:  Abdominal radiographs from 1 day prior.  FINDINGS: An enteric tube is seen in the proximal stomach, with the tip likely in the gastric fundus, although evaluation is limited by motion degradation. There are persistent mildly to moderately dilated small bowel loops in the mid abdomen measuring up to 4.4 cm diameter, improved in the interval, previously  6.0 cm maximum diameter. No evidence of pneumatosis or pneumoperitoneum. Degenerative changes are present throughout the visualized thoracolumbar spine. A catheter tip overlies the midline deep pelvis.  IMPRESSION: Interval decreased caliber of persistently mildly to moderately dilated mid small bowel loops. Findings are nonspecific and represent either improving  obstruction or improving ileus.   Electronically Signed   By: Ilona Sorrel M.D.   On: 01/24/2015 09:38   Dg Abd Portable 2v  02/08/2015   CLINICAL DATA:  Nausea, vomiting.  EXAM: PORTABLE ABDOMEN - 2 VIEW  COMPARISON:  August 10, 2014.  FINDINGS: No definite pneumoperitoneum is noted. There is interval development of severe small bowel dilatation concerning for distal small bowel obstruction. Phlebolith is noted in the pelvis.  IMPRESSION: Interval development of severe small bowel dilatation consistent with distal small bowel obstruction.   Electronically Signed   By: Marijo Conception, M.D.   On: 02/12/2015 10:39        ASSESSMENT / PLAN:  PULMONARY OETT 8/11>>> A: Acute hypoxic and hypercarbic respiratory arrest (P/F ratio <200) -> aspn pna/severe ARDS    - on 40% fio2  P:   ARDS protocol Stop NMB on 8/13 - 48h completed fent and versed gtt; to continue for now F/u Sputum culture   CARDIOVASCULAR CVL right IJ CVL 8/11>>>> A:  Refractory Septic shock/MODS S/p cardiopulmonary PEA arrest (17 minutes to ROSC in spite of provider ACLS) H/o symptomatic bradycardia w/ PPM    -  on three pressors: levo/vaso/epi. CVP at goal. Holding goal MAP.   P:  Volume resuscitation completed; will keep CVP goal 8-12 MAP goal > 65 -On levophed, vasopressin and, epineprhine gtt -->wean epi off first   RENAL  Recent Labs Lab 01/24/15 0554 01/24/15 0810 01/24/15 1743 01/24/15 2145 01/25/15 0500  CREATININE 5.37* 5.37* 5.37* 5.22* 5.04*     A:   Acute renal failure- Lactic acidosis    - worsening lactic acidosis 01/25/2015 and hypomag  Though creat some better and ur op is relatively ok.No hydro renal US 01/24/15    P:   Cont IV hydration w/ goal to keep even volume status  Renal dose meds Cont strict I&O  GASTROINTESTINAL A:   N/V and abd pain SBO-->abd softer . AXR 01/24/15 improved   - nil acute  P:   NPO Bowel rest NGT to LIWS PPI  Will hold off on CT abd/pelvis  for now   HEMATOLOGIC  Recent Labs Lab 01/28/2015 1215 01/24/15 0810 01/25/15 0500  HGB 11.3* 10.1* 8.4*  HCT 35.9* 28.8* 25.1*  WBC 14.8* 8.6 8.3  PLT 217 139* 94*    A:   Anemia of critical illness Mild coagulopathy in setting of sepsis Worsening thrombocytopenia  Due to sesepsis (low prob HIT)  P:  Blair heparin AND SCDs as he is on NMB Trend CBC Transfuse for Hgb < 7   INFECTIOUS   A:   Septic shock likely [place] source +/- bacterial translocation from gut  Aspiration PNA  P:   BCx2 8/11>>> UC 8/11>>> Sputum: resp culture 8/11: few gpc pairs/chains>>> Abx: vanc 8/11>>> Zosyn 8/11>>>  ENDOCRINE A:   Hyperglycemia   P:   ssi protocol   NEUROLOGIC A:   Acute encephalopathy in setting of sepsis, acidosis and s/p cardiac arrest: Concerned about possible hypoxic injury    - currently paraluzed and sedated with BIS 32  P:   RASS goal: -5 Dc NMB protocol w/ BIS monitoring  Fentanyl and versed gtt   FAMILY  -  Updates: no family at bedside 01/25/2015  - Inter-disciplinary family meet or Palliative Care meeting due by: 8/16   CODE    Code Status Orders        Start     Ordered   01/21/2015 1611  Do not attempt resuscitation (DNR)   Continuous    Question:  Do not initiate new interventions  Answer:  Yes   01/21/2015 1610        TODAY'S SUMMARY:  refractory shock MODS.   ARDS and creat  is better and will dc nimbex but shock same with 3 pressor need and lactic acidosis worse. Will check repeat lactate and trop at 3pm. Will dc nimbex.   STAFF NOTE:  The patient is critically ill with multiple organ systems failure and requires high complexity decision making for assessment and support, frequent evaluation and titration of therapies, application of advanced monitoring technologies and extensive interpretation of multiple databases.   Critical Care Time devoted to patient care services described in this note is  30  Minutes. This time reflects time of care of  this signee Dr Brand Males. This critical care time does not reflect procedure time, or teaching time or supervisory time of PA/NP/Med student/Med Resident etc but could involve care discussion time    Dr. Brand Males, M.D., [employer].C.P Pulmonary and Critical Care Medicine Staff Physician [facility] Pager: [phone], If no answer or between  15:00h - 7:00h: call [phone]  01/25/2015 9:55 AM

## 2015-01-25 NOTE — Progress Notes (Signed)
Hypoglycemic Event  CBG: 34  Treatment: D50 IV 50 mL x 2  Symptoms: None  Follow-up CBG: Time: 0405 CBG Result: 50 Follow-up CBG: Time: 0430   CBG Result: 108  Possible Reasons for Event: Unknown  Comments/MD notified: Dr. Vassie Loll notified. Orders received.    Shiflett, Angelina Sheriff

## 2015-01-25 NOTE — Progress Notes (Signed)
Hypoglycemic Event  CBG: 43  Treatment:  1 amp D50  Symptoms:  Intubated and sedated  Follow-up CBG: Time:1607 CBG Result:  59  Possible Reasons for Event: Unstable, critically ill patient.  Comments/MD notified: Dr. Bettina Gavia, Berlinda Last  Remember to initiate Hypoglycemia Order Set & completeAdult (Non-Pregnant) Hypoglycemia Protocol Treatment Guidelines  1.  RN shall initiate Hypoglycemia Protocol emergency measures immediately when:            w Routine or STAT CBG and/or a lab glucose indicates hypoglycemia (CBG < 70 mg/dl)  2.  Treat the patient according to ability to take PO's and severity of hypoglycemia.   3.  If patient is on GlucoStabilizer, follow directions provided by the [facility] for hypoglycemic events.  4.  If patient on insulin pump, follow Hypoglycemia Protocol.  If patient requires more than one treatment have patient place pump in SUSPEND and notify MD.  DO NOT leave pump in SUSPEND for greater than 30 minutes unless ordered by MD.  A.  Treatment for Mild or Moderate-Patient cooperative and able to swallow    1.  Patient taking PO's and can cooperate   a.  Give one of the following 15 gram CHO options:                           w 1 tube oral dextrose gel                           w 3-4 Glucose tablets                           w 4 oz. Juice                           w 4 oz. regular soda                                    ESRD patients:  clear, regular soda                           w 8 oz. skim milk    b.  Recheck CBG in 15 minutes after treatment                            w If CBG < 70 mg/dl, repeat treatment and recheck until hypoglycemia is resolved                            w If CBG > 70 mg/dl and next meal is more than 1 hour away, give additional 15 grams CHO   2.  Patient NPO-Patient cooperative and no altered mental status    a.  Give 25 ml of D50 IV.   b.  Recheck CBG in 15 minutes after treatment.                              w If CBG is less than 70 mg/dl, repeat treatment and recheck until hypoglycemia is resolved.   c.  Notify MD for further orders.  SPECIAL CONSIDERATIONS:    a.  If no IV access,                              w Start IV of D5W at [facility]                             w Give 25 ml of D50 IV.    b.  If unable to gain IV access                             w Give Glucagon IM:    i.  1 mg if patient weighs more than 45.5 kg    ii.  0.5 mg if patient weighs less than 45.5 kg   c.  Notify MD for further orders  B.  Treatment for Severe-- Patient unconscious or unable to take PO's safely    1.  Position patient on side   2.  Give 50 ml D50 IV   3.  Recheck CBG in 15 minutes.                    w If CBG is less than 70 mg/dl, repeat treatment and recheck until hypoglycemia is resolved.   4.  Notify MD for further orders.    SPECIAL CONSIDERATIONS:    a.  If no IV access                              w Give Glucagon IM                                        i.  1 mg if patient weighs more than 45.5 kg                                       ii.  0.5 mg if patient weighs less than 45.5 kg                              w Start IV of D5W at 50 ml/hr and give 50 ml D50 IV   b.  If no IV access and active seizure                               w Call Rapid Response   c.  If unable to gain IV access, give Glucagon IM:                              w 1 mg if patient weighs more than 45.5 kg                              w 0.5 mg if patient weighs less than 45.5 kg   d.  Notify MD for further orders.  C.  Complete smart text progress note to document intervention and follow-up CBG   1.  In  [facility] patient chart, click on Notes (left side of screen)   2.  Create Progress Note   3.  Click on [employer].  In the Match box type "hypo" and enter    4.  Double click on [facility] IP HYPOGLYCEMIC EVENT and enter data   5.  MD must be notified if patient is NPO or experienced severe  hypoglycemia

## 2015-01-25 NOTE — Progress Notes (Signed)
Rt had to increase pt FIO2 and PEEP to 70%/ 10 due to pt sats in the low 80,s.

## 2015-01-25 NOTE — Progress Notes (Signed)
Hypoglycemic Event  CBG: 59  Treatment: 1 amp D50  Symptoms: Intubated and sedated, receiving D10 drip at 75 ml an hour.  Follow-up CBG: Time:1632 CBG Result:  195  Possible Reasons for Event: critically ill and unstable  Comments/MD notified:  Dr. Bettina Gavia, Berlinda Last  Remember to initiate Hypoglycemia Order Set & complete

## 2015-01-25 NOTE — Progress Notes (Addendum)
Hypoglycemic Event  CBG: 54   Treatment: D50 IV 25 mL  Symptoms: None  Follow-up CBG: Time:1230 CBG Result: 78  Possible Reasons for Event: Inadequate meal intake  Comments/MD notified: e-link nurse notified will pass on to MD, orders for D10 received    Shiflett, Angelina Sheriff

## 2015-01-25 NOTE — Progress Notes (Signed)
CRITICAL VALUE ALERT  Critical value received:  Lactic 5.2  Date of notification:  01/25/15  Time of notification:  0620  Critical value read back:Yes.    Nurse who received alert:  Mike Craze, RN  MD notified (1st page):  Dr. Vassie Loll  Time of first page:  E-link called [phone]  MD notified (2nd page):  Time of second page:  Responding MD:  Dr. Vassie Loll  Time MD responded:  [phone]

## 2015-01-26 ENCOUNTER — Inpatient Hospital Stay (HOSPITAL_COMMUNITY): Payer: Medicare Other

## 2015-01-26 LAB — CBC WITH DIFFERENTIAL/PLATELET
Basophils Absolute: 0 10*3/uL (ref 0.0–0.1)
Basophils Relative: 0 % (ref 0–1)
EOS PCT: 3 % (ref 0–5)
Eosinophils Absolute: 0.4 10*3/uL (ref 0.0–0.7)
HEMATOCRIT: 23.6 % — AB (ref 39.0–52.0)
Hemoglobin: 8.1 g/dL — ABNORMAL LOW (ref 13.0–17.0)
Lymphocytes Relative: 5 % — ABNORMAL LOW (ref 12–46)
Lymphs Abs: 0.6 10*3/uL — ABNORMAL LOW (ref 0.7–4.0)
MCH: 29.2 pg (ref 26.0–34.0)
MCHC: 34.3 g/dL (ref 30.0–36.0)
MCV: 85.2 fL (ref 78.0–100.0)
MONO ABS: 0.7 10*3/uL (ref 0.1–1.0)
MONOS PCT: 6 % (ref 3–12)
NEUTROS ABS: 10.6 10*3/uL — AB (ref 1.7–7.7)
Neutrophils Relative %: 86 % — ABNORMAL HIGH (ref 43–77)
Platelets: 88 10*3/uL — ABNORMAL LOW (ref 150–400)
RBC: 2.77 MIL/uL — AB (ref 4.22–5.81)
RDW: 14.2 % (ref 11.5–15.5)
WBC: 12.3 10*3/uL — AB (ref 4.0–10.5)

## 2015-01-26 LAB — RENAL FUNCTION PANEL
Albumin: 1.6 g/dL — ABNORMAL LOW (ref 3.5–5.0)
Anion gap: 12 (ref 5–15)
BUN: 65 mg/dL — AB (ref 6–20)
CHLORIDE: 101 mmol/L (ref 101–111)
CO2: 18 mmol/L — ABNORMAL LOW (ref 22–32)
CREATININE: 4.64 mg/dL — AB (ref 0.61–1.24)
Calcium: 7.2 mg/dL — ABNORMAL LOW (ref 8.9–10.3)
GFR calc Af Amer: 13 mL/min — ABNORMAL LOW (ref >60)
GFR calc non Af Amer: 11 mL/min — ABNORMAL LOW (ref >60)
GLUCOSE: 183 mg/dL — AB (ref 65–99)
POTASSIUM: 3.4 mmol/L — AB (ref 3.5–5.1)
Phosphorus: 2.9 mg/dL (ref 2.5–4.6)
SODIUM: 131 mmol/L — AB (ref 135–145)

## 2015-01-26 LAB — GLUCOSE, CAPILLARY
GLUCOSE-CAPILLARY: 50 mg/dL — AB (ref 65–99)
GLUCOSE-CAPILLARY: 147 mg/dL — AB (ref 65–99)
GLUCOSE-CAPILLARY: 112 mg/dL — AB (ref 65–99)
GLUCOSE-CAPILLARY: 118 mg/dL — AB (ref 65–99)
Glucose-Capillary: 108 mg/dL — ABNORMAL HIGH (ref 65–99)
Glucose-Capillary: 92 mg/dL (ref 65–99)
Glucose-Capillary: 164 mg/dL — ABNORMAL HIGH (ref 65–99)

## 2015-01-26 LAB — MAGNESIUM: MAGNESIUM: 1.5 mg/dL — AB (ref 1.7–2.4)

## 2015-01-26 LAB — CULTURE, RESPIRATORY W GRAM STAIN

## 2015-01-26 LAB — C DIFFICILE QUICK SCREEN W PCR REFLEX
C Diff antigen: NEGATIVE
C Diff interpretation: NEGATIVE
C Diff toxin: NEGATIVE

## 2015-01-26 LAB — TROPONIN I: TROPONIN I: 0.15 ng/mL — AB (ref <0.031)

## 2015-01-26 LAB — LACTIC ACID, PLASMA: LACTIC ACID, VENOUS: 2.9 mmol/L — AB (ref 0.5–2.0)

## 2015-01-26 LAB — CULTURE, RESPIRATORY

## 2015-01-26 MED ORDER — MAGNESIUM SULFATE IN D5W 10-5 MG/ML-% IV SOLN
1.0000 g | Freq: Once | INTRAVENOUS | Status: AC
  Administered 2015-01-26: 1 g via INTRAVENOUS
  Filled 2015-01-26: qty 100

## 2015-01-26 NOTE — Progress Notes (Signed)
Patient continues to produce dark, watery stool.  Flexiseal placed.  Notified Dr. Marchelle Gearing.  Sample sent for cdiff rule out.

## 2015-01-26 NOTE — Progress Notes (Signed)
Patient ID: Cody Vincent, male   DOB: 1940/12/04, 74 y.o.   MRN: [id number]  General Surgery - [facility]  HD#: 2  Subjective: Patient in ICU, intubated, sedated.  Patient DNR with no escalation per family.  Pressors weaning.  Objective: Vital signs in last 24 hours: Temp:  [99.1 F (37.3 C)-100.9 F (38.3 C)] 99.7 F (37.6 C) (08/14 0700) Pulse Rate:  [60-68] 66 (08/14 0600) Resp:  [28-35] 32 (08/14 0600) BP: (61-181)/(14-66) 61/25 mmHg (08/14 0610) SpO2:  [80 %-100 %] 100 % (08/14 0600) Arterial Line BP: (58-188)/(32-78) 125/52 mmHg (08/14 0739) FiO2 (%):  [40 %-70 %] 50 % (08/14 0600) Weight:  [115.7 kg (255 lb 1.2 oz)] 115.7 kg (255 lb 1.2 oz) (08/14 0400) Last BM Date: February 13, 2015  Intake/Output from previous day: 08/13 0701 - 08/14 0700 In: 5554.6 [I.V.:5154.6; NG/GT:100; IV Piggyback:300] Out: 1880 [Urine:1405; Emesis/NG output:475] Intake/Output this shift: Total I/O In: -  Out: 125 [Urine:125]  Physical Exam: HEENT - sclerae clear, mucous membranes moist Neck - soft, right IJ line Cor - RRR Abdomen - soft, no mass; no surgical incisions; NG with small bilious  Lab Results:   Recent Labs  01/25/15 0500 01/26/15 0510  WBC 8.3 12.3*  HGB 8.4* 8.1*  HCT 25.1* 23.6*  PLT 94* 88*   BMET  Recent Labs  01/25/15 0500 01/26/15 0510  NA 133* 131*  K 3.7 3.4*  CL 101 101  CO2 16* 18*  GLUCOSE 277* 183*  BUN 68* 65*  CREATININE 5.04* 4.64*  CALCIUM 6.8* 7.2*   PT/INR  Recent Labs  2015/02/13 1453  LABPROT 18.5*  INR 1.53*   Comprehensive Metabolic Panel:    Component Value Date/Time   NA 131* 01/26/2015 0510   NA 133* 01/25/2015 0500   K 3.4* 01/26/2015 0510   K 3.7 01/25/2015 0500   CL 101 01/26/2015 0510   CL 101 01/25/2015 0500   CO2 18* 01/26/2015 0510   CO2 16* 01/25/2015 0500   BUN 65* 01/26/2015 0510   BUN 68* 01/25/2015 0500   CREATININE 4.64* 01/26/2015 0510   CREATININE 5.04* 01/25/2015 0500   CREATININE  1.16 10/11/2013 1546   CREATININE 1.27 03/20/2012 1553   GLUCOSE 183* 01/26/2015 0510   GLUCOSE 277* 01/25/2015 0500   CALCIUM 7.2* 01/26/2015 0510   CALCIUM 6.8* 01/25/2015 0500   AST 28 2015/02/13 0920   AST 23 09/08/2014 1459   ALT 19 13-Feb-2015 0920   ALT 15 09/08/2014 1459   ALKPHOS 88 02-13-15 0920   ALKPHOS 100 09/08/2014 1459   BILITOT 1.3* February 13, 2015 0920   BILITOT 0.7 09/08/2014 1459   PROT 6.9 February 13, 2015 0920   PROT 5.8* 09/08/2014 1459   ALBUMIN 1.6* 01/26/2015 0510   ALBUMIN 1.7* 01/25/2015 0500    Studies/Results: US Renal Port  01/24/2015   CLINICAL DATA:  Acute respiratory distress syndrome with elevated BUN and creatinine.  EXAM: RENAL / URINARY TRACT ULTRASOUND COMPLETE  COMPARISON:  CT scan 01/11/2014  FINDINGS: Right Kidney:  Length: 9.9 cm. Mild renal cortical thinning and increased echogenicity suggesting medical renal disease. No hydronephrosis. There is a small exophytic cystic lesion projecting off the upper pole region of the kidney which measures 1.4 x 1.6 x 1.6 cm.  Left Kidney:  Length: 10.5 cm. Normal renal cortical thickness and echogenicity without focal lesions or hydronephrosis. There is a 8 mm midpole calculus noted.  Bladder:  Decompressed by a Foley catheter.  IMPRESSION: 1. Right renal cortical thinning and increased  echogenicity suggesting medical renal disease. The left kidney appears normal. 2. No hydronephrosis. 3. 1.6 cm upper pole right renal cystic lesion is likely a complex cyst. No change since prior CT scan. 4. 8 mm midpole left renal calculus. 5. Bladder decompressed by Foley catheter.   Electronically Signed   By: Rudie Meyer M.D.   On: 01/24/2015 10:42   Dg Chest Port 1 View  01/25/2015   CLINICAL DATA:  ARDS.  Shock.  Cardiac arrest.  EXAM: PORTABLE CHEST - 1 VIEW  COMPARISON:  01/17/2015  FINDINGS: Endotracheal tube, central venous catheter and NG tube appear in good position. Pacemaker in place.  Progressive bilateral pulmonary  infiltrates with increased consolidation particularly at the left lung base. I suspect the patient has developed small bilateral pleural effusions as well.  The overall heart size and vascularity are normal.  IMPRESSION: Progressive bilateral pulmonary infiltrates. Probable new small effusions.   Electronically Signed   By: Francene Boyers M.D.   On: 01/25/2015 08:43   Dg Abd Portable 1v  01/24/2015   CLINICAL DATA:  Abdominal distention.  EXAM: PORTABLE ABDOMEN - 1 VIEW  COMPARISON:  Abdominal radiographs from 1 day prior.  FINDINGS: An enteric tube is seen in the proximal stomach, with the tip likely in the gastric fundus, although evaluation is limited by motion degradation. There are persistent mildly to moderately dilated small bowel loops in the mid abdomen measuring up to 4.4 cm diameter, improved in the interval, previously 6.0 cm maximum diameter. No evidence of pneumatosis or pneumoperitoneum. Degenerative changes are present throughout the visualized thoracolumbar spine. A catheter tip overlies the midline deep pelvis.  IMPRESSION: Interval decreased caliber of persistently mildly to moderately dilated mid small bowel loops. Findings are nonspecific and represent either improving obstruction or improving ileus.   Electronically Signed   By: Delbert Phenix M.D.   On: 01/24/2015 09:38    Anti-infectives: Anti-infectives    Start     Dose/Rate Route Frequency Ordered Stop   01/24/15 1100  piperacillin-tazobactam (ZOSYN) IVPB 2.25 g     2.25 g 100 mL/hr over 30 Minutes Intravenous Every 6 hours 01/24/15 0802     01/14/2015 1430  vancomycin (VANCOCIN) IVPB 1000 mg/200 mL premix  Status:  Discontinued     1,000 mg 200 mL/hr over 60 Minutes Intravenous Every 48 hours 02/11/2015 1415 01/24/15 0802   01/17/2015 1430  piperacillin-tazobactam (ZOSYN) IVPB 2.25 g  Status:  Discontinued     2.25 g 100 mL/hr over 30 Minutes Intravenous 3 times per day 01/19/2015 1415 01/24/15 0802   01/28/2015 1400   piperacillin-tazobactam (ZOSYN) IVPB 3.375 g  Status:  Discontinued     3.375 g 100 mL/hr over 30 Minutes Intravenous  Once 01/29/2015 1349 01/25/2015 1415   02/04/2015 1400  vancomycin (VANCOCIN) IVPB 1000 mg/200 mL premix  Status:  Discontinued     1,000 mg 200 mL/hr over 60 Minutes Intravenous  Once 02/08/2015 1349 01/18/2015 1415   01/13/2015 1015  vancomycin (VANCOCIN) IVPB 1000 mg/200 mL premix     1,000 mg 200 mL/hr over 60 Minutes Intravenous  Once 02/10/2015 1003 02/02/2015 1236   01/26/2015 1015  piperacillin-tazobactam (ZOSYN) IVPB 4.5 g  Status:  Discontinued     4.5 g 200 mL/hr over 30 Minutes Intravenous  Once 01/17/2015 1003 01/22/2015 1005   01/24/2015 1015  piperacillin-tazobactam (ZOSYN) IVPB 3.375 g     3.375 g 100 mL/hr over 30 Minutes Intravenous STAT 02/10/2015 1006 01/16/2015 1236  Assessment & Plans: 1. Septic shock  IV Vanco, Zosyn empirically  ?source - consider intra-abdominal source - CT scan may be helpful if patient stabilizes enough to undergo scan  Continue NPO, NG 2.  Respiratory failure / aspiration event 3.  Cardiac arrest / resuscitation  ? Neurologic status - potential for recovery  Will follow PRN for now.  Cont NG decompression until bowel function.  If he starts to have bowel function, can start tube feeds.  No role for surgical intervention at this point.       Leonila Speranza C. 01/26/2015

## 2015-01-26 NOTE — Progress Notes (Signed)
PULMONARY / CRITICAL CARE MEDICINE   Name: Cody Vincent MRN: [id number] DOB: 02/12/1941    ADMISSION DATE:  01/27/2015 CONSULTATION DATE:  8/11  REFERRING MD : EDP   CHIEF COMPLAINT:  S/p cardiac arrest, SBO, septic shock   INITIAL PRESENTATION:  74 year old male w/ h/o rather functional dementia. Resides at [facility]. Presented to the ER on 8/11 w/ N/V/D x 2 days. Became progressively hypotensive so EMS called. On ER arrival SBP was in 80s, creatinine was 5.53 and had lactic acid of 3.08. He was started on abx, given IV hydration and his lactic acid initially cleared. He was undergoing CT scan evaluation to work up SBO. While flat in CT scan he vomited, then developed hypoxia and subsequent cardio-pulmonary arrest. His time to ROSC was 17 minutes. He was intubated. Remained hypotensive. PCCM asked to admit w/ working dx of SBO, aspiration PNA and shock.   STUDIES:  Renal US 8/12>>>  SIGNIFICANT EVENTS: 8/11: admitted-->while in CT scan suffered 17 min PEA arrest. Admitted to ICU in MODS & refractory shock requiring 3 pressors. Placed on Nimbex d/t PF ratio <200.  Met w/ daughter Lattie Haw. Discussed poor prognosis, made DNR but cont current level of care w/ no further escalation.  8/12: BP holding on levo/vaso and epi gtts. CVP @ goal. WBC ct trending down, creatinine appeared to leveled off, ordered Renal US.   01/25/2015 : 40% fio2. On nimbex , fent. Versed -> BIS 32. On epi and levophed gtt. Lactic acidosis still persists    SUBJECTIVE/OVERNIGHT/INTERVAL HX 01/26/15:  Off nimbex. On 50% fio2/peep 5. On levophed 101mg and vaso 0.03. Of epi gtt. Lactic acidosis  Improving slowly. Maing urine. Lot of 3rdd spacing Creat better   VITAL SIGNS: Temp:  [99.3 F (37.4 C)-100.9 F (38.3 C)] 99.3 F (37.4 C) (08/14 1200) Pulse Rate:  [60-68] 63 (08/14 1200) Resp:  [28-35] 35 (08/14 1200) BP: (61-181)/(24-66) 158/49 mmHg (08/14 0730) SpO2:  [93 %-100 %] 97 % (08/14 1200) Arterial Line BP:  (72-188)/(39-78) 137/58 mmHg (08/14 1245) FiO2 (%):  [50 %-70 %] 50 % (08/14 1200) Weight:  [115.7 kg (255 lb 1.2 oz)] 115.7 kg (255 lb 1.2 oz) (08/14 0400) HEMODYNAMICS: CVP:  [10 mmHg-20 mmHg] 20 mmHg VENTILATOR SETTINGS: Vent Mode:  [-] PRVC FiO2 (%):  [50 %-70 %] 50 % Set Rate:  [35 bmp] 35 bmp Vt Set:  [550 mL] 550 mL PEEP:  [5 cmH20-10 cmH20] 5 cmH20 Plateau Pressure:  [25 cmH20-30 cmH20] 28 cmH20 INTAKE / OUTPUT:  Intake/Output Summary (Last 24 hours) at 01/26/15 1339 Last data filed at 01/26/15 1322  Gross per 24 hour  Intake 5090.26 ml  Output   2370 ml  Net 2720.26 ml    PHYSICAL EXAMINATION: General: Looks  critically ill, on 2 pressors. abd softer Neuro:  RASS -4 on  fent, versed (Moved Rt arm  And grimaced to pain per RN) HEENT:  Orally intubated  Cardiovascular:  BLoletha Grayerrrr paced on tele  Lungs: basilar rales  Abdomen:  Distended, tympanic hypoactive-->softer  GU: urine less cloudy but still low uop  Musculoskeletal:  Intact  Skin:  Cool and mottled   LABS: PULMONARY  Recent Labs Lab 01/15/2015 1215 01/20/2015 1625 01/26/2015 1830 01/25/15 0310  PHART 7.101* 7.221*  --  7.423  PCO2ART 58.3* 43.9  --  28.0*  PO2ART 199* 60.2*  --  56.4*  HCO3 17.7* 17.3*  --  17.9*  TCO2 17.6 16.4  --  16.7  O2SAT 98.2 88.9  43.6 87.7    CBC  Recent Labs Lab 01/24/15 0810 01/25/15 0500 01/26/15 0510  HGB 10.1* 8.4* 8.1*  HCT 28.8* 25.1* 23.6*  WBC 8.6 8.3 12.3*  PLT 139* 94* 88*    COAGULATION  Recent Labs Lab 01/22/2015 1453  INR 1.53*    CARDIAC    Recent Labs Lab 01/16/2015 0920 01/25/15 1001 01/26/15 0510  TROPONINI 0.03 0.17* 0.15*   No results for input(s): PROBNP in the last 168 hours.   CHEMISTRY  Recent Labs Lab 01/24/15 0810 01/24/15 1743 01/24/15 2145 01/25/15 0500 01/26/15 0510  NA 134* 135 136 133* 131*  K 4.0 3.7 3.7 3.7 3.4*  CL 100* 100* 102 101 101  CO2 22 20* 19* 16* 18*  GLUCOSE 292* 174* 133* 277* 183*  BUN 70* 72*  74* 68* 65*  CREATININE 5.37* 5.37* 5.22* 5.04* 4.64*  CALCIUM 7.2* 7.2* 7.1* 6.8* 7.2*  MG 0.9*  --   --  1.2* 1.5*  PHOS 1.6* 3.6 4.0 3.4 2.9   Estimated Creatinine Clearance: 17.5 mL/min (by C-G formula based on Cr of 4.64).   LIVER  Recent Labs Lab 01/16/2015 0920  02/01/2015 1453  01/24/15 0810 01/24/15 1743 01/24/15 2145 01/25/15 0500 01/26/15 0510  AST 28  --   --   --   --   --   --   --   --   ALT 19  --   --   --   --   --   --   --   --   ALKPHOS 88  --   --   --   --   --   --   --   --   BILITOT 1.3*  --   --   --   --   --   --   --   --   PROT 6.9  --   --   --   --   --   --   --   --   ALBUMIN 3.7  < >  --   < > 1.9* 1.9* 1.9* 1.7* 1.6*  INR  --   --  1.53*  --   --   --   --   --   --   < > = values in this interval not displayed.   INFECTIOUS  Recent Labs Lab 02/03/2015 1453 01/24/15 0550  01/25/15 0500 01/25/15 1001 01/26/15 0510  LATICACIDVEN  --   --   < > 5.2* 3.3* 2.9*  PROCALCITON 13.23 49.11  --  36.62  --   --   < > = values in this interval not displayed.   ENDOCRINE CBG (last 3)   Recent Labs  01/26/15 0348 01/26/15 0739 01/26/15 1204  GLUCAP 147* 112* 118*         IMAGING x48h  - image(s) personally visualized  -   highlighted in bold Dg Chest Port 1 View  01/26/2015   CLINICAL DATA:  Follow-up endotracheal  EXAM: PORTABLE CHEST - 1 VIEW  COMPARISON:  01/25/2015  FINDINGS: Cardiomediastinal silhouette is stable. Stable endotracheal and NG tube position. Dual lead cardiac pacemaker is unchanged in position. Right IJ central line is unchanged in position. Persistent bilateral lower lobe patchy infiltrates.  IMPRESSION: No significant change. Stable support apparatus. Dual lead cardiac pacemaker is unchanged in position. Persistent bilateral lower lobe patchy infiltrates.   Electronically Signed   By: Lahoma Crocker M.D.   On: 01/26/2015 09:22  Dg Chest Port 1 View  01/25/2015   CLINICAL DATA:  ARDS.  Shock.  Cardiac arrest.  EXAM:  PORTABLE CHEST - 1 VIEW  COMPARISON:  02/11/2015  FINDINGS: Endotracheal tube, central venous catheter and NG tube appear in good position. Pacemaker in place.  Progressive bilateral pulmonary infiltrates with increased consolidation particularly at the left lung base. I suspect the patient has developed small bilateral pleural effusions as well.  The overall heart size and vascularity are normal.  IMPRESSION: Progressive bilateral pulmonary infiltrates. Probable new small effusions.   Electronically Signed   By: Lorriane Shire M.D.   On: 01/25/2015 08:43        ASSESSMENT / PLAN:  PULMONARY OETT 8/11>>> A: Acute hypoxic and hypercarbic respiratory arrest (P/F ratio <200) -> aspn pna/severe ARDS    - on 50% fio2, peep 5. Off 48h  nimbex on 813/16.   P:   ARDS protocol to continue fent and versed gtt; to continue for now F/u Sputum culture   CARDIOVASCULAR CVL right IJ CVL 8/11>>>> A:  Refractory Septic shock/MODS S/p cardiopulmonary PEA arrest (17 minutes to ROSC in spite of provider ACLS) H/o symptomatic bradycardia w/ PPM    -  on 2 pressors: levo/vaso/ CVP 20. Holding goal MAP. Improved. 3rd spacing +  P:  Volume resuscitation completed; will keep CVP goal 8-12 - start diureses 01/27/15 Reduce fluids to kvo 01/26/2015 MAP goal > 65 -On levophed, vasopressin    RENAL   A:   Acute renal failure- Lactic acidosis    - .No hydro renal US 01/24/15  - both improving rapidly   P:   KVO fluids Start lasix 01/27/15 Renal dose meds Cont strict I&O  GASTROINTESTINAL A:   N/V and abd pain SBO-->abd softer . AXR 01/24/15 improved   - nil acute. OG returns is normal green at 300cc/shift  P:   NPO AXR 01/27/15 and if ok d/w CCS and start tube feeds NGT to LIWS PPI  Will hold off on CT abd/pelvis for now   HEMATOLOGIC A:   Anemia of critical illness Mild coagulopathy in setting of sepsis Worsening thrombocytopenia  Due to sesepsis (low prob HIT)  P:    heparin AND SCDs as he is on NMB Trend CBC Transfuse for Hgb < 7   INFECTIOUS   A:   Septic shock likely UT source +/- bacterial translocation from gut  Aspiration PNA  P:   BCx2 8/11>>> UC 8/11>>> Sputum: resp culture 8/11: few gpc pairs/chains>>> Abx: vanc 8/11>>> Zosyn 8/11>>>  ENDOCRINE A:   Hyperglycemia   P:   ssi protocol   NEUROLOGIC A:   Acute encephalopathy in setting of sepsis, acidosis and s/p cardiac arrest: Concerned about possible hypoxic injury    -having some pain response and movement on ltd wua (full made tough by ARDS deep sedation need) P:   RASS goal: -5 Fentanyl and versed gtt   FAMILY  - Updates: no family at bedside 01/26/2015  - Inter-disciplinary family meet or Palliative Care meeting due by: 8/16   CODE    Code Status Orders        Start     Ordered   02/02/2015 1611  Do not attempt resuscitation (DNR)   Continuous    Question:  Do not initiate new interventions  Answer:  Yes   01/16/2015 1610        TODAY'S SUMMARY:  refractory shock MODS.   ARDS, shock  and creat  All bettter. His prognosis has  improved though seems LTAC/trach candidate. Need tos tart po feeds asap   STAFF NOTE:  The patient is critically ill with multiple organ systems failure and requires high complexity decision making for assessment and support, frequent evaluation and titration of therapies, application of advanced monitoring technologies and extensive interpretation of multiple databases.   Critical Care Time devoted to patient care services described in this note is  30  Minutes. This time reflects time of care of this signee Dr Brand Males. This critical care time does not reflect procedure time, or teaching time or supervisory time of PA/NP/Med student/Med Resident etc but could involve care discussion time    Dr. Brand Males, M.D., [facility].C.P Pulmonary and Critical Care Medicine Staff Physician [place] [facility]  [facility] Pager: [phone], If no answer or between  15:00h - 7:00h: call [phone]  01/26/2015 1:39 PM

## 2015-01-26 NOTE — Plan of Care (Signed)
Problem: Phase I Progression Outcomes Goal: Patient tolerating weaning plan Outcome: Not Applicable Date Met:  37/95/58 Not ready yet, ARDs protocol and vasopressors.

## 2015-01-27 ENCOUNTER — Inpatient Hospital Stay (HOSPITAL_COMMUNITY): Payer: Medicare Other

## 2015-01-27 LAB — RENAL FUNCTION PANEL
ANION GAP: 9 (ref 5–15)
Albumin: 1.5 g/dL — ABNORMAL LOW (ref 3.5–5.0)
BUN: 62 mg/dL — ABNORMAL HIGH (ref 6–20)
CHLORIDE: 99 mmol/L — AB (ref 101–111)
CO2: 20 mmol/L — ABNORMAL LOW (ref 22–32)
Calcium: 7.5 mg/dL — ABNORMAL LOW (ref 8.9–10.3)
Creatinine, Ser: 4.13 mg/dL — ABNORMAL HIGH (ref 0.61–1.24)
GFR, EST AFRICAN AMERICAN: 15 mL/min — AB (ref >60)
GFR, EST NON AFRICAN AMERICAN: 13 mL/min — AB (ref >60)
Glucose, Bld: 198 mg/dL — ABNORMAL HIGH (ref 65–99)
POTASSIUM: 3.1 mmol/L — AB (ref 3.5–5.1)
Phosphorus: 2.7 mg/dL (ref 2.5–4.6)
Sodium: 128 mmol/L — ABNORMAL LOW (ref 135–145)

## 2015-01-27 LAB — CBC WITH DIFFERENTIAL/PLATELET
Basophils Absolute: 0 10*3/uL (ref 0.0–0.1)
Basophils Relative: 0 % (ref 0–1)
Eosinophils Absolute: 0.3 10*3/uL (ref 0.0–0.7)
Eosinophils Relative: 3 % (ref 0–5)
HCT: 23.5 % — ABNORMAL LOW (ref 39.0–52.0)
HEMOGLOBIN: 8 g/dL — AB (ref 13.0–17.0)
LYMPHS ABS: 0.8 10*3/uL (ref 0.7–4.0)
LYMPHS PCT: 6 % — AB (ref 12–46)
MCH: 28.8 pg (ref 26.0–34.0)
MCHC: 34 g/dL (ref 30.0–36.0)
MCV: 84.5 fL (ref 78.0–100.0)
MONOS PCT: 7 % (ref 3–12)
Monocytes Absolute: 0.9 10*3/uL (ref 0.1–1.0)
NEUTROS PCT: 84 % — AB (ref 43–77)
Neutro Abs: 10.6 10*3/uL — ABNORMAL HIGH (ref 1.7–7.7)
Platelets: 74 10*3/uL — ABNORMAL LOW (ref 150–400)
RBC: 2.78 MIL/uL — AB (ref 4.22–5.81)
RDW: 14.2 % (ref 11.5–15.5)
WBC: 12.6 10*3/uL — AB (ref 4.0–10.5)

## 2015-01-27 LAB — BLOOD GAS, ARTERIAL
ACID-BASE DEFICIT: 4.8 mmol/L — AB (ref 0.0–2.0)
BICARBONATE: 18.7 meq/L — AB (ref 20.0–24.0)
DRAWN BY: 232811
FIO2: 0.4
O2 Saturation: 93.1 %
PEEP/CPAP: 5 cmH2O
PH ART: 7.398 (ref 7.350–7.450)
Patient temperature: 37.7
RATE: 35 resp/min
TCO2: 17.7 mmol/L (ref 0–100)
VT: 550 mL
pCO2 arterial: 31.2 mmHg — ABNORMAL LOW (ref 35.0–45.0)
pO2, Arterial: 68.6 mmHg — ABNORMAL LOW (ref 80.0–100.0)

## 2015-01-27 LAB — GLUCOSE, CAPILLARY
GLUCOSE-CAPILLARY: 121 mg/dL — AB (ref 65–99)
GLUCOSE-CAPILLARY: 140 mg/dL — AB (ref 65–99)
GLUCOSE-CAPILLARY: 149 mg/dL — AB (ref 65–99)
GLUCOSE-CAPILLARY: 169 mg/dL — AB (ref 65–99)
GLUCOSE-CAPILLARY: 199 mg/dL — AB (ref 65–99)
Glucose-Capillary: 149 mg/dL — ABNORMAL HIGH (ref 65–99)
Glucose-Capillary: 157 mg/dL — ABNORMAL HIGH (ref 65–99)

## 2015-01-27 LAB — LACTIC ACID, PLASMA: LACTIC ACID, VENOUS: 2 mmol/L (ref 0.5–2.0)

## 2015-01-27 LAB — MAGNESIUM: Magnesium: 1.6 mg/dL — ABNORMAL LOW (ref 1.7–2.4)

## 2015-01-27 MED ORDER — VITAL HIGH PROTEIN PO LIQD: 1000.0000 mL | ORAL | Status: DC

## 2015-01-27 MED ORDER — INSULIN ASPART 100 UNIT/ML ~~LOC~~ SOLN
0.0000 [IU] | SUBCUTANEOUS | Status: DC
  Administered 2015-01-27 – 2015-01-28 (×7): 2 [IU] via SUBCUTANEOUS
  Administered 2015-01-28: 1 [IU] via SUBCUTANEOUS
  Administered 2015-01-29: 2 [IU] via SUBCUTANEOUS
  Administered 2015-01-29: 1 [IU] via SUBCUTANEOUS
  Administered 2015-01-29: 2 [IU] via SUBCUTANEOUS

## 2015-01-27 MED ORDER — VITAL AF 1.2 CAL PO LIQD
1000.0000 mL | ORAL | Status: DC
  Administered 2015-01-27: 1000 mL
  Filled 2015-01-27 (×2): qty 1000

## 2015-01-27 MED ORDER — MAGNESIUM SULFATE IN D5W 10-5 MG/ML-% IV SOLN
1.0000 g | Freq: Once | INTRAVENOUS | Status: AC
  Administered 2015-01-27: 1 g via INTRAVENOUS
  Filled 2015-01-27: qty 100

## 2015-01-27 MED ORDER — POTASSIUM CHLORIDE 10 MEQ/50ML IV SOLN
10.0000 meq | INTRAVENOUS | Status: AC
  Administered 2015-01-27 (×4): 10 meq via INTRAVENOUS
  Filled 2015-01-27 (×4): qty 50

## 2015-01-27 MED ORDER — CHLORHEXIDINE GLUCONATE 0.12 % MT SOLN
OROMUCOSAL | Status: AC
  Filled 2015-01-27: qty 15

## 2015-01-27 NOTE — Care Management Important Message (Signed)
Important Message  Patient Details  Name: Cody Vincent MRN: [id number] Date of Birth: 05-14-1941   Medicare Important Message Given:  Yes-second notification given    Renie Ora 01/27/2015, 4:49 PMImportant Message  Patient Details  Name: Cody Vincent MRN: [id number] Date of Birth: May 17, 1941   Medicare Important Message Given:  Yes-second notification given    Renie Ora 01/27/2015, 4:49 PM

## 2015-01-27 NOTE — Progress Notes (Signed)
Nutrition Follow-up  DOCUMENTATION CODES:   Obesity unspecified  INTERVENTION:   Initiate trickle feeds of Vital AF 1.2 @ 10 ml/hr. Provides 288 kcal and 18g of protein. RD to follow-up 8/16 for plan and advancement.  Goal: Vital AF 1.2 at 30 ml/hr. 60 ml Prostat TID.   Tube feeding regimen provides 1464 kcal (67% of needs), 144 grams of protein, and 584 ml of H2O.  NUTRITION DIAGNOSIS:   Increased nutrient needs related to acute illness as evidenced by estimated needs.  Ongoing.  GOAL:   Provide needs based on ASPEN/SCCM guidelines  Not meeting.  MONITOR:   Vent status, Labs, Weight trends, TF tolerance, Skin, I & O's  REASON FOR ASSESSMENT:   Consult Enteral/tube feeding initiation and management (trickle feeds)  ASSESSMENT:   74 year old male w/ h/o rather functional dementia. Resides at [facility]. Presented to the ER on 8/11 w/ N/V/D x 2 days. Became progressively hypotensive so EMS called. On ER arrival SBP was in 80s, creatinine was 5.53 and had lactic acid of 3.08. He was started on abx, given IV hydration and his lactic acid initially cleared. He was undergoing CT scan evaluation to work up SBO. While flat in CT scan he vomited, then developed hypoxia and subsequent cardio-pulmonary arrest. His time to ROSC was 17 minutes. He was intubated. Remained hypotensive. PCCM asked to admit w/ working dx of SBO, aspiration PNA and shock.   No family present to provide PTA information.   Patient is currently intubated on ventilator support MV: 18.1 L/min Temp (24hrs), Avg:99.5 F (37.5 C), Min:98.6 F (37 C), Max:100 F (37.8 C)  Propofol: none  Labs reviewed: Low Na, K, Mg Elevated BUN & Creatinine Phos WNL  Diet Order:     Skin:  Reviewed, no issues  Last BM:  8/15  Height:   Ht Readings from Last 1 Encounters:  01/18/2015  (1.702 m)    Weight:   Wt Readings from Last 1 Encounters:  01/27/15 258 lb 13.1 oz (117.4 kg)    Ideal Body Weight:   67.27 kg (kg)  BMI:  Body mass index is 40.53 kg/(m^2).  Estimated Nutritional Needs:   Kcal:  9147-8295  Protein:  135-145g  Fluid:  2.1L/day  EDUCATION NEEDS:   No education needs identified at this time  Tilda Franco, MS, RD, LDN Pager: [phone] After Hours Pager: [phone]

## 2015-01-27 NOTE — Progress Notes (Signed)
Date:  January 27, 2015 U.R. performed for needs and level of care. Remains on the vent full support. Will continue to follow for Case Management needs.  Marcelle Smiling, RN, BSN, [place]   [phone]

## 2015-01-27 NOTE — Progress Notes (Signed)
eLink Physician-Brief Progress Note Patient Name: JODECI ROARTY DOB: 04/25/1941 MRN: [id number]   Date of Service  01/27/2015  HPI/Events of Note  Notified K & Mag low.  eICU Interventions  KCl IV & MagSulfate 1gm IV     Intervention Category Major Interventions: Electrolyte abnormality - evaluation and management  Lawanda Cousins 01/27/2015, 6:33 AM

## 2015-01-27 NOTE — Progress Notes (Signed)
PULMONARY / CRITICAL CARE MEDICINE   Name: Cody Vincent MRN: [id number] DOB: 1941-04-17    ADMISSION DATE:  01/22/2015 CONSULTATION DATE:  8/11  REFERRING MD : EDP   CHIEF COMPLAINT:  S/p cardiac arrest, SBO, septic shock   INITIAL PRESENTATION:  74 year old male w/ h/o rather functional dementia. Resides at [facility]. Presented to the ER on 8/11 w/ N/V/D x 2 days. Became progressively hypotensive so EMS called. On ER arrival SBP was in 80s, creatinine was 5.53 and had lactic acid of 3.08. He was started on abx, given IV hydration and his lactic acid initially cleared. He was undergoing CT scan evaluation to work up SBO. While flat in CT scan he vomited, then developed hypoxia and subsequent cardio-pulmonary arrest. His time to ROSC was 17 minutes. He was intubated. Remained hypotensive. PCCM asked to admit w/ working dx of SBO, aspiration PNA and shock.   STUDIES:  Renal US 8/12>>> No Hydro  SIGNIFICANT EVENTS: 8/11: admitted-->while in CT scan suffered 17 min PEA arrest. Admitted to ICU in MODS & refractory shock requiring 3 pressors. Placed on Nimbex d/t PF ratio <200.  Met w/ daughter Lattie Haw. Discussed poor prognosis, made DNR but cont current level of care w/ no further escalation.  8/12: BP holding on levo/vaso and epi gtts. CVP @ goal. WBC ct trending down, creatinine appeared to leveled off, ordered Renal US.  01/25/2015 : 40% fio2. On nimbex , fent. Versed -> BIS 32. On epi and levophed gtt. Lactic acidosis still persists    SUBJECTIVE/OVERNIGHT/INTERVAL HX 01/26/15:  Of sedation since today AM. Still unresponsive. Pressor requirements are improving.   VITAL SIGNS: Temp:  [98.6 F (37 C)-100 F (37.8 C)] 98.8 F (37.1 C) (08/15 0900) Pulse Rate:  [59-66] 63 (08/15 0930) Resp:  [21-35] 35 (08/15 0930) BP: (61-192)/(26-73) 192/73 mmHg (08/15 0353) SpO2:  [79 %-100 %] 94 % (08/15 0930) Arterial Line BP: (67-186)/(31-77) 115/45 mmHg (08/15 0930) FiO2 (%):  [40 %-50 %] 40 % (08/15  0353) Weight:  [258 lb 13.1 oz (117.4 kg)] 258 lb 13.1 oz (117.4 kg) (08/15 0930) HEMODYNAMICS: CVP:  [16 mmHg] 16 mmHg VENTILATOR SETTINGS: Vent Mode:  [-] PRVC FiO2 (%):  [40 %-50 %] 40 % Set Rate:  [35 bmp] 35 bmp Vt Set:  [550 mL] 550 mL PEEP:  [5 cmH20] 5 cmH20 Plateau Pressure:  [22 cmH20-28 cmH20] 26 cmH20 INTAKE / OUTPUT:  Intake/Output Summary (Last 24 hours) at 01/27/15 0957 Last data filed at 01/27/15 0900  Gross per 24 hour  Intake 3280.69 ml  Output   1935 ml  Net 1345.69 ml    PHYSICAL EXAMINATION: General: Unresponsive HEENT:  Orally intubated PERRLA Cardiovascular:  RRR, No MRG Lungs: Basal crackles Abdomen:  Soft Musculoskeletal:  Intact  Skin:  Cool and mottled. Anasarca  LABS: PULMONARY  Recent Labs Lab 01/22/2015 1215 01/18/2015 1625 01/19/2015 1830 01/25/15 0310 01/27/15 0358  PHART 7.101* 7.221*  --  7.423 7.398  PCO2ART 58.3* 43.9  --  28.0* 31.2*  PO2ART 199* 60.2*  --  56.4* 68.6*  HCO3 17.7* 17.3*  --  17.9* 18.7*  TCO2 17.6 16.4  --  16.7 17.7  O2SAT 98.2 88.9 43.6 87.7 93.1    CBC  Recent Labs Lab 01/25/15 0500 01/26/15 0510 01/27/15 0411  HGB 8.4* 8.1* 8.0*  HCT 25.1* 23.6* 23.5*  WBC 8.3 12.3* 12.6*  PLT 94* 88* 74*    COAGULATION  Recent Labs Lab 02/04/2015 1453  INR 1.53*  CARDIAC    Recent Labs Lab 01/22/2015 0920 01/25/15 1001 01/26/15 0510  TROPONINI 0.03 0.17* 0.15*   No results for input(s): PROBNP in the last 168 hours.   CHEMISTRY  Recent Labs Lab 01/24/15 0810 01/24/15 1743 01/24/15 2145 01/25/15 0500 01/26/15 0510 01/27/15 0411  NA 134* 135 136 133* 131* 128*  K 4.0 3.7 3.7 3.7 3.4* 3.1*  CL 100* 100* 102 101 101 99*  CO2 22 20* 19* 16* 18* 20*  GLUCOSE 292* 174* 133* 277* 183* 198*  BUN 70* 72* 74* 68* 65* 62*  CREATININE 5.37* 5.37* 5.22* 5.04* 4.64* 4.13*  CALCIUM 7.2* 7.2* 7.1* 6.8* 7.2* 7.5*  MG 0.9*  --   --  1.2* 1.5* 1.6*  PHOS 1.6* 3.6 4.0 3.4 2.9 2.7   Estimated Creatinine  Clearance: 19.8 mL/min (by C-G formula based on Cr of 4.13).   LIVER  Recent Labs Lab 02/08/2015 0920  01/22/2015 1453  01/24/15 1743 01/24/15 2145 01/25/15 0500 01/26/15 0510 01/27/15 0411  AST 28  --   --   --   --   --   --   --   --   ALT 19  --   --   --   --   --   --   --   --   ALKPHOS 88  --   --   --   --   --   --   --   --   BILITOT 1.3*  --   --   --   --   --   --   --   --   PROT 6.9  --   --   --   --   --   --   --   --   ALBUMIN 3.7  < >  --   < > 1.9* 1.9* 1.7* 1.6* 1.5*  INR  --   --  1.53*  --   --   --   --   --   --   < > = values in this interval not displayed.   INFECTIOUS  Recent Labs Lab 01/24/2015 1453 01/24/15 0550  01/25/15 0500 01/25/15 1001 01/26/15 0510 01/27/15 0411  LATICACIDVEN  --   --   < > 5.2* 3.3* 2.9* 2.0  PROCALCITON 13.23 49.11  --  36.62  --   --   --   < > = values in this interval not displayed.   ENDOCRINE CBG (last 3)   Recent Labs  01/27/15 0014 01/27/15 0414 01/27/15 0744  GLUCAP 140* 157* 121*   IMAGING  Dg Chest Port 1 View  01/27/2015   CLINICAL DATA:  Respiratory arrest and possible aspiration pneumonia. Assess endotracheal tube. Initial encounter.  EXAM: PORTABLE CHEST - 1 VIEW  COMPARISON:  01/26/2015 and 01/25/2015.  FINDINGS: 0448 hr. Endotracheal tube is unchanged within the midtrachea. Nasogastric tube is looped in the stomach with the tip near the gastroesophageal junction. Left subclavian pacemaker leads are unchanged. Right IJ central venous catheter is unchanged at the SVC right atrial level.  The heart size and mediastinal contours are stable. There has been no significant change in the bibasilar infiltrates. No large pleural effusion or pneumothorax identified. The bones appear unchanged.  IMPRESSION: Stable support system and lines. Stable bibasilar infiltrates compatible with aspiration pneumonia.   Electronically Signed   By: Richardean Sale M.D.   On: 01/27/2015 07:25   Dg Chest Port 1  View  01/26/2015  CLINICAL DATA:  Follow-up endotracheal  EXAM: PORTABLE CHEST - 1 VIEW  COMPARISON:  01/25/2015  FINDINGS: Cardiomediastinal silhouette is stable. Stable endotracheal and NG tube position. Dual lead cardiac pacemaker is unchanged in position. Right IJ central line is unchanged in position. Persistent bilateral lower lobe patchy infiltrates.  IMPRESSION: No significant change. Stable support apparatus. Dual lead cardiac pacemaker is unchanged in position. Persistent bilateral lower lobe patchy infiltrates.   Electronically Signed   By: Lahoma Crocker M.D.   On: 01/26/2015 09:22   Dg Abd Portable 1v  01/27/2015   CLINICAL DATA:  Nasogastric tube.  Followup small bowel obstruction.  EXAM: PORTABLE ABDOMEN - 1 VIEW  COMPARISON:  01/24/2015  FINDINGS: Nasogastric tube tip remains coiled into the fundus of the stomach.  Continued improvement in bowel gas pattern, now with solitary moderately distended loop seen in the left abdomen. Few bubbles of gas are distributed throughout the abdomen, which could be within predominately fluid dilated bowel. No definitive pneumatosis. No concerning intra-abdominal mass effect or calcification.  Bibasilar lung opacities, known based on dedicated chest radiography.  IMPRESSION: Continued clearing of gas dilated small bowel.   Electronically Signed   By: Monte Fantasia M.D.   On: 01/27/2015 05:49   ASSESSMENT / PLAN:  PULMONARY OETT 8/11>>> A: Acute hypoxic and hypercarbic respiratory arrest (P/F ratio <200) -> aspn pna/severe ARDS On minimal vent setting today. Off 48h  nimbex on 01/25/15.   P:   ARDS protocol to continue Fent and versed gtt off today to wake up the patient  F/u  cultures   CARDIOVASCULAR CVL right IJ CVL 8/11>>>> A:  Refractory Septic shock/MODS S/p cardiopulmonary PEA arrest (17 minutes to ROSC in spite of provider ACLS) H/o symptomatic bradycardia w/ PPM    On 2 pressors: levo/vaso/ CVP 20. Holding goal MAP. Weaning down on the  pressors  P:  Volume resuscitation completed; will keep CVP goal 8-12. Start diureses when off pressors Reduce fluids to [facility] 01/26/2015 MAP goal > 65 -On levophed, vasopressin    RENAL A:   Acute renal failure- Lactic acidosis No hydro renal US 01/24/15 Both improving rapidly   P:   KVO fluids Renal dose meds Cont strict I&O  GASTROINTESTINAL A:   N/V and abd pain SBO-->abd softer . Continued improvement in abd imaging.   P:   Start trickle tube feeds. PPI  Will hold off on CT abd/pelvis for now   HEMATOLOGIC A:   Anemia of critical illness Mild coagulopathy in setting of sepsis Worsening thrombocytopenia  Likely due to sepsis and critical illness.  Low prob 4T score for HIT.  P:  Will stop heparin and check HIT panel SCDs for DVT prophylaxis. Trend CBC Transfuse for Hgb < 7   INFECTIOUS A:   Septic shock likely UT source +/- bacterial translocation from gut  Aspiration PNA  C.Diff test is negative. P:   BCx2 8/11>>> UC 8/11>>> Sputum: resp culture 8/11: few gpc pairs/chains>>> Abx: vanc 8/11>>> Zosyn 8/11>>>  ENDOCRINE A:   Hyperglycemia   P:   SSI protocol   NEUROLOGIC A:   Acute encephalopathy in setting of sepsis, acidosis and s/p cardiac arrest: Concerned about possible hypoxic injury   -having some pain response and movement on ltd wua (full made tough by ARDS deep sedation need.  P:   RASS goal: 0 Fentanyl and versed gtt stopped today AM to wake up the patient.  FAMILY  - Updates: no family at bedside 01/26/2015 - Inter-disciplinary family meet  or Palliative Care meeting due by: 8/16   CODE    Code Status Orders        Start     Ordered   01/17/2015 1611  Do not attempt resuscitation (DNR)   Continuous    Question:  Do not initiate new interventions  Answer:  Yes   01/20/2015 1610     TODAY'S SUMMARY:  refractory shock MODS.   ARDS, shock  and creat all better. But neuro status/recovery is unclear. Will arrange a family meeting  if he does not wake up.   The patient is critically ill with multiple organ systems failure and requires high complexity decision making for assessment and support, frequent evaluation and titration of therapies, application of advanced monitoring technologies and extensive interpretation of multiple databases.   Critical Care Time devoted to patient care services described in this note is  40  Minutes. This time reflects time of care of this signee Dr Vaughan Browner. This critical care time does not reflect procedure time, or teaching time or supervisory time of PA/NP/Med student/Med Resident etc but could involve care discussion time    Marshell Garfinkel MD [facility] Pager [phone] If no answer or after 3pm call: [phone] 01/27/2015, 9:57 AM

## 2015-01-27 NOTE — Progress Notes (Signed)
eLink Physician-Brief Progress Note Patient Name: Cody Vincent DOB: 05-27-1941 MRN: [id number]   Date of Service  01/27/2015  HPI/Events of Note  Blood glucose = 170.   eICU Interventions  Will order Q 4 blood glucose and sensitive Novolog SSI.     Intervention Category Intermediate Interventions: Hyperglycemia - evaluation and treatment  Gearlene Godsil Eugene 01/27/2015, 5:11 PM

## 2015-01-28 ENCOUNTER — Inpatient Hospital Stay (HOSPITAL_COMMUNITY)
Admit: 2015-01-28 | Discharge: 2015-01-28 | Disposition: A | Discharge status: Home / Self Care | Payer: Medicare Other | Attending: Neurology | Admitting: Neurology

## 2015-01-28 DIAGNOSIS — I469 Cardiac arrest, cause unspecified: Secondary | ICD-10-CM

## 2015-01-28 LAB — CBC WITH DIFFERENTIAL/PLATELET
BASOS ABS: 0 10*3/uL (ref 0.0–0.1)
BASOS PCT: 0 % (ref 0–1)
EOS PCT: 2 % (ref 0–5)
Eosinophils Absolute: 0.3 10*3/uL (ref 0.0–0.7)
HEMATOCRIT: 23.4 % — AB (ref 39.0–52.0)
Hemoglobin: 8.1 g/dL — ABNORMAL LOW (ref 13.0–17.0)
Lymphocytes Relative: 4 % — ABNORMAL LOW (ref 12–46)
Lymphs Abs: 0.6 10*3/uL — ABNORMAL LOW (ref 0.7–4.0)
MCH: 29.2 pg (ref 26.0–34.0)
MCHC: 34.6 g/dL (ref 30.0–36.0)
MCV: 84.5 fL (ref 78.0–100.0)
MONO ABS: 1.5 10*3/uL — AB (ref 0.1–1.0)
Monocytes Relative: 10 % (ref 3–12)
NEUTROS ABS: 12.1 10*3/uL — AB (ref 1.7–7.7)
Neutrophils Relative %: 83 % — ABNORMAL HIGH (ref 43–77)
PLATELETS: 82 10*3/uL — AB (ref 150–400)
RBC: 2.77 MIL/uL — ABNORMAL LOW (ref 4.22–5.81)
RDW: 14.4 % (ref 11.5–15.5)
WBC: 14.6 10*3/uL — ABNORMAL HIGH (ref 4.0–10.5)

## 2015-01-28 LAB — PHOSPHORUS: Phosphorus: 3.2 mg/dL (ref 2.5–4.6)

## 2015-01-28 LAB — COMPREHENSIVE METABOLIC PANEL
ALBUMIN: 1.5 g/dL — AB (ref 3.5–5.0)
ALT: 15 U/L — ABNORMAL LOW (ref 17–63)
ANION GAP: 9 (ref 5–15)
AST: 32 U/L (ref 15–41)
Alkaline Phosphatase: 111 U/L (ref 38–126)
BUN: 61 mg/dL — ABNORMAL HIGH (ref 6–20)
CHLORIDE: 99 mmol/L — AB (ref 101–111)
CO2: 19 mmol/L — AB (ref 22–32)
Calcium: 7.7 mg/dL — ABNORMAL LOW (ref 8.9–10.3)
Creatinine, Ser: 3.26 mg/dL — ABNORMAL HIGH (ref 0.61–1.24)
GFR calc Af Amer: 20 mL/min — ABNORMAL LOW (ref >60)
GFR calc non Af Amer: 18 mL/min — ABNORMAL LOW (ref >60)
GLUCOSE: 195 mg/dL — AB (ref 65–99)
POTASSIUM: 3.2 mmol/L — AB (ref 3.5–5.1)
SODIUM: 127 mmol/L — AB (ref 135–145)
Total Bilirubin: 6.5 mg/dL — ABNORMAL HIGH (ref 0.3–1.2)
Total Protein: 4.3 g/dL — ABNORMAL LOW (ref 6.5–8.1)

## 2015-01-28 LAB — CULTURE, BLOOD (ROUTINE X 2)
Culture: NO GROWTH
Culture: NO GROWTH

## 2015-01-28 LAB — GLUCOSE, CAPILLARY
GLUCOSE-CAPILLARY: 142 mg/dL — AB (ref 65–99)
GLUCOSE-CAPILLARY: 154 mg/dL — AB (ref 65–99)
Glucose-Capillary: 165 mg/dL — ABNORMAL HIGH (ref 65–99)
Glucose-Capillary: 170 mg/dL — ABNORMAL HIGH (ref 65–99)
Glucose-Capillary: 172 mg/dL — ABNORMAL HIGH (ref 65–99)
Glucose-Capillary: 165 mg/dL — ABNORMAL HIGH (ref 65–99)

## 2015-01-28 LAB — MAGNESIUM: Magnesium: 1.8 mg/dL (ref 1.7–2.4)

## 2015-01-28 MED ORDER — FREE WATER
200.0000 mL | Freq: Three times a day (TID) | Status: DC
  Administered 2015-01-28 (×2): 200 mL

## 2015-01-28 MED ORDER — ADULT MULTIVITAMIN LIQUID CH
5.0000 mL | Freq: Every day | ORAL | Status: DC
  Administered 2015-01-28: 5 mL
  Filled 2015-01-28 (×3): qty 5

## 2015-01-28 MED ORDER — PIPERACILLIN-TAZOBACTAM 3.375 G IVPB
3.3750 g | Freq: Three times a day (TID) | INTRAVENOUS | Status: DC
  Administered 2015-01-28 – 2015-01-29 (×3): 3.375 g via INTRAVENOUS
  Filled 2015-01-28 (×2): qty 50

## 2015-01-28 MED ORDER — VITAL AF 1.2 CAL PO LIQD
1000.0000 mL | ORAL | Status: DC
  Filled 2015-01-28: qty 1000

## 2015-01-28 MED ORDER — PRO-STAT SUGAR FREE PO LIQD
60.0000 mL | Freq: Two times a day (BID) | ORAL | Status: DC
  Administered 2015-01-28 (×2): 60 mL
  Filled 2015-01-28: qty 60

## 2015-01-28 NOTE — Consult Note (Signed)
Consult Reason for Consult:cardiac arrest Referring Physician: Dr Isaiah Serge CCM  CC:   HPI: Cody Vincent is an 74 y.o. male hx of dementia (resides in SNF), presented to ED on 8/11 with 2 days of N/V/D with worsening hypotension. While in CT scan he vomitted and then developed hypoxia with subsequent cardio-pulmonary arrest. ROSC 17 minutes. Intubated. Remained hypotensive with initial working diagnosis of SBO, aspiration PNA and shock. Currently weaning down off 2 pressors. Has been off sedation since morning of 8/15. Remains unresponsive.   Last seen by outpatient neurologist (Dr Karel Jarvis of [place]) on 7/26. At that time MMSE was 18/30.   Past Medical History  Diagnosis Date  . Near syncope   . Sinus bradycardia   . Diastolic murmur   . Hypertension   . Renal insufficiency   . History of tobacco abuse   . Syncope   . Dementia   . Seborrheic dermatitis   . Hyperlipidemia     Past Surgical History  Procedure Laterality Date  . Pacemaker insertion  03/19/13    SJM pacemaker implanted for symptomatic sinus bradycardia by Dr Johney Frame  . Kidney stone surgery  06/2013  . Multiple tooth extractions      Full Dentures  . Permanent pacemaker insertion N/A 03/19/2013    Procedure: PERMANENT PACEMAKER INSERTION;  Surgeon: Gardiner Rhyme, MD;  Location: [facility] CATH LAB;  Service: Cardiovascular;  Laterality: N/A;  . Left heart catheterization with coronary angiogram N/A 09/10/2014    Procedure: LEFT HEART CATHETERIZATION WITH CORONARY ANGIOGRAM;  Surgeon: Lyn Records, MD;  [facility] C[facility];  Service: Cardiovascular;  Laterality: N/A;  . Cardiac catheterization      Family History  Problem Relation Age of Onset  . Heart disease Father     Deceased-90s  . Cancer Father   . Stroke Mother 61    Deceased  . Heart disease Mother   . Dementia Mother   . Healthy Brother     x1  . Hypertension Daughter     x1    Social History:  reports that he quit smoking about 18 years ago. His  smoking use included Cigarettes. He has a 117 pack-year smoking history. He has never used smokeless tobacco. He reports that he does not drink alcohol or use illicit drugs.  No Known Allergies  Medications:  Scheduled: . antiseptic oral rinse  7 mL Mouth Rinse QID  . chlorhexidine gluconate  15 mL Mouth Rinse BID  . feeding supplement (PRO-STAT SUGAR FREE 64)  60 mL Per Tube BID  . [START ON 01/29/2015] feeding supplement (VITAL AF 1.2 CAL)  1,000 mL Per Tube Q24H  . fentaNYL (SUBLIMAZE) injection  100 mcg Intravenous Once  . fentaNYL (SUBLIMAZE) injection  50 mcg Intravenous Once  . free water  200 mL Per Tube 3 times per day  . insulin aspart  0-9 Units Subcutaneous 6 times per day  . levothyroxine  62.5 mcg Intravenous Daily  . midazolam  2 mg Intravenous Once  . multivitamin  5 mL Per Tube Daily  . pantoprazole (PROTONIX) IV  40 mg Intravenous QHS  . piperacillin-tazobactam (ZOSYN)  IV  3.375 g Intravenous 3 times per day    ROS: Out of a complete 14 system review, the patient complains of only the following symptoms, and all other reviewed systems are negative. Unable to obtain Physical Examination: Filed Vitals:   01/28/15 1000  BP:   Pulse: 62  Temp: 98.2 F (36.8 C)  Resp: 35  Physical Exam  Constitutional: He appears well-developed and well-nourished.  Psych: intubated Eyes: No scleral injection HENT: No OP obstrucion Head: Normocephalic.  Cardiovascular: Normal rate and regular rhythm.  Respiratory: Effort normal and breath sounds normal.  GI: Soft. Bowel sounds are normal. No distension. There is no tenderness.  Skin: WDI  Neurologic Examination Mental Status: Intubated. Eyes closed, will not open to voice, attempts to close eyes when tried to open. Not following commands. Not making eye contact Cranial Nerves: II: optic discs unable to be visualized, blink to threat not noted, pupils equal, round, reactive to light  III,IV, VI: ptosis not present,  eyes midline, no gaze deviation. Doll's eyes positive V,VII: smile symmetric, + Corneal reflex bilaterally VIII: unable to test IX,X: cough and gag reflex present Motor: No spontaneous movement of extremities noted Sensory: Withdrawals RUE > LUE to noxious stimuli, weak withdrawal bilateral LE to noxious stimuli Deep Tendon Reflexes: 1+ and symmetric throughout Plantars: Right: downgoing   Left: mute Cerebellar: Unable to test Gait: unable to test  Laboratory Studies:   Basic Metabolic Panel:  Recent Labs Lab 01/24/15 0810  01/24/15 2145 01/25/15 0500 01/26/15 0510 01/27/15 0411 01/28/15 0400  NA 134*  < > 136 133* 131* 128* 127*  K 4.0  < > 3.7 3.7 3.4* 3.1* 3.2*  CL 100*  < > 102 101 101 99* 99*  CO2 22  < > 19* 16* 18* 20* 19*  GLUCOSE 292*  < > 133* 277* 183* 198* 195*  BUN 70*  < > 74* 68* 65* 62* 61*  CREATININE 5.37*  < > 5.22* 5.04* 4.64* 4.13* 3.26*  CALCIUM 7.2*  < > 7.1* 6.8* 7.2* 7.5* 7.7*  MG 0.9*  --   --  1.2* 1.5* 1.6* 1.8  PHOS 1.6*  < > 4.0 3.4 2.9 2.7 3.2  < > = values in this interval not displayed.  Liver Function Tests:  Recent Labs Lab February 05, 2015 0920  01/24/15 2145 01/25/15 0500 01/26/15 0510 01/27/15 0411 01/28/15 0400  AST 28  --   --   --   --   --  32  ALT 19  --   --   --   --   --  15*  ALKPHOS 88  --   --   --   --   --  111  BILITOT 1.3*  --   --   --   --   --  6.5*  PROT 6.9  --   --   --   --   --  4.3*  ALBUMIN 3.7  < > 1.9* 1.7* 1.6* 1.5* 1.5*  < > = values in this interval not displayed.  Recent Labs Lab 02-05-2015 0920  LIPASE 22   No results for input(s): AMMONIA in the last 168 hours.  CBC:  Recent Labs Lab 01/24/15 0810 01/25/15 0500 01/26/15 0510 01/27/15 0411 01/28/15 0400  WBC 8.6 8.3 12.3* 12.6* 14.6*  NEUTROABS 7.0 7.1 10.6* 10.6* 12.1*  HGB 10.1* 8.4* 8.1* 8.0* 8.1*  HCT 28.8* 25.1* 23.6* 23.5* 23.4*  MCV 86.7 86.0 85.2 84.5 84.5  PLT 139* 94* 88* 74* 82*    Cardiac Enzymes:  Recent Labs Lab  February 05, 2015 0920 01/25/15 1001 01/26/15 0510  TROPONINI 0.03 0.17* 0.15*    BNP: Invalid input(s): POCBNP  CBG:  Recent Labs Lab 01/27/15 1645 01/27/15 2002 01/28/15 0026 01/28/15 0445 01/28/15 0805  GLUCAP 169* 199* 142* 165* 170*    Microbiology: Results for orders placed or  performed during the hospital encounter of 01/25/2015  Blood culture (routine x 2)     Status: None (Preliminary result)   Collection Time: 01/27/2015 10:09 AM  Result Value Ref Range Status   Specimen Description BLOOD RIGHT ANTECUBITAL  Final   Special Requests BOTTLES DRAWN AEROBIC AND ANAEROBIC 3 ML  Final   Culture   Final    NO GROWTH 4 DAYS Performed at [facility]    Report Status PENDING  Incomplete  Blood culture (routine x 2)     Status: None (Preliminary result)   Collection Time: 02/09/2015 10:48 AM  Result Value Ref Range Status   Specimen Description BLOOD BLOOD LEFT ARM  Final   Special Requests BOTTLES DRAWN AEROBIC AND ANAEROBIC  Final   Culture   Final    NO GROWTH 4 DAYS Performed at [facility]    Report Status PENDING  Incomplete  MRSA PCR Screening     Status: Abnormal   Collection Time: 01/20/2015  2:45 PM  Result Value Ref Range Status   MRSA by PCR POSITIVE (A) NEGATIVE Final    Comment:        The GeneXpert MRSA Assay (FDA approved for NASAL specimens only), is one component of a comprehensive MRSA colonization surveillance program. It is not intended to diagnose MRSA infection nor to guide or monitor treatment for MRSA infections. RESULT CALLED TO, READ BACK BY AND VERIFIED WITH: FAPFAW A. AT 1700 ON 01/29/2015 BY INGLEE   Culture, respiratory (NON-Expectorated)     Status: None   Collection Time: 01/26/2015  5:07 PM  Result Value Ref Range Status   Specimen Description TRACHEAL ASPIRATE  Final   Special Requests NONE  Final   Gram Stain   Final    RARE WBC PRESENT, PREDOMINANTLY PMN RARE SQUAMOUS EPITHELIAL CELLS PRESENT FEW GRAM POSITIVE  COCCI IN PAIRS IN CHAINS Performed at [facility]    Culture   Final    Non-Pathogenic Oropharyngeal-type Flora Isolated. Performed at [facility]    Report Status 01/26/2015 FINAL  Final  C difficile quick scan w PCR reflex     Status: None   Collection Time: 01/26/15  3:55 PM  Result Value Ref Range Status   C Diff antigen NEGATIVE NEGATIVE Final   C Diff toxin NEGATIVE NEGATIVE Final   C Diff interpretation Negative for toxigenic C. difficile  Final    Coagulation Studies: No results for input(s): LABPROT, INR in the last 72 hours.  Urinalysis:  Recent Labs Lab 01/26/2015 1206  COLORURINE AMBER*  LABSPEC 1.013  PHURINE 7.0  GLUCOSEU NEGATIVE  HGBUR MODERATE*  BILIRUBINUR NEGATIVE  KETONESUR NEGATIVE  PROTEINUR 30*  UROBILINOGEN 0.2  NITRITE NEGATIVE  LEUKOCYTESUR LARGE*    Lipid Panel:     Component Value Date/Time   CHOL 147 05/31/2014 1141   TRIG 161.0* 05/31/2014 1141   HDL 33.60* 05/31/2014 1141   CHOLHDL 4 05/31/2014 1141   VLDL 32.2 05/31/2014 1141   LDLCALC 81 05/31/2014 1141    HgbA1C:  Lab Results  Component Value Date   HGBA1C 5.8 05/31/2014    Urine Drug Screen:     Component Value Date/Time   LABOPIA NONE DETECTED 08/09/2014 1548   COCAINSCRNUR NONE DETECTED 08/09/2014 1548   LABBENZ NONE DETECTED 08/09/2014 1548   AMPHETMU NONE DETECTED 08/09/2014 1548   THCU NONE DETECTED 08/09/2014 1548   LABBARB NONE DETECTED 08/09/2014 1548    Alcohol Level: No results for input(s): ETH in  the last 168 hours.  Other results:  Imaging: Dg Chest Port 1 View  01/27/2015   CLINICAL DATA:  Respiratory arrest and possible aspiration pneumonia. Assess endotracheal tube. Initial encounter.  EXAM: PORTABLE CHEST - 1 VIEW  COMPARISON:  01/26/2015 and 01/25/2015.  FINDINGS: 0448 hr. Endotracheal tube is unchanged within the midtrachea. Nasogastric tube is looped in the stomach with the tip near the gastroesophageal junction. Left subclavian  pacemaker leads are unchanged. Right IJ central venous catheter is unchanged at the SVC right atrial level.  The heart size and mediastinal contours are stable. There has been no significant change in the bibasilar infiltrates. No large pleural effusion or pneumothorax identified. The bones appear unchanged.  IMPRESSION: Stable support system and lines. Stable bibasilar infiltrates compatible with aspiration pneumonia.   Electronically Signed   By: Carey Bullocks M.D.   On: 01/27/2015 07:25   Dg Abd Portable 1v  01/27/2015   CLINICAL DATA:  Nasogastric tube.  Followup small bowel obstruction.  EXAM: PORTABLE ABDOMEN - 1 VIEW  COMPARISON:  01/24/2015  FINDINGS: Nasogastric tube tip remains coiled into the fundus of the stomach.  Continued improvement in bowel gas pattern, now with solitary moderately distended loop seen in the left abdomen. Few bubbles of gas are distributed throughout the abdomen, which could be within predominately fluid dilated bowel. No definitive pneumatosis. No concerning intra-abdominal mass effect or calcification.  Bibasilar lung opacities, known based on dedicated chest radiography.  IMPRESSION: Continued clearing of gas dilated small bowel.   Electronically Signed   By: Marnee Spring M.D.   On: 01/27/2015 05:49     Assessment/Plan:  74y/o gentleman hx of dementia admitted on 8/11 after cardiac arrest with around 17 minutes before ROSC. Hospital course complicated by septic shock and respiratory arrest. Currently off sedation  > 24 hrs with minimal to no improvement in mental status. Suspect underlying sepsis is playing a role in his depressed mental status but cannot rule out hypoxic injury. With his baseline dementia have guarded prognosis for a meaningful neurological recovery at this time.  Had extensive discussion with patients brother and sister-in-law in regarding his current condition and prognosis. They report that he would not want a trach or PEG tube. Patients  daughter is Management consultant and is not present at this time. Will need to set up family meeting to discuss goals of care.   -EEG  -family meeting to discuss goals of care   Elspeth Cho, DO [facility] [phone]  If 7pm- 7am, please page neurology on call as listed in AMION. 01/28/2015, 11:16 AM

## 2015-01-28 NOTE — Progress Notes (Signed)
PULMONARY / CRITICAL CARE MEDICINE   Name: Cody Vincent MRN: [id number] DOB: 1941/02/01    ADMISSION DATE:  02/12/2015 CONSULTATION DATE:  8/11  REFERRING MD : EDP   CHIEF COMPLAINT:  S/p cardiac arrest, SBO, septic shock   INITIAL PRESENTATION:  74 year old male w/ h/o rather functional dementia. Resides at [facility]. Presented to the ER on 8/11 w/ N/V/D x 2 days. Became progressively hypotensive so EMS called. On ER arrival SBP was in 80s, creatinine was 5.53 and had lactic acid of 3.08. He was started on abx, given IV hydration and his lactic acid initially cleared. He was undergoing CT scan evaluation to work up SBO. While flat in CT scan he vomited, then developed hypoxia and subsequent cardio-pulmonary arrest. His time to ROSC was 17 minutes. He was intubated. Remained hypotensive. PCCM asked to admit w/ working dx of SBO, aspiration PNA and shock.   STUDIES:  Renal US 8/12>>> No Hydro  SIGNIFICANT EVENTS: 8/11: admitted-->while in CT scan suffered 17 min PEA arrest. Admitted to ICU in MODS & refractory shock requiring 3 pressors. Placed on Nimbex d/t PF ratio <200.  Met w/ daughter Lattie Haw. Discussed poor prognosis, made DNR but cont current level of care w/ no further escalation.  8/12: BP holding on levo/vaso and epi gtts. CVP @ goal. WBC ct trending down, creatinine appeared to leveled off, ordered Renal US.  01/25/2015 : 40% fio2. On nimbex , fent. Versed -> BIS 32. On epi and levophed gtt. Lactic acidosis still persists  SUBJECTIVE/OVERNIGHT/INTERVAL HX 01/26/15:  Of sedation since today AM. Still unresponsive. Pressor requirements are improving.   VITAL SIGNS: Temp:  [98.4 F (36.9 C)-99.1 F (37.3 C)] 98.4 F (36.9 C) (08/16 0900) Pulse Rate:  [59-66] 63 (08/16 0900) Resp:  [22-37] 35 (08/16 0900) BP: (124)/(49) 124/49 mmHg (08/16 0840) SpO2:  [94 %-100 %] 98 % (08/16 0900) Arterial Line BP: (98-190)/(40-78) 138/54 mmHg (08/16 0900) FiO2 (%):  [40 %] 40 % (08/16  0840) Weight:  [266 lb 5.1 oz (120.8 kg)] 266 lb 5.1 oz (120.8 kg) (08/16 0300) HEMODYNAMICS: CVP:  [10 mmHg] 10 mmHg VENTILATOR SETTINGS: Vent Mode:  [-] PRVC FiO2 (%):  [40 %] 40 % Set Rate:  [35 bmp] 35 bmp Vt Set:  [550 mL] 550 mL PEEP:  [5 cmH20] 5 cmH20 Plateau Pressure:  [23 cmH20-27 cmH20] 25 cmH20 INTAKE / OUTPUT:  Intake/Output Summary (Last 24 hours) at 01/28/15 1022 Last data filed at 01/28/15 0900  Gross per 24 hour  Intake 3007.36 ml  Output   1855 ml  Net 1152.36 ml    PHYSICAL EXAMINATION: General: Unresponsive HEENT:  Orally intubated PERRLA Cardiovascular:  RRR, No MRG Lungs: Basal crackles Abdomen:  Soft Musculoskeletal:  Intact  Skin:  Cool and mottled. Anasarca  LABS: PULMONARY  Recent Labs Lab 02/06/2015 1215 01/24/2015 1625 01/26/2015 1830 01/25/15 0310 01/27/15 0358  PHART 7.101* 7.221*  --  7.423 7.398  PCO2ART 58.3* 43.9  --  28.0* 31.2*  PO2ART 199* 60.2*  --  56.4* 68.6*  HCO3 17.7* 17.3*  --  17.9* 18.7*  TCO2 17.6 16.4  --  16.7 17.7  O2SAT 98.2 88.9 43.6 87.7 93.1    CBC  Recent Labs Lab 01/26/15 0510 01/27/15 0411 01/28/15 0400  HGB 8.1* 8.0* 8.1*  HCT 23.6* 23.5* 23.4*  WBC 12.3* 12.6* 14.6*  PLT 88* 74* 82*    COAGULATION  Recent Labs Lab 02/08/2015 1453  INR 1.53*    CARDIAC  Recent Labs Lab 02/06/2015 0920 01/25/15 1001 01/26/15 0510  TROPONINI 0.03 0.17* 0.15*   No results for input(s): PROBNP in the last 168 hours.   CHEMISTRY  Recent Labs Lab 01/24/15 0810  01/24/15 2145 01/25/15 0500 01/26/15 0510 01/27/15 0411 01/28/15 0400  NA 134*  < > 136 133* 131* 128* 127*  K 4.0  < > 3.7 3.7 3.4* 3.1* 3.2*  CL 100*  < > 102 101 101 99* 99*  CO2 22  < > 19* 16* 18* 20* 19*  GLUCOSE 292*  < > 133* 277* 183* 198* 195*  BUN 70*  < > 74* 68* 65* 62* 61*  CREATININE 5.37*  < > 5.22* 5.04* 4.64* 4.13* 3.26*  CALCIUM 7.2*  < > 7.1* 6.8* 7.2* 7.5* 7.7*  MG 0.9*  --   --  1.2* 1.5* 1.6* 1.8  PHOS 1.6*  <  > 4.0 3.4 2.9 2.7 3.2  < > = values in this interval not displayed. Estimated Creatinine Clearance: 25.5 mL/min (by C-G formula based on Cr of 3.26).   LIVER  Recent Labs Lab 01/25/2015 0920  02/10/2015 1453  01/24/15 2145 01/25/15 0500 01/26/15 0510 01/27/15 0411 01/28/15 0400  AST 28  --   --   --   --   --   --   --  32  ALT 19  --   --   --   --   --   --   --  15*  ALKPHOS 88  --   --   --   --   --   --   --  111  BILITOT 1.3*  --   --   --   --   --   --   --  6.5*  PROT 6.9  --   --   --   --   --   --   --  4.3*  ALBUMIN 3.7  < >  --   < > 1.9* 1.7* 1.6* 1.5* 1.5*  INR  --   --  1.53*  --   --   --   --   --   --   < > = values in this interval not displayed.   INFECTIOUS  Recent Labs Lab 02/05/2015 1453 01/24/15 0550  01/25/15 0500 01/25/15 1001 01/26/15 0510 01/27/15 0411  LATICACIDVEN  --   --   < > 5.2* 3.3* 2.9* 2.0  PROCALCITON 13.23 49.11  --  36.62  --   --   --   < > = values in this interval not displayed.   ENDOCRINE CBG (last 3)   Recent Labs  01/28/15 0026 01/28/15 0445 01/28/15 0805  GLUCAP 142* 165* 170*   IMAGING  Dg Chest Port 1 View  01/27/2015   CLINICAL DATA:  Respiratory arrest and possible aspiration pneumonia. Assess endotracheal tube. Initial encounter.  EXAM: PORTABLE CHEST - 1 VIEW  COMPARISON:  01/26/2015 and 01/25/2015.  FINDINGS: 0448 hr. Endotracheal tube is unchanged within the midtrachea. Nasogastric tube is looped in the stomach with the tip near the gastroesophageal junction. Left subclavian pacemaker leads are unchanged. Right IJ central venous catheter is unchanged at the SVC right atrial level.  The heart size and mediastinal contours are stable. There has been no significant change in the bibasilar infiltrates. No large pleural effusion or pneumothorax identified. The bones appear unchanged.  IMPRESSION: Stable support system and lines. Stable bibasilar infiltrates compatible with aspiration pneumonia.   Electronically  Signed   By: Richardean Sale M.D.   On: 01/27/2015 07:25   Dg Abd Portable 1v  01/27/2015   CLINICAL DATA:  Nasogastric tube.  Followup small bowel obstruction.  EXAM: PORTABLE ABDOMEN - 1 VIEW  COMPARISON:  01/24/2015  FINDINGS: Nasogastric tube tip remains coiled into the fundus of the stomach.  Continued improvement in bowel gas pattern, now with solitary moderately distended loop seen in the left abdomen. Few bubbles of gas are distributed throughout the abdomen, which could be within predominately fluid dilated bowel. No definitive pneumatosis. No concerning intra-abdominal mass effect or calcification.  Bibasilar lung opacities, known based on dedicated chest radiography.  IMPRESSION: Continued clearing of gas dilated small bowel.   Electronically Signed   By: Monte Fantasia M.D.   On: 01/27/2015 05:49   ASSESSMENT / PLAN:  PULMONARY OETT 8/11>>> A: Acute hypoxic and hypercarbic respiratory arrest (P/F ratio <200) -> aspn pna/severe ARDS On minimal vent setting today. Off 48h  nimbex on 01/25/15.   P:   ARDS protocol to continue Fent and versed gtt since yesterday.  F/u  cultures   CARDIOVASCULAR CVL right IJ CVL 8/11>>>> A:  Refractory Septic shock/MODS S/p cardiopulmonary PEA arrest (17 minutes to ROSC in spite of provider ACLS) H/o symptomatic bradycardia w/ PPM    On 2 pressors: levo/vaso/ CVP 20. Holding goal MAP. Weaning down on the pressors  P:  Volume resuscitation completed; will keep CVP goal 8-12. Start diureses when off pressors Reduce fluids to [facility] 01/26/2015 MAP goal > 65 -On levophed, vasopressin    RENAL A:   Acute renal failure- Lactic acidosis No hydro renal US 01/24/15 Both improving rapidly  P:   KVO fluids Renal dose meds Cont strict I&O Work up hyponatremia with serum osms, TSH, Cortisol  GASTROINTESTINAL A:   N/V and abd pain SBO-->abd softer . Continued improvement in abd imaging.   P:   Tolerating tube feeds. Will advance rate. PPI    HEMATOLOGIC A:   Anemia of critical illness Mild coagulopathy in setting of sepsis Worsening thrombocytopenia  Likely due to sepsis and critical illness.  Low prob 4T score for HIT.  P:  Will stop heparin and check HIT panel SCDs for DVT prophylaxis. Trend CBC Transfuse for Hgb < 7   INFECTIOUS A:   Septic shock likely UT source +/- bacterial translocation from gut  Aspiration PNA  C.Diff test is negative. P:   BCx2 8/11>>> UC 8/11>>> Sputum: resp culture 8/11: few gpc pairs/chains>>> Abx: vanc 8/11>>> Zosyn 8/11>>>  ENDOCRINE A:   Hyperglycemia   P:   SSI protocol   NEUROLOGIC A:   Acute encephalopathy in setting of sepsis, acidosis and s/p cardiac arrest: Concerned about possible hypoxic injury   -having some pain response and movement on ltd wua (full made tough by ARDS deep sedation need.  P:   RASS goal: 0 Fentanyl and versed gtt stopped yesterday.  Not awake yet. Will get neuro involved to help wit  FAMILY  - Updates: no family at bedside 01/26/2015 - Inter-disciplinary family meet or Palliative Care meeting due by: 8/16   CODE    Code Status Orders        Start     Ordered   02/09/2015 1611  Do not attempt resuscitation (DNR)   Continuous    Question:  Do not initiate new interventions  Answer:  Yes   01/13/2015 1610     TODAY'S SUMMARY:  refractory shock MODS.   ARDS, shock  and creat all better. But neuro status/recovery is unclear. Neuro consult and family meeting tomorrow.   The patient is critically ill with multiple organ systems failure and requires high complexity decision making for assessment and support, frequent evaluation and titration of therapies, application of advanced monitoring technologies and extensive interpretation of multiple databases.   Critical Care Time devoted to patient care services described in this note is  40  Minutes. This time reflects time of care of this signee Dr Vaughan Browner. This critical care time does not  reflect procedure time, or teaching time or supervisory time of PA/NP/Med student/Med Resident etc but could involve care discussion time    Marshell Garfinkel MD [facility] Pager [phone] If no answer or after 3pm call: [phone] 01/28/2015, 10:22 AM

## 2015-01-28 NOTE — Progress Notes (Signed)
ANTIBIOTIC CONSULT NOTE - FOLLOW UP  Pharmacy Consult for Zosyn Indication: sepsis, aspiration  No Known Allergies  Patient Measurements: Height: 5\' 7"  (170.2 cm) Weight: 266 lb 5.1 oz (120.8 kg) IBW/kg (Calculated) : 66.1  Vital Signs: Temp: 98.4 F (36.9 C) (08/16 0900) Temp Source: Core (Comment) (08/16 0800) BP: 124/49 mmHg (08/16 0840) Pulse Rate: 63 (08/16 0900) Intake/Output from previous day: 08/15 0701 - 08/16 0700 In: 3423.7 [I.V.:2734.7; NG/GT:339; IV Piggyback:350] Out: 2165 [Urine:1965; Emesis/NG output:100; Stool:100] Intake/Output from this shift: Total I/O In: 236.7 [I.V.:186.7; NG/GT:50] Out: 75 [Urine:75]  Labs:  Recent Labs  01/26/15 0510 01/27/15 0411 01/28/15 0400  WBC 12.3* 12.6* 14.6*  HGB 8.1* 8.0* 8.1*  PLT 88* 74* 82*  CREATININE 4.64* 4.13* 3.26*   Estimated Creatinine Clearance: 25.5 mL/min (by C-G formula based on Cr of 3.26). No results for input(s): VANCOTROUGH, VANCOPEAK, VANCORANDOM, GENTTROUGH, GENTPEAK, GENTRANDOM, TOBRATROUGH, TOBRAPEAK, TOBRARND, AMIKACINPEAK, AMIKACINTROU, AMIKACIN in the last 72 hours.   Microbiology: Recent Results (from the past 720 hour(s))  Blood culture (routine x 2)     Status: None (Preliminary result)   Collection Time: 02/12/2015 10:09 AM  Result Value Ref Range Status   Specimen Description BLOOD RIGHT ANTECUBITAL  Final   Special Requests BOTTLES DRAWN AEROBIC AND ANAEROBIC 3 ML  Final   Culture   Final    NO GROWTH 4 DAYS Performed at [facility]    Report Status PENDING  Incomplete  Blood culture (routine x 2)     Status: None (Preliminary result)   Collection Time: 01/31/2015 10:48 AM  Result Value Ref Range Status   Specimen Description BLOOD BLOOD LEFT ARM  Final   Special Requests BOTTLES DRAWN AEROBIC AND ANAEROBIC  Final   Culture   Final    NO GROWTH 4 DAYS Performed at [facility]    Report Status PENDING  Incomplete  MRSA PCR Screening     Status: Abnormal   Collection Time: 01/20/2015  2:45 PM  Result Value Ref Range Status   MRSA by PCR POSITIVE (A) NEGATIVE Final    Comment:        The GeneXpert MRSA Assay (FDA approved for NASAL specimens only), is one component of a comprehensive MRSA colonization surveillance program. It is not intended to diagnose MRSA infection nor to guide or monitor treatment for MRSA infections. RESULT CALLED TO, READ BACK BY AND VERIFIED WITH: FAPFAW A. AT 1700 ON 02/03/2015 BY INGLEE   Culture, respiratory (NON-Expectorated)     Status: None   Collection Time: 01/29/2015  5:07 PM  Result Value Ref Range Status   Specimen Description TRACHEAL ASPIRATE  Final   Special Requests NONE  Final   Gram Stain   Final    RARE WBC PRESENT, PREDOMINANTLY PMN RARE SQUAMOUS EPITHELIAL CELLS PRESENT FEW GRAM POSITIVE COCCI IN PAIRS IN CHAINS Performed at [facility]    Culture   Final    Non-Pathogenic Oropharyngeal-type Flora Isolated. Performed at [facility]    Report Status 01/26/2015 FINAL  Final  C difficile quick scan w PCR reflex     Status: None   Collection Time: 01/26/15  3:55 PM  Result Value Ref Range Status   C Diff antigen NEGATIVE NEGATIVE Final   C Diff toxin NEGATIVE NEGATIVE Final   C Diff interpretation Negative for toxigenic C. difficile  Final    Anti-infectives    Start     Dose/Rate Route Frequency Ordered Stop   01/28/15  1400  piperacillin-tazobactam (ZOSYN) IVPB 3.375 g     3.375 g 12.5 mL/hr over 240 Minutes Intravenous 3 times per day 01/28/15 1015     01/24/15 1100  piperacillin-tazobactam (ZOSYN) IVPB 2.25 g  Status:  Discontinued     2.25 g 100 mL/hr over 30 Minutes Intravenous Every 6 hours 01/24/15 0802 01/28/15 1013   01/17/2015 1430  vancomycin (VANCOCIN) IVPB 1000 mg/200 mL premix  Status:  Discontinued     1,000 mg 200 mL/hr over 60 Minutes Intravenous Every 48 hours 02/07/2015 1415 01/24/15 0802   01/16/2015 1430  piperacillin-tazobactam (ZOSYN) IVPB 2.25 g   Status:  Discontinued     2.25 g 100 mL/hr over 30 Minutes Intravenous 3 times per day 02/02/2015 1415 01/24/15 0802   01/26/2015 1400  piperacillin-tazobactam (ZOSYN) IVPB 3.375 g  Status:  Discontinued     3.375 g 100 mL/hr over 30 Minutes Intravenous  Once 01/16/2015 1349 02/07/2015 1415   01/19/2015 1400  vancomycin (VANCOCIN) IVPB 1000 mg/200 mL premix  Status:  Discontinued     1,000 mg 200 mL/hr over 60 Minutes Intravenous  Once 02/09/2015 1349 01/29/2015 1415   01/31/2015 1015  vancomycin (VANCOCIN) IVPB 1000 mg/200 mL premix     1,000 mg 200 mL/hr over 60 Minutes Intravenous  Once 01/31/2015 1003 01/20/2015 1236   01/26/2015 1015  piperacillin-tazobactam (ZOSYN) IVPB 4.5 g  Status:  Discontinued     4.5 g 200 mL/hr over 30 Minutes Intravenous  Once 02/05/2015 1003 01/29/2015 1005   02/07/2015 1015  piperacillin-tazobactam (ZOSYN) IVPB 3.375 g     3.375 g 100 mL/hr over 30 Minutes Intravenous STAT 01/22/2015 1006 02/05/2015 1236      Assessment: 74 y.o. male presented to the ED on 8/12 from NH facility with nausea, vomiting and diarrhea for 2 days, he got phenergan last PM, no further vomiting, but ongoing diarrhea, with dark brown emesis, distended abdomen, and hypotensive. EMS gave him a bolus of fluid enroute. WBC 20K, UA shows probable UTI. 2 view abdomen showed interval SBO, CXR shows low lung volumes. During work up he has had a respiratory arrest, possible from aspiration, and is now intubated. He was down for 17 minutes. He is intubated, hypotensive. Pharmacy consulted to dose vancomycin and zosyn for sepsis.   Today, 01/28/2015:  Tmax 99.1  Wbc 14.8  Scr decreasing, 3.26 this am, Cl ~ 20N  8/11 >>vanc  >> 8/13 8/11 >>zosyn  >>    8/11 blood:x2: ngtd 8/11 TA: normal flora-final 8/11 MRSA PCR (+) on chlx and bactroban 8/14 CDiff: Ag neg & Toxin neg  Goal of Therapy:  Appropriate antibiotic for treatment, appropriate dose/schedule for renal function  Plan:   Day 6 Zosyn: adjust dose to  3.375gm q8hr- 4 hr infusion  Otho Bellows PharmD Pager [phone] 01/28/2015, 10:34 AM

## 2015-01-28 NOTE — Progress Notes (Signed)
EEG completed, results pending. 

## 2015-01-28 NOTE — Progress Notes (Signed)
Nutrition Follow-up  DOCUMENTATION CODES:   Obesity unspecified  INTERVENTION:  - Will change TF regimen: Vital AF 1.2 @ 40 mL/hr with 60 mL Prostat BID. This will provide 1552 kcal, 132 grams protein (98% minimal protein needs), and 778 mL free water.  - Will order multivitamin as goal TF rate will not meet micronutrient needs. - Will order 200 mL free water TID (Q8h) to provide additional 600 mL free water. - RD will continue to monitor for needs and changes needed to TF regimen.  NUTRITION DIAGNOSIS:   Increased nutrient needs related to acute illness as evidenced by estimated needs. - ongoing  GOAL:   Provide needs based on ASPEN/SCCM guidelines -ongoing, unmet with trickle feeds  MONITOR:   Vent status, Labs, Weight trends, TF tolerance, Skin, I & O's  ASSESSMENT:   74 year old male w/ h/o rather functional dementia. Resides at [facility]. Presented to the ER on 8/11 w/ N/V/D x 2 days. Became progressively hypotensive so EMS called. On ER arrival SBP was in 80s, creatinine was 5.53 and had lactic acid of 3.08. He was started on abx, given IV hydration and his lactic acid initially cleared. He was undergoing CT scan evaluation to work up SBO. While flat in CT scan he vomited, then developed hypoxia and subsequent cardio-pulmonary arrest. His time to ROSC was 17 minutes. He was intubated. Remained hypotensive. PCCM asked to admit w/ working dx of SBO, aspiration PNA and shock.   8/16 Pt remains intubated and no family present again this AM.  Patient is currently intubated on ventilator support with OGT in place for TF. MV: 18.9 L/min Temp (24hrs), Avg:98.9 F (37.2 C), Min:98.2 F (36.8 C), Max:99.1 F (37.3 C) Propofol: none  Pt currently receiving Vital AF 1.2 @ 10 mL/hr and RN reports good tolerance, no issues. MD agreeable to advancement of TF rate. Needs recalculated based on current mVe.  Will change TF regimen as outlined above. Not currently meeting needs. Medications  reviewed. Labs reviewed; CBGs: 142-199 mg/dL, Na: 161 mmol/L, K: 3.2 mmol/L, Cl: 99 mmol/L, BUN/creatinine elevated, Ca: 7.7 mg/dL, GFR: 18.  Drips: Levophed @ 7 mcg/min. IVF: D10 @ 75 mL/hr which is providing 612 kcal.   8/15 - No family present for PTA information. - Pt on vent with ME: 18.1 L/min - No Propofol  Diet Order:  NPO  Skin:  Reviewed, no issues  Last BM:  8/16  Height:   Ht Readings from Last 1 Encounters:  02/11/2015  (1.702 m)    Weight:   Wt Readings from Last 1 Encounters:  01/28/15 266 lb 5.1 oz (120.8 kg)    Ideal Body Weight:  67.27 kg (kg)  BMI:  Body mass index is 41.7 kg/(m^2).  Estimated Nutritional Needs:   Kcal:  0960-4540  Protein:  135-145 grams  Fluid:  2.1L/day  EDUCATION NEEDS:   No education needs identified at this time     Trenton Gammon, RD, LDN Inpatient Clinical Dietitian Pager # [phone] After hours/weekend pager # [phone]

## 2015-01-28 NOTE — Progress Notes (Signed)
CSW continues to follow this pt from [facility] to assist with d/c planning needs. PN reviewed. Pt is unresponsive. MD notes GOC meeting to be arranged. CSW will remain available to assist with d/c planning, as needed.  Cori Razor LCSW [phone]

## 2015-01-28 NOTE — Procedures (Signed)
ELECTROENCEPHALOGRAM REPORT   Patient: Cody Vincent      Room #: 1226 [facility] Age: 74 y.o.        Sex: male Referring Physician: Dr Isaiah Serge Report Date:  01/28/2015        Interpreting Physician: Omelia Blackwater  History: ADRIAAN MALTESE is an 74 y.o. male s/p cardiac arrest, unresponsive off sedation x 24 hrs  Medications:  Scheduled: . antiseptic oral rinse  7 mL Mouth Rinse QID  . chlorhexidine gluconate  15 mL Mouth Rinse BID  . feeding supplement (PRO-STAT SUGAR FREE 64)  60 mL Per Tube BID  . [START ON 01/29/2015] feeding supplement (VITAL AF 1.2 CAL)  1,000 mL Per Tube Q24H  . fentaNYL (SUBLIMAZE) injection  100 mcg Intravenous Once  . fentaNYL (SUBLIMAZE) injection  50 mcg Intravenous Once  . free water  200 mL Per Tube 3 times per day  . insulin aspart  0-9 Units Subcutaneous 6 times per day  . levothyroxine  62.5 mcg Intravenous Daily  . midazolam  2 mg Intravenous Once  . multivitamin  5 mL Per Tube Daily  . pantoprazole (PROTONIX) IV  40 mg Intravenous QHS  . piperacillin-tazobactam (ZOSYN)  IV  3.375 g Intravenous 3 times per day    Conditions of Recording:  This is a 19 channel EEG carried out with the patient in the intubated unresponsive state. Currently off all sedation for > 24 hrs.   Description: EEG is notable for a marked suppression of the background rhythm. No focal slowing or epileptiform activity noted. Minimal reactivity to noxious stimuli is noted. Normal sleep architecture is not observed.  Hyperventilation was not performed. Intermittent photic stimulation was not performed.   IMPRESSION: Abnormal EEG due to the presence of marked suppression of the background rhythm. This can be seen in cases of hypoxic injury and can indicate a poor prognosis.    Elspeth Cho, DO [facility] [phone]  If 7pm- 7am, please page neurology on call as listed in AMION. 01/28/2015, 3:28 PM

## 2015-01-29 ENCOUNTER — Ambulatory Visit: Payer: 59 | Admitting: Cardiology

## 2015-01-29 LAB — RENAL FUNCTION PANEL
ALBUMIN: 1.4 g/dL — AB (ref 3.5–5.0)
ANION GAP: 9 (ref 5–15)
BUN: 61 mg/dL — AB (ref 6–20)
CALCIUM: 7.9 mg/dL — AB (ref 8.9–10.3)
CO2: 19 mmol/L — ABNORMAL LOW (ref 22–32)
Chloride: 104 mmol/L (ref 101–111)
Creatinine, Ser: 3.04 mg/dL — ABNORMAL HIGH (ref 0.61–1.24)
GFR calc Af Amer: 22 mL/min — ABNORMAL LOW (ref >60)
GFR, EST NON AFRICAN AMERICAN: 19 mL/min — AB (ref >60)
GLUCOSE: 187 mg/dL — AB (ref 65–99)
PHOSPHORUS: 3.6 mg/dL (ref 2.5–4.6)
Potassium: 3.4 mmol/L — ABNORMAL LOW (ref 3.5–5.1)
SODIUM: 132 mmol/L — AB (ref 135–145)

## 2015-01-29 LAB — MAGNESIUM: Magnesium: 1.9 mg/dL (ref 1.7–2.4)

## 2015-01-29 LAB — CBC WITH DIFFERENTIAL/PLATELET
BASOS PCT: 0 % (ref 0–1)
Basophils Absolute: 0 10*3/uL (ref 0.0–0.1)
EOS PCT: 1 % (ref 0–5)
Eosinophils Absolute: 0.2 10*3/uL (ref 0.0–0.7)
HEMATOCRIT: 26.1 % — AB (ref 39.0–52.0)
Hemoglobin: 9 g/dL — ABNORMAL LOW (ref 13.0–17.0)
LYMPHS PCT: 3 % — AB (ref 12–46)
Lymphs Abs: 0.7 10*3/uL (ref 0.7–4.0)
MCH: 29.4 pg (ref 26.0–34.0)
MCHC: 34.5 g/dL (ref 30.0–36.0)
MCV: 85.3 fL (ref 78.0–100.0)
MONO ABS: 1.7 10*3/uL — AB (ref 0.1–1.0)
MONOS PCT: 7 % (ref 3–12)
NEUTROS PCT: 89 % — AB (ref 43–77)
Neutro Abs: 21.3 10*3/uL — ABNORMAL HIGH (ref 1.7–7.7)
Platelets: 128 10*3/uL — ABNORMAL LOW (ref 150–400)
RBC: 3.06 MIL/uL — AB (ref 4.22–5.81)
RDW: 14.8 % (ref 11.5–15.5)
WBC: 23.9 10*3/uL — AB (ref 4.0–10.5)

## 2015-01-29 LAB — GLUCOSE, CAPILLARY
GLUCOSE-CAPILLARY: 165 mg/dL — AB (ref 65–99)
GLUCOSE-CAPILLARY: 137 mg/dL — AB (ref 65–99)
Glucose-Capillary: 137 mg/dL — ABNORMAL HIGH (ref 65–99)
Glucose-Capillary: 140 mg/dL — ABNORMAL HIGH (ref 65–99)

## 2015-01-29 MED ORDER — FENTANYL CITRATE (PF) 100 MCG/2ML IJ SOLN: 50.0000 ug | INTRAMUSCULAR | Status: DC | PRN

## 2015-01-29 MED ORDER — MIDAZOLAM HCL 2 MG/2ML IJ SOLN: 1.0000 mg | INTRAMUSCULAR | Status: DC | PRN

## 2015-01-29 MED ORDER — DEXTROSE 5 % IV SOLN
10.0000 mg/h | INTRAVENOUS | Status: DC
  Administered 2015-01-29: 10 mg/h via INTRAVENOUS
  Filled 2015-01-29: qty 10

## 2015-01-29 MED ORDER — MORPHINE BOLUS VIA INFUSION
5.0000 mg | INTRAVENOUS | Status: DC | PRN
Start: 2015-01-29 — End: 2015-01-30
  Administered 2015-01-29: 2 mg via INTRAVENOUS
  Administered 2015-01-29: 3 mg via INTRAVENOUS
  Administered 2015-01-29: 2 mg via INTRAVENOUS
  Administered 2015-01-29: 5 mg via INTRAVENOUS
  Filled 2015-01-29 (×5): qty 20

## 2015-01-29 MED ORDER — ONDANSETRON HCL 4 MG/2ML IJ SOLN
4.0000 mg | Freq: Four times a day (QID) | INTRAMUSCULAR | Status: DC | PRN
  Administered 2015-01-29: 4 mg via INTRAVENOUS
  Filled 2015-01-29: qty 2

## 2015-01-29 NOTE — Progress Notes (Signed)
Wasted 170 ml fentanyl drip.  Witness:  Gae Dry, RN.

## 2015-01-29 NOTE — Progress Notes (Signed)
eLink Physician-Brief Progress Note Patient Name: KERRON SEDANO DOB: 01-14-1941 MRN: [id number]   Date of Service  01/29/2015  HPI/Events of Note  Nausea, vomiting  eICU Interventions  zofran prn     Intervention Category Intermediate Interventions: Other:  Laniesha Das 01/29/2015, 2:27 AM

## 2015-01-29 NOTE — Progress Notes (Signed)
All sedation was stopped by nursing 01-28-16.  Patient's respirations are 38-39, bp 186/67, hr 63-apaced,.  Patient has been gagging and vomiting.  Ventilator rate is set at 35 bpm.  Ordered a fentanyl drip from the pharmacy and restarted fentanyl drip, order is still present on MAR.

## 2015-01-29 NOTE — Progress Notes (Addendum)
PULMONARY / CRITICAL CARE MEDICINE   Name: Cody Vincent MRN: [id number] DOB: 03/12/1941    ADMISSION DATE:  02/07/2015 CONSULTATION DATE:  8/11  REFERRING MD : EDP   CHIEF COMPLAINT:  S/p cardiac arrest, SBO, septic shock   INITIAL PRESENTATION:  74 year old male w/ h/o rather functional dementia. Resides at [facility]. Presented to the ER on 8/11 w/ N/V/D x 2 days. Became progressively hypotensive so EMS called. On ER arrival SBP was in 80s, creatinine was 5.53 and had lactic acid of 3.08. He was started on abx, given IV hydration and his lactic acid initially cleared. He was undergoing CT scan evaluation to work up SBO. While flat in CT scan he vomited, then developed hypoxia and subsequent cardio-pulmonary arrest. His time to ROSC was 17 minutes. He was intubated. Remained hypotensive. PCCM asked to admit w/ working dx of SBO, aspiration PNA and shock.   STUDIES:  Renal US 8/12>>> No Hydro  SIGNIFICANT EVENTS: 8/11: admitted-->while in CT scan suffered 17 min PEA arrest. Admitted to ICU in MODS & refractory shock requiring 3 pressors. Placed on Nimbex d/t PF ratio <200.  Met w/ daughter Lattie Haw. Discussed poor prognosis, made DNR but cont current level of care w/ no further escalation.  8/12: BP holding on levo/vaso and epi gtts. CVP @ goal. WBC ct trending down, creatinine appeared to leveled off, ordered Renal US.  01/25/2015 : 40% fio2. On nimbex , fent. Versed -> BIS 32. On epi and levophed gtt. Lactic acidosis still persists   SUBJECTIVE/OVERNIGHT/INTERVAL HX 01/29/15:  Of sedation since today 8/14. Slightly more responsive today.   VITAL SIGNS: Temp:  [98.2 F (36.8 C)-99.9 F (37.7 C)] 99.5 F (37.5 C) (08/17 0500) Pulse Rate:  [59-66] 63 (08/17 0758) Resp:  [26-37] 37 (08/17 0758) BP: (79-121)/(38-48) 79/38 mmHg (08/17 0758) SpO2:  [94 %-100 %] 94 % (08/17 0758) Arterial Line BP: (95-163)/(41-70) 163/70 mmHg (08/17 0700) FiO2 (%):  [40 %] 40 % (08/17 0758) Weight:  [263 lb 14.3  oz (119.7 kg)] 263 lb 14.3 oz (119.7 kg) (08/17 0428) HEMODYNAMICS:   VENTILATOR SETTINGS: Vent Mode:  [-] PRVC FiO2 (%):  [40 %] 40 % Set Rate:  [35 bmp] 35 bmp Vt Set:  [550 mL] 550 mL PEEP:  [5 cmH20] 5 cmH20 Plateau Pressure:  [13 cmH20-30 cmH20] 13 cmH20 INTAKE / OUTPUT:  Intake/Output Summary (Last 24 hours) at 01/29/15 1025 Last data filed at 01/29/15 0900  Gross per 24 hour  Intake 2401.41 ml  Output   3600 ml  Net -1198.59 ml    PHYSICAL EXAMINATION: General: Opens eyes to painful stimuli. HEENT:  Orally intubated PERRLA Cardiovascular:  RRR, No MRG Lungs: Basal crackles Abdomen:  Soft Musculoskeletal:  Intact  Skin:  Cool and mottled. Anasarca  LABS: PULMONARY  Recent Labs Lab 01/13/2015 1215 01/24/2015 1625 01/22/2015 1830 01/25/15 0310 01/27/15 0358  PHART 7.101* 7.221*  --  7.423 7.398  PCO2ART 58.3* 43.9  --  28.0* 31.2*  PO2ART 199* 60.2*  --  56.4* 68.6*  HCO3 17.7* 17.3*  --  17.9* 18.7*  TCO2 17.6 16.4  --  16.7 17.7  O2SAT 98.2 88.9 43.6 87.7 93.1    CBC  Recent Labs Lab 01/27/15 0411 01/28/15 0400 01/29/15 0409  HGB 8.0* 8.1* 9.0*  HCT 23.5* 23.4* 26.1*  WBC 12.6* 14.6* 23.9*  PLT 74* 82* 128*    COAGULATION  Recent Labs Lab 02/04/2015 1453  INR 1.53*    CARDIAC    Recent  Labs Lab 01/26/2015 0920 01/25/15 1001 01/26/15 0510  TROPONINI 0.03 0.17* 0.15*   No results for input(s): PROBNP in the last 168 hours.   CHEMISTRY  Recent Labs Lab 01/25/15 0500 01/26/15 0510 01/27/15 0411 01/28/15 0400 01/29/15 0409  NA 133* 131* 128* 127* 132*  K 3.7 3.4* 3.1* 3.2* 3.4*  CL 101 101 99* 99* 104  CO2 16* 18* 20* 19* 19*  GLUCOSE 277* 183* 198* 195* 187*  BUN 68* 65* 62* 61* 61*  CREATININE 5.04* 4.64* 4.13* 3.26* 3.04*  CALCIUM 6.8* 7.2* 7.5* 7.7* 7.9*  MG 1.2* 1.5* 1.6* 1.8 1.9  PHOS 3.4 2.9 2.7 3.2 3.6   Estimated Creatinine Clearance: 27.2 mL/min (by C-G formula based on Cr of 3.04).   LIVER  Recent Labs Lab  01/22/2015 0920  01/29/2015 1453  01/25/15 0500 01/26/15 0510 01/27/15 0411 01/28/15 0400 01/29/15 0409  AST 28  --   --   --   --   --   --  32  --   ALT 19  --   --   --   --   --   --  15*  --   ALKPHOS 88  --   --   --   --   --   --  111  --   BILITOT 1.3*  --   --   --   --   --   --  6.5*  --   PROT 6.9  --   --   --   --   --   --  4.3*  --   ALBUMIN 3.7  < >  --   < > 1.7* 1.6* 1.5* 1.5* 1.4*  INR  --   --  1.53*  --   --   --   --   --   --   < > = values in this interval not displayed.   INFECTIOUS  Recent Labs Lab 01/19/2015 1453 01/24/15 0550  01/25/15 0500 01/25/15 1001 01/26/15 0510 01/27/15 0411  LATICACIDVEN  --   --   < > 5.2* 3.3* 2.9* 2.0  PROCALCITON 13.23 49.11  --  36.62  --   --   --   < > = values in this interval not displayed.   ENDOCRINE CBG (last 3)   Recent Labs  01/29/15 0002 01/29/15 0325 01/29/15 0740  GLUCAP 140* 165* 137*   IMAGING  No results found. ASSESSMENT / PLAN:  PULMONARY OETT 8/11>>> A: Acute hypoxic and hypercarbic respiratory arrest (P/F ratio <200) -> aspn pna/severe ARDS On minimal vent setting today. Off 48h  nimbex on 01/25/15.   P:   ARDS protocol to continue Fent and versed gtt off since 8/14 Poor mental status is a barrier to weaning  CARDIOVASCULAR CVL right IJ CVL 8/11>>>> A:  Refractory Septic shock/MODS S/p cardiopulmonary PEA arrest (17 minutes to ROSC in spite of provider ACLS) H/o symptomatic bradycardia w/ PPM    On 1 pressor. Vaso D/Cd. Weaning down on the pressors  P:  Volume resuscitation completed; will keep CVP goal 8-12. Start diureses when off pressors Reduce fluids to [facility] 01/26/2015 MAP goal > 65 -On levophed, vasopressin    RENAL A:   Acute renal failure- Lactic acidosis No hydro renal US 01/24/15 Both improving rapidly  P:   KVO fluids Renal dose meds Cont strict I&O Work up hyponatremia with serum osms, TSH, Cortisol  GASTROINTESTINAL A:   N/V and abd pain SBO-->abd  softer .  Continued improvement in abd imaging.   P:   Tolerating tube feeds. Will advance rate. PPI   HEMATOLOGIC A:   Anemia of critical illness Mild coagulopathy in setting of sepsis Worsening thrombocytopenia  Likely due to sepsis and critical illness.  Low prob 4T score for HIT.  P:  Will stop heparin. HIT ab pending. SCDs for DVT prophylaxis. Trend CBC Transfuse for Hgb < 7   INFECTIOUS A:   Septic shock likely UT source +/- bacterial translocation from gut  Aspiration PNA  C.Diff test is negative. P:   BCx2 8/11>>> UC 8/11>>> Sputum: resp culture 8/11: few gpc pairs/chains>>> Abx: vanc 8/11>>> Zosyn 8/11>>>  ENDOCRINE A:   Hyperglycemia   P:   SSI protocol   NEUROLOGIC A:   Acute encephalopathy in setting of sepsis, acidosis and s/p cardiac arrest: Concerned about possible hypoxic injury   -having some pain response and movement on ltd wua (full made tough by ARDS deep sedation need.  P:   RASS goal: 0 Fentanyl and versed gtt stopped yesterday.  Not awake yet. EEG suggestive of anoxic brain injury vs metabolic encephalopathy.   FAMILY  - Updates: Family (daughter, brother) updated at bedside. They are aware that he is not waking up and may have anoxic brain injury. They are considering comfort measures and re confirmed his DNR status. - Inter-disciplinary family meet or Palliative Care meeting due by: 8/16   CODE    Code Status Orders        Start     Ordered   01/22/2015 1611  Do not attempt resuscitation (DNR)   Continuous    Question:  Do not initiate new interventions  Answer:  Yes   01/25/2015 1610     TODAY'S SUMMARY:  refractory shock MODS.   ARDS, shock  and creat all better. But neuro status/recovery is unclear. Family meeting today. They are considering comfort measures.  Addendum: Family has decided to make him comfort measures with withdrawal of care.  The patient is critically ill with multiple organ systems failure and requires  high complexity decision making for assessment and support, frequent evaluation and titration of therapies, application of advanced monitoring technologies and extensive interpretation of multiple databases.   Critical Care Time devoted to patient care services described in this note is  40  Minutes. This time reflects time of care of this signee Dr Vaughan Browner. This critical care time does not reflect procedure time, or teaching time or supervisory time of PA/NP/Med student/Med Resident etc but could involve care discussion time    Marshell Garfinkel MD [facility] Pager [phone] If no answer or after 3pm call: [phone] 01/29/2015, 10:25 AM

## 2015-01-29 NOTE — Progress Notes (Signed)
Subjective: Per RN patient appears more alert this morning. No overnight events.   Patients daughter is present, extensively discussed situation and potential outcomes and treatment options with her.   Objective: Current vital signs: BP 79/38 mmHg  Pulse 63  Temp(Src) 99.5 F (37.5 C) (Core (Comment))  Resp 37  Ht  (1.702 m)  Wt 119.7 kg (263 lb 14.3 oz)  BMI 41.32 kg/m2  SpO2 94% Vital signs in last 24 hours: Temp:  [98.2 F (36.8 C)-99.9 F (37.7 C)] 99.5 F (37.5 C) (08/17 0500) Pulse Rate:  [59-66] 63 (08/17 0758) Resp:  [26-37] 37 (08/17 0758) BP: (79-121)/(38-48) 79/38 mmHg (08/17 0758) SpO2:  [94 %-100 %] 94 % (08/17 0758) Arterial Line BP: (95-163)/(41-70) 163/70 mmHg (08/17 0700) FiO2 (%):  [40 %] 40 % (08/17 0758) Weight:  [119.7 kg (263 lb 14.3 oz)] 119.7 kg (263 lb 14.3 oz) (08/17 0428)  Intake/Output from previous day: 08/16 0701 - 08/17 0700 In: 2729.7 [I.V.:1869.7; NG/GT:760; IV Piggyback:100] Out: 3725 [Urine:3725] Intake/Output this shift: Total I/O In: -  Out: 350 [Urine:350] Nutritional status:    Neurologic Exam: Intubated. Eyes closed, will not open to voice, attempts to close eyes when tried to open. Not following commands. Not making eye contact Cranial Nerves: II: blink to threat not noted, pupils equal, round, reactive to light  III,IV, VI: ptosis not present, eyes midline, no gaze deviation. Doll's eyes positive V,VII: smile symmetric, + Corneal reflex bilaterally VIII: unable to test IX,X: cough and gag reflex present Motor: No spontaneous movement of extremities noted Sensory: Withdrawals RUE > LUE to noxious stimuli, weak withdrawal bilateral LE to noxious stimuli  Lab Results: Basic Metabolic Panel:  Recent Labs Lab 01/25/15 0500 01/26/15 0510 01/27/15 0411 01/28/15 0400 01/29/15 0409  NA 133* 131* 128* 127* 132*  K 3.7 3.4* 3.1* 3.2* 3.4*  CL 101 101 99* 99* 104  CO2 16* 18* 20* 19* 19*  GLUCOSE 277* 183* 198* 195*  187*  BUN 68* 65* 62* 61* 61*  CREATININE 5.04* 4.64* 4.13* 3.26* 3.04*  CALCIUM 6.8* 7.2* 7.5* 7.7* 7.9*  MG 1.2* 1.5* 1.6* 1.8 1.9  PHOS 3.4 2.9 2.7 3.2 3.6    Liver Function Tests:  Recent Labs Lab Feb 19, 2015 0920  01/25/15 0500 01/26/15 0510 01/27/15 0411 01/28/15 0400 01/29/15 0409  AST 28  --   --   --   --  32  --   ALT 19  --   --   --   --  15*  --   ALKPHOS 88  --   --   --   --  111  --   BILITOT 1.3*  --   --   --   --  6.5*  --   PROT 6.9  --   --   --   --  4.3*  --   ALBUMIN 3.7  < > 1.7* 1.6* 1.5* 1.5* 1.4*  < > = values in this interval not displayed.  Recent Labs Lab 2015-02-19 0920  LIPASE 22   No results for input(s): AMMONIA in the last 168 hours.  CBC:  Recent Labs Lab 01/25/15 0500 01/26/15 0510 01/27/15 0411 01/28/15 0400 01/29/15 0409  WBC 8.3 12.3* 12.6* 14.6* 23.9*  NEUTROABS 7.1 10.6* 10.6* 12.1* 21.3*  HGB 8.4* 8.1* 8.0* 8.1* 9.0*  HCT 25.1* 23.6* 23.5* 23.4* 26.1*  MCV 86.0 85.2 84.5 84.5 85.3  PLT 94* 88* 74* 82* 128*    Cardiac Enzymes:  Recent Labs Lab  01/31/2015 0920 01/25/15 1001 01/26/15 0510  TROPONINI 0.03 0.17* 0.15*    Lipid Panel: No results for input(s): CHOL, TRIG, HDL, CHOLHDL, VLDL, LDLCALC in the last 168 hours.  CBG:  Recent Labs Lab 01/28/15 1555 01/28/15 2003 01/29/15 0002 01/29/15 0325 01/29/15 0740  GLUCAP 165* 154* 140* 165* 137*    Microbiology: Results for orders placed or performed during the hospital encounter of 01/29/2015  Blood culture (routine x 2)     Status: None   Collection Time: 01/29/2015 10:09 AM  Result Value Ref Range Status   Specimen Description BLOOD RIGHT ANTECUBITAL  Final   Special Requests BOTTLES DRAWN AEROBIC AND ANAEROBIC 3 ML  Final   Culture   Final    NO GROWTH 5 DAYS Performed at [facility]    Report Status 01/28/2015 FINAL  Final  Blood culture (routine x 2)     Status: None   Collection Time: 01/18/2015 10:48 AM  Result Value Ref Range Status    Specimen Description BLOOD BLOOD LEFT ARM  Final   Special Requests BOTTLES DRAWN AEROBIC AND ANAEROBIC  Final   Culture   Final    NO GROWTH 5 DAYS Performed at [facility]    Report Status 01/28/2015 FINAL  Final  MRSA PCR Screening     Status: Abnormal   Collection Time: 02/10/2015  2:45 PM  Result Value Ref Range Status   MRSA by PCR POSITIVE (A) NEGATIVE Final    Comment:        The GeneXpert MRSA Assay (FDA approved for NASAL specimens only), is one component of a comprehensive MRSA colonization surveillance program. It is not intended to diagnose MRSA infection nor to guide or monitor treatment for MRSA infections. RESULT CALLED TO, READ BACK BY AND VERIFIED WITH: FAPFAW A. AT 1700 ON 01/27/2015 BY INGLEE   Culture, respiratory (NON-Expectorated)     Status: None   Collection Time: 02/02/2015  5:07 PM  Result Value Ref Range Status   Specimen Description TRACHEAL ASPIRATE  Final   Special Requests NONE  Final   Gram Stain   Final    RARE WBC PRESENT, PREDOMINANTLY PMN RARE SQUAMOUS EPITHELIAL CELLS PRESENT FEW GRAM POSITIVE COCCI IN PAIRS IN CHAINS Performed at [facility]    Culture   Final    Non-Pathogenic Oropharyngeal-type Flora Isolated. Performed at [facility]    Report Status 01/26/2015 FINAL  Final  C difficile quick scan w PCR reflex     Status: None   Collection Time: 01/26/15  3:55 PM  Result Value Ref Range Status   C Diff antigen NEGATIVE NEGATIVE Final   C Diff toxin NEGATIVE NEGATIVE Final   C Diff interpretation Negative for toxigenic C. difficile  Final    Coagulation Studies: No results for input(s): LABPROT, INR in the last 72 hours.  Imaging: No results found.  Medications:  Scheduled: . antiseptic oral rinse  7 mL Mouth Rinse QID  . chlorhexidine gluconate  15 mL Mouth Rinse BID  . feeding supplement (PRO-STAT SUGAR FREE 64)  60 mL Per Tube BID  . feeding supplement (VITAL AF 1.2 CAL)  1,000 mL Per Tube  Q24H  . fentaNYL (SUBLIMAZE) injection  100 mcg Intravenous Once  . fentaNYL (SUBLIMAZE) injection  50 mcg Intravenous Once  . free water  200 mL Per Tube 3 times per day  . insulin aspart  0-9 Units Subcutaneous 6 times per day  . levothyroxine  62.5 mcg Intravenous  Daily  . midazolam  2 mg Intravenous Once  . multivitamin  5 mL Per Tube Daily  . pantoprazole (PROTONIX) IV  40 mg Intravenous QHS  . piperacillin-tazobactam (ZOSYN)  IV  3.375 g Intravenous 3 times per day    Assessment/Plan:  74y/o gentleman hx of dementia admitted on 8/11 after cardiac arrest with around 17 minutes before ROSC. Hospital course complicated by septic shock and respiratory arrest. Currently off sedation > 48hrs with minimal improvement in mental status. Suspect underlying metabolic/infectious process is playing a role in his depressed mental status but cannot rule out hypoxic injury. With his baseline dementia have guarded prognosis for a meaningful neurological recovery at this time.  Will need family meeting to discuss goals of care going forward. No further neurological workup indicated at this time. Will follow up as needed.    LOS: 6 days   Elspeth Cho, DO [facility] [phone]  If 7pm- 7am, please page neurology on call as listed in AMION. 01/29/2015  10:20 AM

## 2015-01-29 NOTE — Plan of Care (Signed)
Problem: Phase I Progression Outcomes Goal: Patient tolerating nututrition at goal Outcome: Not Progressing Vomiting in early am.  NG tube placed to intermittent suction by previous RN and 1000 ml removed.

## 2015-01-29 NOTE — Progress Notes (Signed)
While giving pt a bath (pt was turned on her side, flat, tube feedings were on hold) pt vomited a small amount. Pt's mouth was suctioned, NG tube placement and residual was checked, and MD was notified. MD added order for prn zofran which was given to the patient. No further orders at this time.

## 2015-01-29 NOTE — Progress Notes (Signed)
Family and pastor at the bedside. Transitioning to comfort care.

## 2015-01-29 NOTE — Progress Notes (Signed)
Pt extubated per withdrawal guide lines/ md order.

## 2015-01-30 LAB — HEPARIN INDUCED PLATELET AB (HIT ANTIBODY): Heparin Induced Plt Ab: 0.212 OD (ref 0.000–0.400)

## 2015-01-30 MED ORDER — SCOPOLAMINE 1 MG/3DAYS TD PT72
1.0000 | MEDICATED_PATCH | TRANSDERMAL | Status: DC
  Administered 2015-01-30: 1.5 mg via TRANSDERMAL
  Filled 2015-01-30: qty 1

## 2015-02-13 NOTE — Progress Notes (Signed)
160cc Morphine gtt wasted in sink with Reuel Boom, RN.   Starla Link, RN

## 2015-02-13 NOTE — Progress Notes (Signed)
Pt's dtr, Misty Stanley, contacted by telephone and notified of pt's death. She does not know what funeral home she wants to use at this time. Provided her with [facility] phone # and told her she should contact [facility] when she decides on a funeral home. Emotional support provided via telephone and all questions/concerns addressed.   Starla Link, RN

## 2015-02-13 NOTE — Progress Notes (Signed)
Pt expired @ 0330. Heart and lung sounds auscultated for 1 full minute by myself and Reuel Boom, RN. No heart or lung sounds heard. Pt w/ pacemaker, deactivated by magnet per Pola Corn MD Nicholos Johns. Attempted to call pt's dtr, Misty Stanley, x2; message left to call nsg unit. Pt's brother, Carney Bern, and sister in law, Lanora Manis, notified by phone of pt's death. They declined coming to hospital at time of call and did not know Lisa's wishes regarding funeral home services. Will continue to try to contact pt's dtr, Misty Stanley. [facility]S notified of TOD, pt is not a potential donor.    Starla Link, RN

## 2015-02-13 DEATH — deceased

## 2015-02-19 ENCOUNTER — Telehealth: Payer: Self-pay

## 2015-02-19 NOTE — Telephone Encounter (Signed)
Received death certificate from [facility] 02-19-15 for Dr. Mannam.  Will hold and send to [facility] via courier in the morning as he is off this afternoon.  Received back 02-21-15 and called [facility] for pick-up. °

## 2015-02-25 ENCOUNTER — Telehealth: Payer: Self-pay

## 2015-02-25 NOTE — Telephone Encounter (Signed)
On 02/25/2015 I received a death certificate from the health dept. There was a error on the first one signed by Doctor Maneem.  The 2nd death certificate will be taken to the [address] unit of [facility] for signature first thing tomorrow am. On 03/02/2015 I received the death certificate back from Doctor Maneem. I got the death certificate ready for pickup and called the health dept to let them know it was ready for pickup.

## 2015-02-27 NOTE — Telephone Encounter (Signed)
This note was entered by mistake. °

## 2015-03-15 NOTE — Discharge Summary (Signed)
Name:Cody Vincent ZOX:[id number] DOB:May 08, 1941   CHIEF COMPLAINT: S/p cardiac arrest, SBO, septic shock   Date of admission: 2015/02/18 Date of death: 02/25/2015  INITIAL PRESENTATION:  74 year old male w/ h/o rather functional dementia. Resides at [facility]. Presented to the ER on 02-18-2023 w/ N/V/D x 2 days. Became progressively hypotensive so EMS called. On ER arrival SBP was in 80s, creatinine was 5.53 and had lactic acid of 3.08. He was started on abx, given IV hydration and his lactic acid initially cleared. He was undergoing CT scan evaluation to work up SBO. While flat in CT scan he vomited, then developed hypoxia and subsequent cardio-pulmonary arrest. His time to ROSC was 17 minutes. He was intubated.   Hospital course was complicated by septic shock and respiratory failure, ARDS. He was eventually taken off all sedation for greater than 48 hours with minimal improvement in mental status. EEG showed marked suppression of background rhythm which may be seen in hypoxic brain injury. Patient had a poor prognosis for recovery.  Family meeting was held with daughter and brother. They agreed that he would not want to be supported like this. They decided to make him comfort measures with withdrawal of care and terminal extubation.  Chilton Greathouse MD [facility] Pager [phone] If no answer or after 3pm call: [phone] 03/05/2015, 10:15 AM

## 2016-01-07 ENCOUNTER — Ambulatory Visit: Payer: Medicaid Other | Admitting: Neurology

## 2016-01-29 IMAGING — DX DG CHEST 2V
2 series · 2 of 2 positions shown · non-contrast
Comparison: 03/20/2013

CLINICAL DATA: Shortness of breath and weakness

EXAM:
CHEST  2 VIEW

[chest pa]
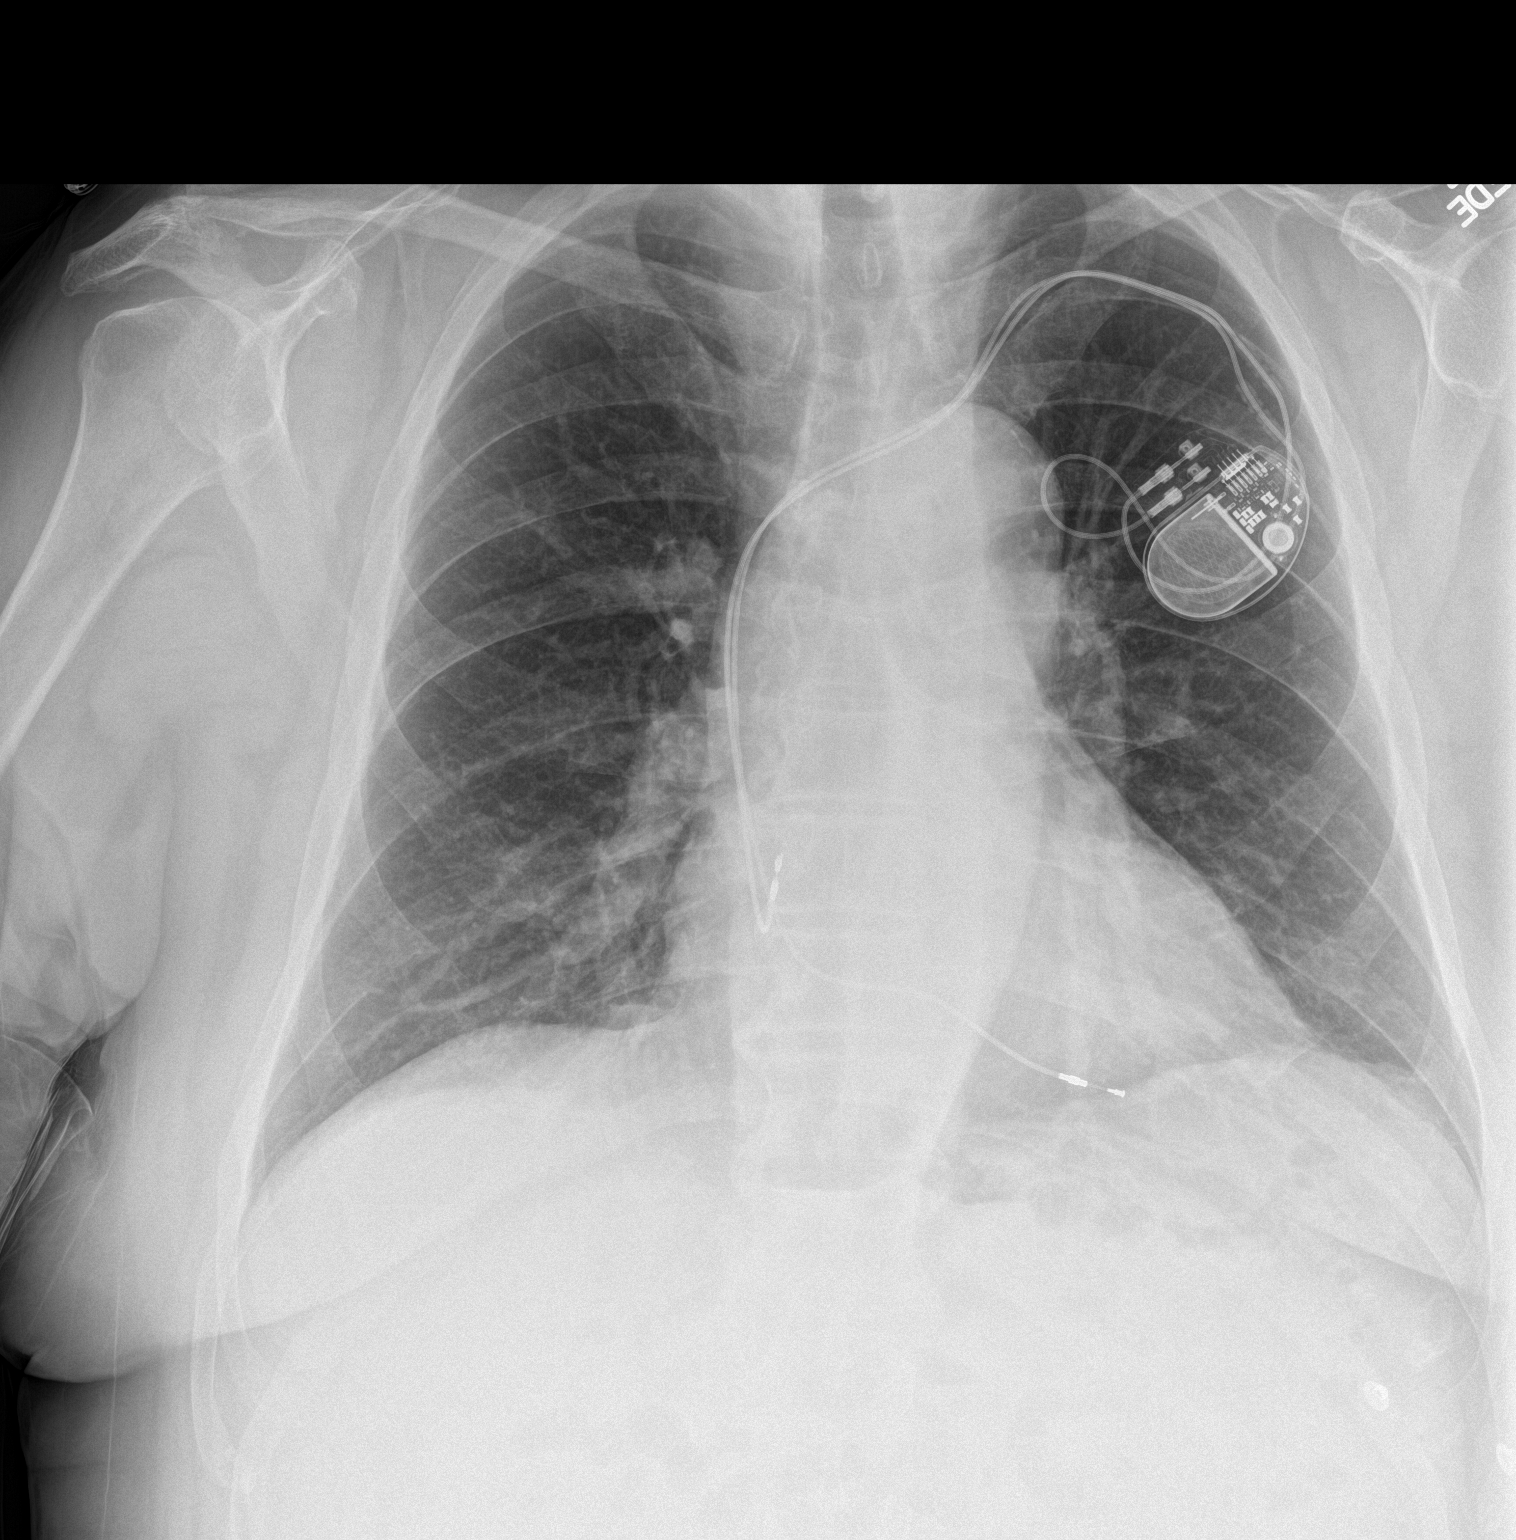

[chest lat]
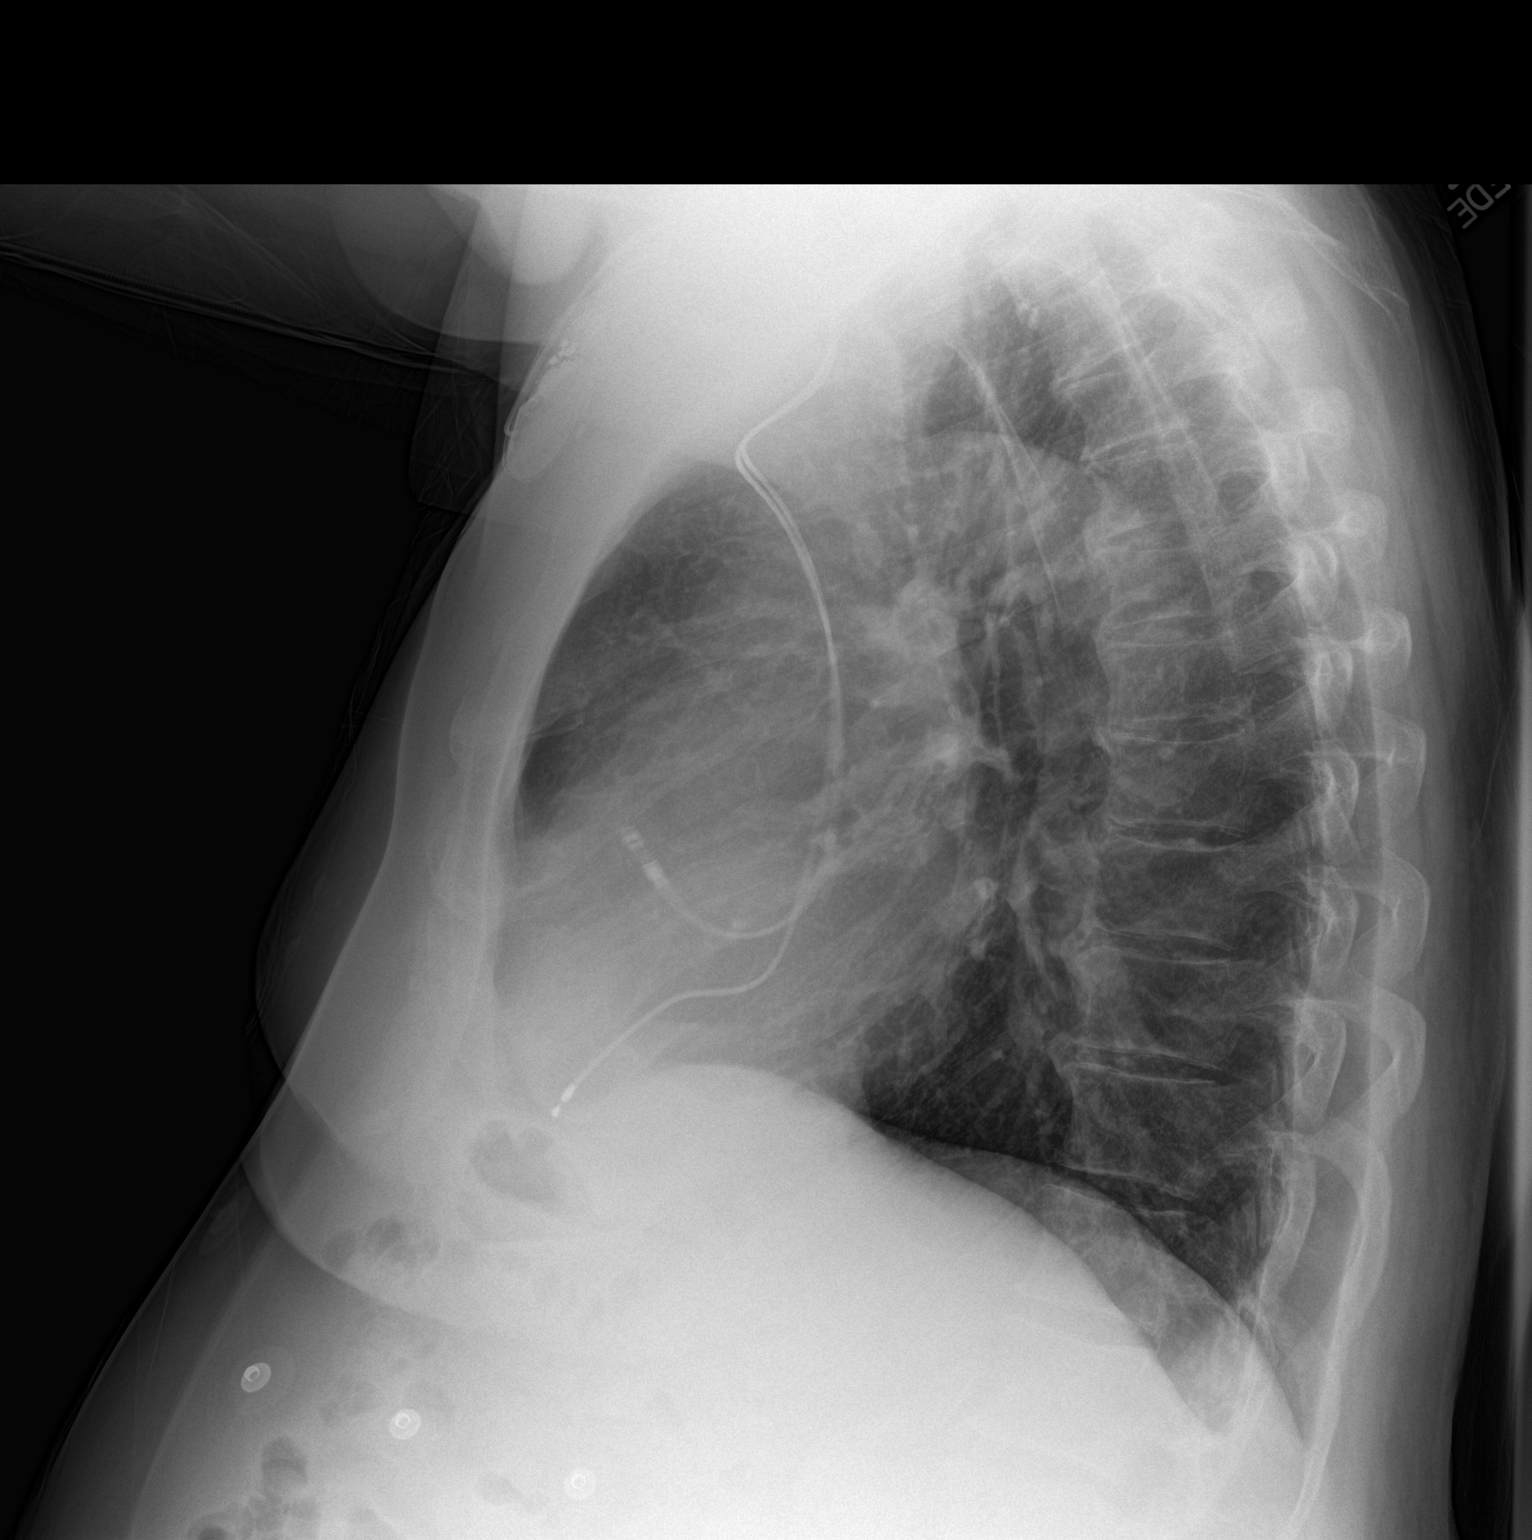

[2 of 2 positions shown; findings below may reference images not displayed]

FINDINGS: No cardiomegaly. Mild aortic tortuosity stable. Dual-chamber pacer
leads from the left are in stable position. There is no edema,
consolidation, effusion, or pneumothorax. No acute osseous findings.
IMPRESSION: Stable exam.  No acute cardiopulmonary disease.

## 2016-08-01 IMAGING — DX DG ABD PORTABLE 1V
3 series · 4 of 4 positions shown · non-contrast
Comparison: 01/24/2015

CLINICAL DATA: Nasogastric tube.  Followup small bowel obstruction.

EXAM:
PORTABLE ABDOMEN - 1 VIEW

[Series 1: abdomen kub · 0.14mm/px · 2 of 2 slices shown (1 of 3)]
[im 1/2]
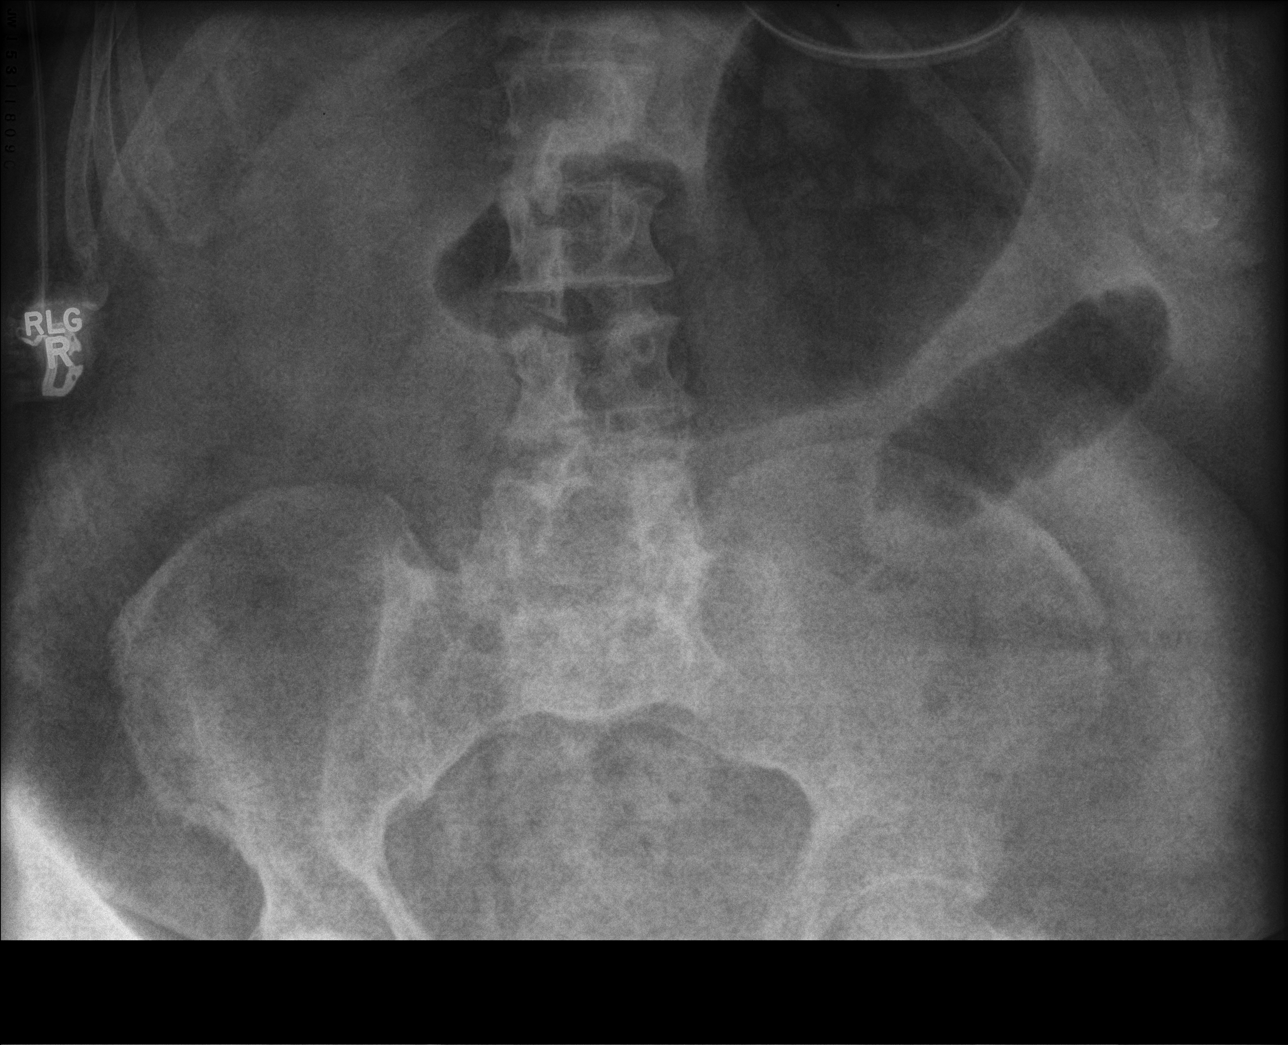
[im 2/2]
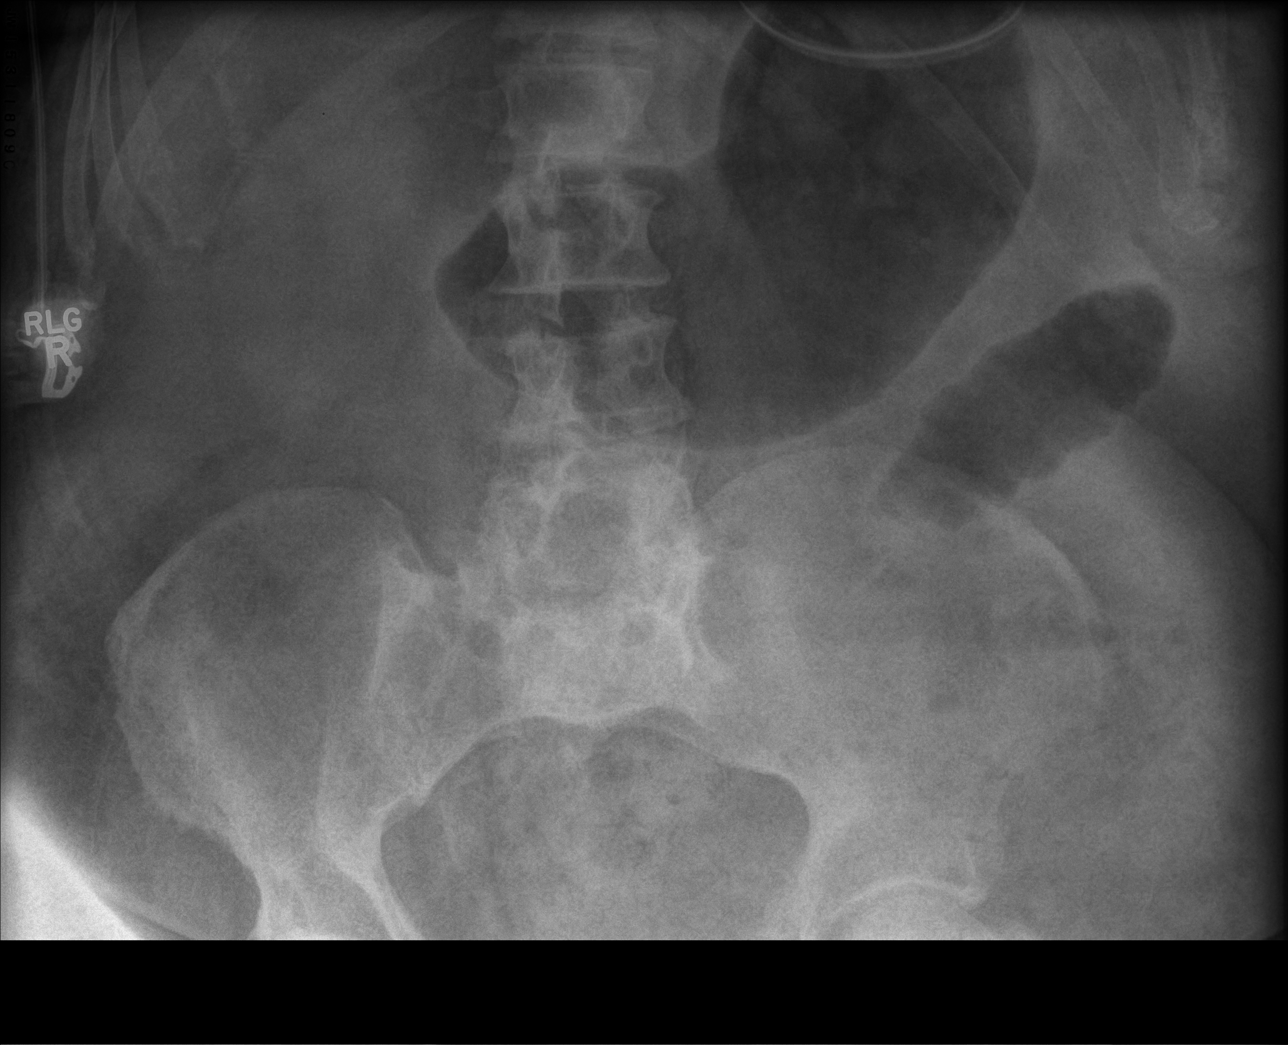

[abdomen kub (2 of 3)]
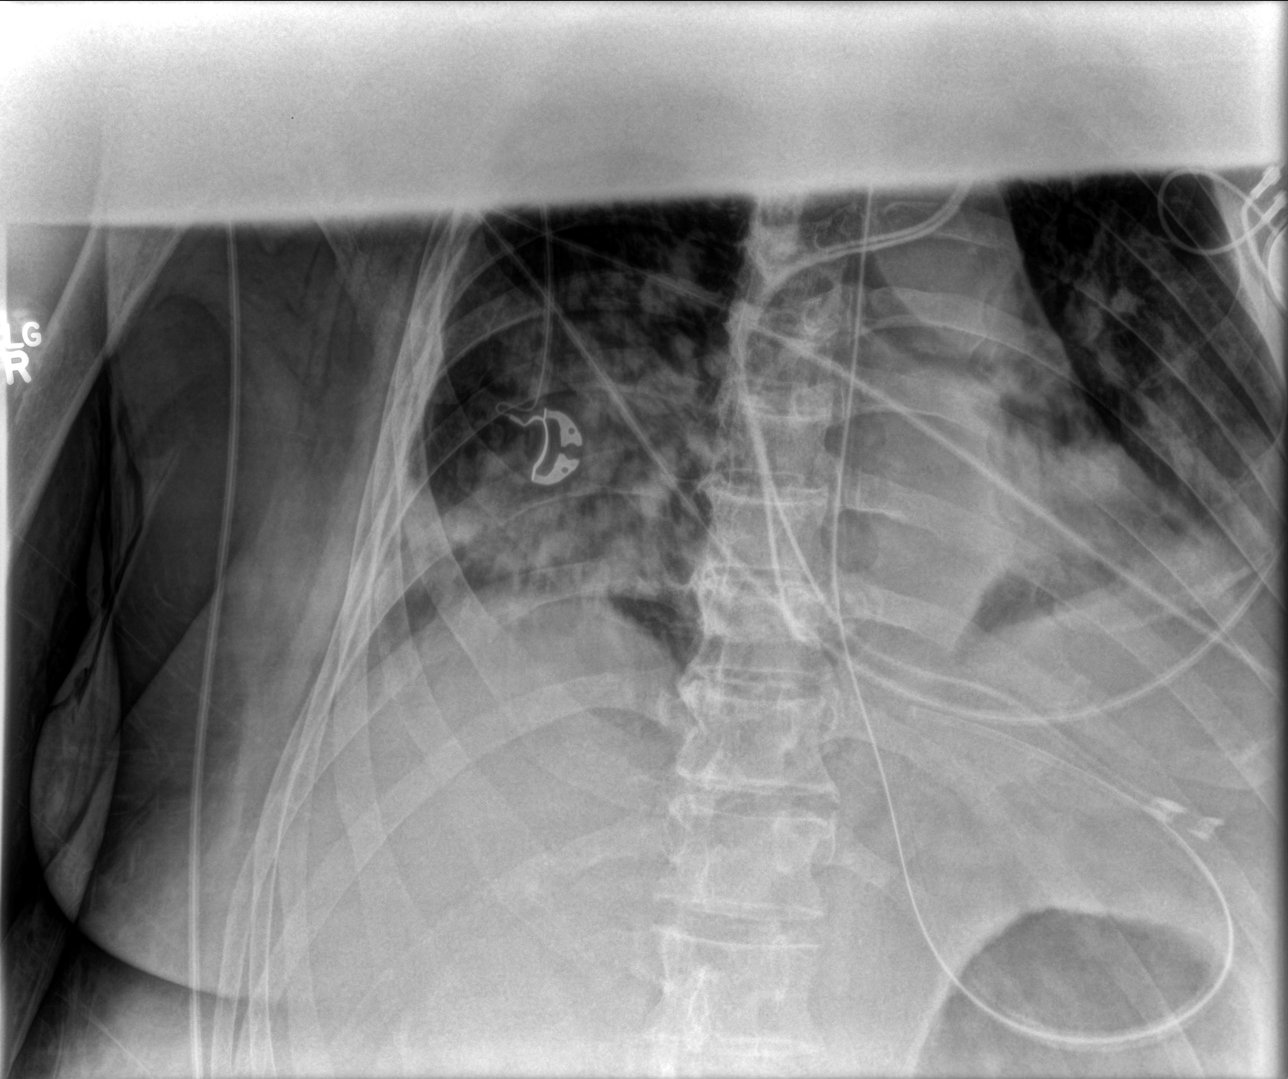

[abdomen kub (3 of 3)]
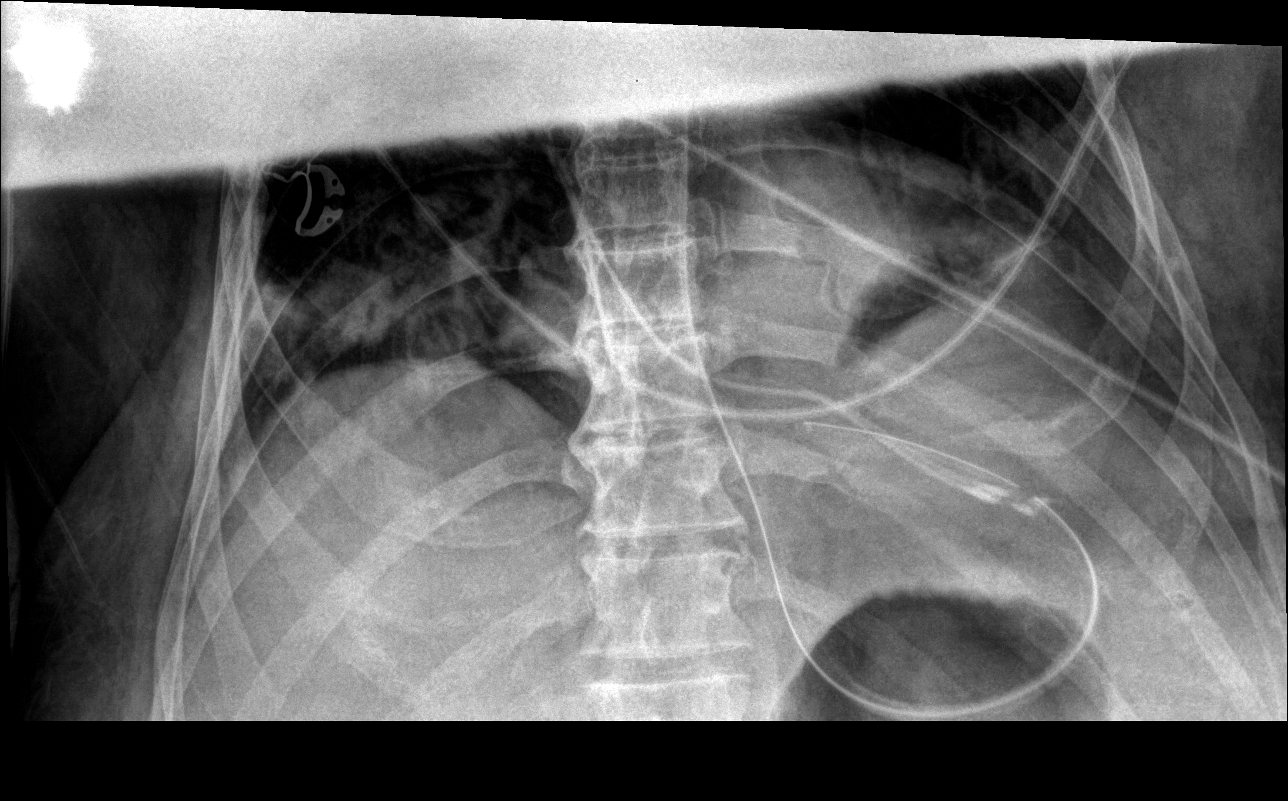

[4 of 4 positions shown; findings below may reference images not displayed]

FINDINGS: Nasogastric tube tip remains coiled into the fundus of the stomach.

Continued improvement in bowel gas pattern, now with solitary
moderately distended loop seen in the left abdomen. Few bubbles of
gas are distributed throughout the abdomen, which could be within
predominately fluid dilated bowel. No definitive pneumatosis. No
concerning intra-abdominal mass effect or calcification.

Bibasilar lung opacities, known based on dedicated chest
radiography.
IMPRESSION: Continued clearing of gas dilated small bowel.
